# Patient Record
Sex: Female | Born: 1950 | Race: White | Hispanic: No | Marital: Married | Smoking: Former smoker
Health system: Southern US, Community
[De-identification: ages and names within clinical notes are randomized; demographics above are authoritative.]

## PROBLEM LIST (undated history)

## (undated) DIAGNOSIS — G35 Multiple sclerosis: Secondary | ICD-10-CM

## (undated) DIAGNOSIS — R55 Syncope and collapse: Secondary | ICD-10-CM

## (undated) DIAGNOSIS — I5030 Unspecified diastolic (congestive) heart failure: Secondary | ICD-10-CM

## (undated) DIAGNOSIS — G43909 Migraine, unspecified, not intractable, without status migrainosus: Secondary | ICD-10-CM

## (undated) DIAGNOSIS — G4730 Sleep apnea, unspecified: Secondary | ICD-10-CM

## (undated) DIAGNOSIS — R911 Solitary pulmonary nodule: Secondary | ICD-10-CM

## (undated) DIAGNOSIS — R079 Chest pain, unspecified: Secondary | ICD-10-CM

## (undated) DIAGNOSIS — H269 Unspecified cataract: Secondary | ICD-10-CM

## (undated) DIAGNOSIS — M1990 Unspecified osteoarthritis, unspecified site: Secondary | ICD-10-CM

## (undated) DIAGNOSIS — M353 Polymyalgia rheumatica: Secondary | ICD-10-CM

## (undated) DIAGNOSIS — G709 Myoneural disorder, unspecified: Secondary | ICD-10-CM

## (undated) DIAGNOSIS — G35D Multiple sclerosis, unspecified: Secondary | ICD-10-CM

## (undated) HISTORY — DX: Unspecified osteoarthritis, unspecified site: M19.90

## (undated) HISTORY — DX: Multiple sclerosis, unspecified: G35.D

## (undated) HISTORY — DX: Unspecified diastolic (congestive) heart failure: I50.30

## (undated) HISTORY — DX: Chest pain, unspecified: R07.9

## (undated) HISTORY — PX: TUBAL LIGATION: SHX77

## (undated) HISTORY — DX: Unspecified cataract: H26.9

## (undated) HISTORY — PX: BREAST EXCISIONAL BIOPSY: SUR124

## (undated) HISTORY — PX: EYE SURGERY: SHX253

## (undated) HISTORY — DX: Syncope and collapse: R55

## (undated) HISTORY — DX: Sleep apnea, unspecified: G47.30

## (undated) HISTORY — DX: Solitary pulmonary nodule: R91.1

## (undated) HISTORY — PX: CARDIAC CATHETERIZATION: SHX172

## (undated) HISTORY — PX: FRACTURE SURGERY: SHX138

## (undated) HISTORY — PX: TENDON REPAIR: SHX5111

## (undated) HISTORY — DX: Polymyalgia rheumatica: M35.3

## (undated) HISTORY — DX: Migraine, unspecified, not intractable, without status migrainosus: G43.909

## (undated) HISTORY — DX: Multiple sclerosis: G35

---

## 1988-10-01 HISTORY — PX: BREAST SURGERY: SHX581

## 2014-10-07 LAB — HM MAMMOGRAPHY: HM MAMMO: NEGATIVE

## 2014-12-05 ENCOUNTER — Observation Stay: Payer: Self-pay | Admitting: Internal Medicine

## 2015-01-30 NOTE — Discharge Summary (Signed)
PATIENT NAME:  Morgan Herrera, Morgan Herrera MR#:  [id number] DATE OF BIRTH:  03/21/1951  DATE OF ADMISSION:  12/05/2014 DATE OF DISCHARGE:  12/06/2014  PRESENTING COMPLAINT: Cat bite with swelling of the left middle finger.    DISCHARGE DIAGNOSES:   1.  Left hand cat bite of left hand with cellulitis.  2.  History of multiple sclerosis.   CONDITION ON DISCHARGE: Fair.  MEDICATIONS:   1.  Augmentin 875 b.i.d.  2.  Tecfidera 240 mg delayed release b.i.d.  3.  Prednisone 1 mg daily.   FOLLOWUP: With urgent care as needed. Keep left hand elevated. If redness increases, return back to emergency room.   LABORATORY DATA:    1.  White count is 13.4.    2.  Blood cultures negative.   BRIEF SUMMARY OF HOSPITAL COURSE: Ms. Raffo is a 64 year old Caucasian female with past medical history of multiple sclerosis, comes to the emergency room with: 1.  Cat bite on left hand with cellulitis and swelling. The patient was admitted on the medical floor, started on IV Unasyn. The patient was advised to keep her left hand elevated. Swelling improved. Redness also improved. The patient did not have any fever; her counts remained stable. She will finish up a course of Augmentin as outpatient and follow up to ER or urgent care as needed.  2.  History of MS. Her prednisone and Tecfidera were continued. Hospital stay otherwise remained stable.   CODE STATUS: The patient remained a full code.   TIME SPENT: 40 minutes.    ____________________________ Hart Rochester Posey Pronto, MD sap:tr D: 12/11/2014 15:22:00 ET T: 12/11/2014 16:03:49 ET JOB#: [id number]  cc: Clova Morlock A. Posey Pronto, MD, <Dictator> Ilda Basset MD ELECTRONICALLY SIGNED 12/12/2014 18:21

## 2015-01-30 NOTE — H&P (Signed)
PATIENT NAME:  Morgan Herrera, TAKEMOTO MR#:  [id number] DATE OF BIRTH:  [birth year]  DATE OF ADMISSION:  12/05/2014  CHIEF COMPLAINT: Cat bite to the left hand.   HISTORY OF PRESENT ILLNESS: This is a 64 year old female patient who is in the process of moving here to [place] from [place]. She was getting ready to get on her flight this morning. Around 7:00 a.m. she was putting her cat in the travel carrier and the cat was struggling with her, bit her left hand a couple of times, notably on her left middle finger as well as her left small finger. The patient was in the airport on flights traveling here all day. She said that she noted significant increasing pain and redness and swelling that started around 10:00 to 10:30 a.m. and then by the time she got here this afternoon, this had increased. She came into the ED and was evaluated for the same. She does have some limited range of motion with the swelling in that hand. No other complaints. In the ED, her workup was relatively normal. It showed only slight increase in her white blood cell count, 13.4. Her other labs were okay and largely within normal limits. The ED provider called orthopedics and they recommended that the patient be admitted overnight for up to 24 hours' worth of IV antibiotics for this problem.  PRIMARY CARE PHYSICIAN: The patient has just moved here so she does not have an established primary care physician at this time.   PAST MEDICAL HISTORY: Multiple sclerosis. The patient states this is mild; polymyalgia rheumatica; and some mild insomnia.  CURRENT MEDICATIONS: Prednisone 1 mg daily; Prempro every other day, the patient states she is trying to wean off of this; Lasix 10 mg daily; Tecfidera 240 mg b.i.d.; Ambien 2.5 mg at bedtime.   PAST SURGICAL HISTORY: Retina repair, right elbow tendon repair, right leg fracture repair.   ALLERGIES: No known drug allergies.   FAMILY HISTORY: Mother had Parkinson's and coronary artery  disease. Father had asthma.   SOCIAL HISTORY: The patient is an ex-smoker. She quit smoking 16 years ago. She drinks 1 glass of wine with dinner daily and denies illicit drug use.  REVIEW OF SYSTEMS:  CONSTITUTIONAL: Denies fever, fatigue, weakness.  EYES: Denies blurred or double vision, pain or redness.  EAR, NOSE, AND THROAT: Denies ear pain, hearing loss, difficulty swallowing.  RESPIRATORY: Endorses mild cough. Denies wheeze or dyspnea. Endorses mild laryngitis.  CARDIOVASCULAR: Denies chest pain, orthopnea, edema, or palpitations.  GASTROINTESTINAL: Denies nausea, vomiting, diarrhea, abdominal pain, or constipation.  GENITOURINARY: Denies dysuria, hematuria, frequency.  ENDOCRINE: Denies nocturia, thyroid problems, heat or cold intolerance. HEMATOLOGIC AND LYMPHATIC: Denies easy bruising, bleeding, or swollen glands.  INTEGUMENTARY: Denies acne lesions. Endorses left hand cellulitis around the areas of the cat bites, most notably the left middle finger and left pinky finger, as well as some swelling in those fingers.  MUSCULOSKELETAL: Denies arthritis, swelling, or gout.  NEUROLOGICAL: Denies numbness, weakness, headache.  PSYCHIATRIC: Denies anxiety, depression. Endorses some mild, stable insomnia.   PHYSICAL EXAMINATION:  CONSTITUTIONAL: Blood pressure 137/79, pulse 63, temperature 98.3, respirations 20, with 98% oxygen saturations on room air.  GENERAL: This is a well-nourished female supine in bed, in no apparent distress.  HEENT: Pupils equal, round, react to light and accommodation. Extraocular movements intact. No scleral icterus. Moist mucosal membranes.  NECK: Thyroid is not enlarged.  NECK: Supple with no masses. Nontender. No cervical adenopathy. No JVD.  RESPIRATORY: Clear to  auscultation bilaterally with no rales, rhonchi, or wheezes. No respiratory distress.  CARDIOVASCULAR: Regular rate and rhythm. No murmurs, rubs, or gallops auscultated. Good pedal pulses. No lower  extremity edema.  ABDOMEN: Soft, nontender, nondistended, with good bowel sounds and no hepatosplenomegaly.  MUSCULOSKELETAL: Muscular strength 5/5 with full spontaneous range of motion throughout all extremities. No distal clubbing or cyanosis. She does have swelling in her left middle finger and her left pinky finger. She does have 2 puncture bite marks on the left distal interphalangeal joint of the middle finger and also the distal interphalangeal joint of the pinky finger.  SKIN: She does have some erythema and cellulitis around those bite marks on her left hand and fingers. No other rash or lesions. Otherwise, skin is warm, dry, and intact.  LYMPHATIC: No adenopathy.  NEUROLOGIC: Cranial nerves intact. Sensation intact throughout. No dysarthria or aphasia.  PSYCHIATRIC: Alert and oriented x 3 with good judgment and insight; cooperative.   LABORATORY DATA: White blood count 13.4, hemoglobin 11.6, hematocrit 36.3, platelets 278,000. Sodium 142, potassium 3.8, chloride 105, bicarbonate 28, BUN 22, creatinine 0.54, glucose 104.   RADIOLOGY: No radiology to report.   ASSESSMENT AND PLAN: 1.  Cat bite to the left hand. The patient will be given intravenous Unasyn through tonight and into tomorrow, and her bite will be reassessed at that time to see if she can be transitioned to oral antibiotics.  2.  Multiple sclerosis. The patient reports that she is on Tecfidera. She also states that she wishes to utilize her own medications here in the hospital. We will admit her to observation overnight and, per nursing protocol, she may use her home medications if that is allowable here.  3.  Polymyalgia rheumatica. The patient states she is on the 1 mg of prednisone daily for this. We will continue that here inpatient, again using her own medication if that is allowed per protocol.  4.  Deep vein thrombosis prophylaxis. Mechanical sequential compression devices only.   CODE STATUS: This patient is full  code.  TIME SPENT ON THIS ADMISSION: 45 minutes.   ____________________________ Wilford Corner. Jannifer Franklin, MD dfw:ST D: 12/06/2014 00:03:55 ET T: 12/06/2014 01:18:55 ET JOB#: [id number]  cc: Wilford Corner. Jannifer Franklin, MD, <Dictator> Janaia Kozel Fawn Kirk MD ELECTRONICALLY SIGNED 12/06/2014 3:02

## 2015-02-11 ENCOUNTER — Encounter: Payer: Self-pay | Admitting: Primary Care

## 2015-02-11 ENCOUNTER — Ambulatory Visit (INDEPENDENT_AMBULATORY_CARE_PROVIDER_SITE_OTHER): Payer: 59 | Admitting: Primary Care

## 2015-02-11 VITALS — BP 116/72 | HR 49 | Temp 97.7°F | Ht 65.75 in | Wt 112.4 lb

## 2015-02-11 DIAGNOSIS — G35 Multiple sclerosis: Secondary | ICD-10-CM | POA: Diagnosis not present

## 2015-02-11 DIAGNOSIS — E8770 Fluid overload, unspecified: Secondary | ICD-10-CM | POA: Diagnosis not present

## 2015-02-11 DIAGNOSIS — F329 Major depressive disorder, single episode, unspecified: Secondary | ICD-10-CM | POA: Diagnosis not present

## 2015-02-11 DIAGNOSIS — R609 Edema, unspecified: Secondary | ICD-10-CM

## 2015-02-11 DIAGNOSIS — R600 Localized edema: Secondary | ICD-10-CM | POA: Diagnosis not present

## 2015-02-11 DIAGNOSIS — F32A Depression, unspecified: Secondary | ICD-10-CM | POA: Insufficient documentation

## 2015-02-11 MED ORDER — FUROSEMIDE 20 MG PO TABS: 20.0000 mg | ORAL_TABLET | Freq: Every day | ORAL | Status: DC

## 2015-02-11 NOTE — Assessment & Plan Note (Signed)
Diagnosed 14 years ago. Taking Tecfederia. Has some itching and rash to face. Manages with Dr. Lanae Boast in [place], [place].

## 2015-02-11 NOTE — Assessment & Plan Note (Signed)
Diagnosed 3 years ago. Managed on Lexapro 5 mg daily, feels well managed.  Denies SI/HI

## 2015-02-11 NOTE — Progress Notes (Signed)
Subjective:    Patient ID: Morgan Herrera, female    DOB: 26-Oct-1950, 64 y.o.   MRN: [id number]  HPI  Ms. Morgan Herrera is a 64 year old female who presents today to establish care and discuss the problems mentioned below. Will obtain old records.  1) Multiple Sclerosis: Mild. Diagnosed by CT scan about 14 years ago after presenting with optic neritis. Symptoms include tingling and itching to her face recently. Previous symptoms include tingling, fatigue, weakness. The symptoms will occur and pass within 1-2 weeks. Follows with Morgan Herrera in [place]. She is taking Tecfederia.   2) Polymyalgia Rheumatica: Diagnosed 4 years ago, was placed on steroids with resolve. Minor flare up's occasionally as she is working to taper off prednisone. She is currently taking 1 mg every other day. Following with Dr. Rush Herrera in [place], Rheumatology.  3) Depression: Diagnosed 3 years ago after her husband retired. She was seeing a therapist which helped her to work through some issues. She was placed on Lexapro 10  Mg tablets and is taking 1/2 tablet daily. She feels as though she's managed well and would like to continue her Lexapro.  4) Postmenopausal: Taking Prempro tablets, 1/2 every third day. She has been taking for 10 years and is slowly weaning herself off.  5) Water retention: Taking daily Lasix 20 mg for the past 5 years. Fluid retention in ankles. legs, and face occasionally. No known heart failure.   Review of Systems  Constitutional: Negative for unexpected weight change.  HENT:       Intermittent rhinorrhea.   Respiratory: Negative for cough and shortness of breath.   Cardiovascular: Negative for chest pain.  Gastrointestinal: Negative for diarrhea.       Constipation: bowel movements are once every 3 days.  Genitourinary: Negative for dysuria.  Musculoskeletal:       History of MS and PMR  Skin: Negative for rash.  Neurological: Negative for dizziness and headaches.    Tingling intermittently from MS.  Psychiatric/Behavioral:       See HPI       Past Medical History  Diagnosis Date  . Migraine   . PMR (polymyalgia rheumatica)   . Multiple sclerosis     History   Social History  . Marital Status: Married    Spouse Name: N/A  . Number of Children: N/A  . Years of Education: N/A   Occupational History  . Not on file.   Social History Main Topics  . Smoking status: Former Research scientist (life sciences)  . Smokeless tobacco: Not on file  . Alcohol Use: 0.0 oz/week    0 Standard drinks or equivalent per week     Comment: 1 to 2 glass of wine at least 4 nights a week  . Drug Use: No  . Sexual Activity: Not on file   Other Topics Concern  . Not on file   Social History Narrative   Married.   Moved here from [place] after 35 years   Enjoys arts and crafts. Taking a painting class.   Walking with her spouse.    Reading, shopping.    Past Surgical History  Procedure Laterality Date  . Breast surgery  1990  . Eye surgery      1978 1st of 3 eye surgeries  . Tendon repair      Family History  Problem Relation Age of Onset  . Heart disease Mother   . Hypertension Mother   . Cancer Maternal Grandmother  breast  . Parkinson's disease Mother   . Asthma Father     No Known Allergies  No current outpatient prescriptions on file prior to visit.   No current facility-administered medications on file prior to visit.    BP 116/72 mmHg  Pulse 49  Temp(Src) 97.7 F (36.5 C) (Oral)  Ht 5' 5.75" (1.67 m)  Wt 112 lb 6.4 oz (50.984 kg)  BMI 18.28 kg/m2  SpO2 99%    Objective:   Physical Exam  Constitutional: She is oriented to person, place, and time. She appears well-developed.  HENT:  Right Ear: Tympanic membrane and ear canal normal.  Left Ear: Tympanic membrane and ear canal normal.  Nose: Nose normal.  Mouth/Throat: Oropharynx is clear and moist.  Eyes: Conjunctivae and EOM are normal. Pupils are equal, round, and reactive to light.    Neck: Neck supple.  Cardiovascular: Normal rate and regular rhythm.   Pulmonary/Chest: Effort normal and breath sounds normal.  Abdominal: Soft. Bowel sounds are normal.  Musculoskeletal: Normal range of motion.  Lymphadenopathy:    She has no cervical adenopathy.  Neurological: She is alert and oriented to person, place, and time. She has normal reflexes.  Skin: Skin is warm and dry.  Psychiatric: She has a normal mood and affect.          Assessment & Plan:  30 minutes spent on new patient visit.

## 2015-02-11 NOTE — Patient Instructions (Signed)
Refills of your Lasix have been sent to your pharmacy.  Try Debrox drops over the counter for ear wax overproduction. Please schedule a physical with me in the next 1-2 months. You will also schedule a lab only appointment one week prior. We will discuss your lab results during your physical. It was a pleasure to meet you today! Please don't hesitate to call me with any questions. Welcome to [facility]!

## 2015-02-11 NOTE — Assessment & Plan Note (Signed)
Takes daily Lasix 20 mg and has been on for 5 years. Refills provided today. No edema present today.

## 2015-02-24 ENCOUNTER — Other Ambulatory Visit: Payer: Self-pay | Admitting: Primary Care

## 2015-02-24 DIAGNOSIS — R600 Localized edema: Secondary | ICD-10-CM

## 2015-02-24 DIAGNOSIS — Z0000 Encounter for general adult medical examination without abnormal findings: Secondary | ICD-10-CM

## 2015-02-24 DIAGNOSIS — Z780 Asymptomatic menopausal state: Secondary | ICD-10-CM

## 2015-03-01 ENCOUNTER — Other Ambulatory Visit (INDEPENDENT_AMBULATORY_CARE_PROVIDER_SITE_OTHER): Payer: 59

## 2015-03-01 DIAGNOSIS — R600 Localized edema: Secondary | ICD-10-CM

## 2015-03-01 DIAGNOSIS — Z0000 Encounter for general adult medical examination without abnormal findings: Secondary | ICD-10-CM

## 2015-03-01 DIAGNOSIS — Z780 Asymptomatic menopausal state: Secondary | ICD-10-CM

## 2015-03-01 LAB — CBC WITH DIFFERENTIAL/PLATELET
BASOS ABS: 0.1 10*3/uL (ref 0.0–0.1)
Basophils Relative: 1.2 % (ref 0.0–3.0)
EOS PCT: 2 % (ref 0.0–5.0)
Eosinophils Absolute: 0.1 10*3/uL (ref 0.0–0.7)
HCT: 39.6 % (ref 36.0–46.0)
Hemoglobin: 13.1 g/dL (ref 12.0–15.0)
Lymphocytes Relative: 11.3 % — ABNORMAL LOW (ref 12.0–46.0)
Lymphs Abs: 0.5 10*3/uL — ABNORMAL LOW (ref 0.7–4.0)
MCHC: 33.2 g/dL (ref 30.0–36.0)
MCV: 86.9 fl (ref 78.0–100.0)
MONO ABS: 0.5 10*3/uL (ref 0.1–1.0)
MONOS PCT: 9.9 % (ref 3.0–12.0)
NEUTROS PCT: 75.6 % (ref 43.0–77.0)
Neutro Abs: 3.6 10*3/uL (ref 1.4–7.7)
PLATELETS: 230 10*3/uL (ref 150.0–400.0)
RBC: 4.56 Mil/uL (ref 3.87–5.11)
RDW: 14.4 % (ref 11.5–15.5)
WBC: 4.8 10*3/uL (ref 4.0–10.5)

## 2015-03-01 LAB — COMPREHENSIVE METABOLIC PANEL
ALK PHOS: 51 U/L (ref 39–117)
ALT: 28 U/L (ref 0–35)
AST: 27 U/L (ref 0–37)
Albumin: 4.6 g/dL (ref 3.5–5.2)
BILIRUBIN TOTAL: 0.7 mg/dL (ref 0.2–1.2)
BUN: 20 mg/dL (ref 6–23)
CHLORIDE: 104 meq/L (ref 96–112)
CO2: 31 mEq/L (ref 19–32)
Calcium: 9.6 mg/dL (ref 8.4–10.5)
Creatinine, Ser: 0.61 mg/dL (ref 0.40–1.20)
GFR: 105.13 mL/min (ref >60.00)
Glucose, Bld: 88 mg/dL (ref 70–99)
POTASSIUM: 3.8 meq/L (ref 3.5–5.1)
Sodium: 140 mEq/L (ref 135–145)
Total Protein: 7.1 g/dL (ref 6.0–8.3)

## 2015-03-01 LAB — LIPID PANEL
CHOL/HDL RATIO: 2
CHOLESTEROL: 221 mg/dL — AB (ref 0–200)
HDL: 103 mg/dL (ref >39.00)
LDL CALC: 107 mg/dL — AB (ref 0–99)
NonHDL: 118
TRIGLYCERIDES: 56 mg/dL (ref 0.0–149.0)
VLDL: 11.2 mg/dL (ref 0.0–40.0)

## 2015-03-01 LAB — VITAMIN D 25 HYDROXY (VIT D DEFICIENCY, FRACTURES): VITD: 13.99 ng/mL — ABNORMAL LOW (ref 30.00–100.00)

## 2015-03-01 LAB — HEMOGLOBIN A1C: Hgb A1c MFr Bld: 5.2 % (ref 4.6–6.5)

## 2015-03-01 LAB — TSH: TSH: 1.42 u[IU]/mL (ref 0.35–4.50)

## 2015-03-03 ENCOUNTER — Encounter: Payer: 59 | Admitting: Primary Care

## 2015-03-04 ENCOUNTER — Ambulatory Visit (INDEPENDENT_AMBULATORY_CARE_PROVIDER_SITE_OTHER): Payer: 59 | Admitting: Primary Care

## 2015-03-04 ENCOUNTER — Encounter: Payer: Self-pay | Admitting: Primary Care

## 2015-03-04 ENCOUNTER — Encounter: Payer: Self-pay | Admitting: *Deleted

## 2015-03-04 VITALS — BP 114/70 | HR 52 | Temp 97.4°F | Ht 66.0 in | Wt 113.4 lb

## 2015-03-04 DIAGNOSIS — E559 Vitamin D deficiency, unspecified: Secondary | ICD-10-CM | POA: Diagnosis not present

## 2015-03-04 DIAGNOSIS — Z0000 Encounter for general adult medical examination without abnormal findings: Secondary | ICD-10-CM | POA: Diagnosis not present

## 2015-03-04 DIAGNOSIS — G35 Multiple sclerosis: Secondary | ICD-10-CM

## 2015-03-04 DIAGNOSIS — Z1211 Encounter for screening for malignant neoplasm of colon: Secondary | ICD-10-CM

## 2015-03-04 DIAGNOSIS — F32A Depression, unspecified: Secondary | ICD-10-CM

## 2015-03-04 DIAGNOSIS — F329 Major depressive disorder, single episode, unspecified: Secondary | ICD-10-CM

## 2015-03-04 DIAGNOSIS — Z0001 Encounter for general adult medical examination with abnormal findings: Secondary | ICD-10-CM | POA: Insufficient documentation

## 2015-03-04 MED ORDER — VITAMIN D (ERGOCALCIFEROL) 1.25 MG (50000 UNIT) PO CAPS: ORAL_CAPSULE | ORAL | Status: DC

## 2015-03-04 NOTE — Progress Notes (Signed)
Pre visit review using our clinic review tool, if applicable. No additional management support is needed unless otherwise documented below in the visit note. 

## 2015-03-04 NOTE — Patient Instructions (Addendum)
You will be contacted regarding your referral to GI for your colonoscopy and Bone Density testing.  Please let us know if you have not heard back within one week.  Start Vitamin D capsules. Take 1 capsule by mouth once weekly for 12 weeks. We will re-check your levels then.  Ensure your are getting at least 1000 mg-1200 mg of calcium daily Your labs look great overall!  Follow up in 12 weeks for re-evaluation. It was nice seeing you!

## 2015-03-04 NOTE — Progress Notes (Signed)
Subjective:    Patient ID: Morgan Herrera, female    DOB: 04/08/1951, 64 y.o.   MRN: [id number]  HPI  Morgan Herrera is a 64 year old female who presents today for complete physical.  Immunizations: -Tetanus: Completed in March 2016 -Influenza: Did not complete last season -Pneumonia: Never completed -Shingles: Completed in 2012  Diet: Chicken salad/tuna salad with crackers, meat and vegetables for dinner. 2 glasses of wine. Small dessert after dinner. Exercise: She and her husband walk 3-4 days a week 2 miles. Eye exam: Completed within the past year. Dental exam: Completed in January 2016.  Colonoscopy: Completed last at the age of 59. Due now. Dexa: Completed over 2 years ago.  Pap Smear: Pap completed in 2015 Mammogram: Completed in 2015   Review of Systems  Constitutional: Negative for unexpected weight change.  HENT: Negative for rhinorrhea.   Respiratory: Negative for cough and shortness of breath.   Cardiovascular: Negative for chest pain.  Genitourinary: Negative for dysuria and frequency.  Musculoskeletal:       Bilateral knee discomfort. Neck stiffness with headaches.  Skin: Negative for rash.  Neurological: Negative for dizziness.       History of MS. Decreased overall strength. Frequent headaches. Once every 2 weeks. Will decrease with ibuprofen.  Psychiatric/Behavioral:       Denies concerns of anxiety or depression       Past Medical History  Diagnosis Date  . Migraine   . PMR (polymyalgia rheumatica)   . Multiple sclerosis     History   Social History  . Marital Status: Married    Spouse Name: N/A  . Number of Children: N/A  . Years of Education: N/A   Occupational History  . Not on file.   Social History Main Topics  . Smoking status: Former Research scientist (life sciences)  . Smokeless tobacco: Not on file  . Alcohol Use: 0.0 oz/week    0 Standard drinks or equivalent per week     Comment: 1 to 2 glass of wine at least 4 nights a week  . Drug Use: No  .  Sexual Activity: Not on file   Other Topics Concern  . Not on file   Social History Narrative   Married.   Moved here from [place] after 35 years   Enjoys arts and crafts. Taking a painting class.   Walking with her spouse.    Reading, shopping.    Past Surgical History  Procedure Laterality Date  . Breast surgery  1990  . Eye surgery      1978 1st of 3 eye surgeries  . Tendon repair      Family History  Problem Relation Age of Onset  . Heart disease Mother   . Hypertension Mother   . Cancer Maternal Grandmother     breast  . Parkinson's disease Mother   . Asthma Father     No Known Allergies  Current Outpatient Prescriptions on File Prior to Visit  Medication Sig Dispense Refill  . Dimethyl Fumarate 240 MG CPDR Take 240 mg by mouth 2 (two) times daily.    Marland Kitchen escitalopram (LEXAPRO) 10 MG tablet Take 10 mg by mouth daily. Patient take 1/2 a tab daily    . estrogen, conjugated,-medroxyprogesterone (PREMPRO) 0.3-1.5 MG per tablet Take 1 tablet by mouth daily. Take 1/2 tablet every 2 days.    . furosemide (LASIX) 20 MG tablet Take 1 tablet (20 mg total) by mouth daily. 30 tablet 5  . predniSONE (DELTASONE)  1 MG tablet Take 1 mg by mouth daily with breakfast. Take 1 tablet every other day.    . zolpidem (AMBIEN) 10 MG tablet Take 10 mg by mouth at bedtime as needed for sleep. Take 1/2 to 1 tablet at bedtime     No current facility-administered medications on file prior to visit.    BP 114/70 mmHg  Pulse 52  Temp(Src) 97.4 F (36.3 C) (Oral)  Ht 5\' 6"  (1.676 m)  Wt 113 lb 6.4 oz (51.438 kg)  BMI 18.31 kg/m2  SpO2 97%    Objective:   Physical Exam  Constitutional: She is oriented to person, place, and time. She appears well-nourished.  HENT:  Right Ear: Tympanic membrane and ear canal normal.  Left Ear: Tympanic membrane and ear canal normal.  Nose: Nose normal.  Mouth/Throat: Oropharynx is clear and moist.  Right ear impacted with cerumen. Irrigation  performed by CMA. TM's WNL.  Eyes: Conjunctivae and EOM are normal. Pupils are equal, round, and reactive to light.  Neck: Neck supple. No thyromegaly present.  Cardiovascular: Normal rate and regular rhythm.   Pulmonary/Chest: Effort normal and breath sounds normal.  Abdominal: Soft. Bowel sounds are normal. She exhibits no mass. There is no tenderness.  Musculoskeletal: Normal range of motion.  Strength is weak but equal bilaterally. History of MS.  Lymphadenopathy:    She has no cervical adenopathy.  Neurological: She is alert and oriented to person, place, and time. No cranial nerve deficit.  Reflex Scores:      Patellar reflexes are 3+ on the right side and 3+ on the left side. Skin: Skin is warm and dry.  Psychiatric: She has a normal mood and affect.          Assessment & Plan:

## 2015-03-04 NOTE — Assessment & Plan Note (Signed)
Tetanus up to date. Needs pneumovax. Zostavax in 2012. Healthy diet and exercises 3 days weekly. Due for colonoscopy and Dexa scan. Referrals made today. Pap and mammogram up to date. Labs unremarkable except for Vitamin D. Started 50,000 units once weekly for 12 weeks. Start calcium OTC. Follow up in 12 weeks

## 2015-03-04 NOTE — Addendum Note (Signed)
Addended by: Pleas Koch on: 03/04/2015 11:54 AM   Modules accepted: Miquel Dunn

## 2015-03-04 NOTE — Assessment & Plan Note (Addendum)
Stable. Overall weakness but no acute changes. Decreased facial flushing when taking aspirin with meds. Follow up with Dr. Loanne Drilling is scheduled this summer.

## 2015-03-04 NOTE — Assessment & Plan Note (Signed)
Level of 13.9 Started 50,000 units weekly x 12 weeks. Dexa scan ordered. Start Calcium consumption OTC. Follow up in 12 weeks.

## 2015-03-04 NOTE — Assessment & Plan Note (Signed)
Stable.  Well controlled. Continue same.

## 2015-03-15 ENCOUNTER — Ambulatory Visit
Admission: RE | Admit: 2015-03-15 | Discharge: 2015-03-15 | Disposition: A | Discharge status: Home / Self Care | Payer: 59 | Source: Ambulatory Visit | Attending: Primary Care | Admitting: Primary Care

## 2015-03-15 DIAGNOSIS — E559 Vitamin D deficiency, unspecified: Secondary | ICD-10-CM

## 2015-03-29 ENCOUNTER — Encounter: Payer: Self-pay | Admitting: Primary Care

## 2015-05-10 ENCOUNTER — Telehealth: Payer: Self-pay | Admitting: *Deleted

## 2015-05-10 NOTE — Telephone Encounter (Signed)
Mammogram f/u call: left voicemail requesting pt to call office back so we can check status of mammogram

## 2015-05-11 ENCOUNTER — Telehealth: Payer: Self-pay | Admitting: Primary Care

## 2015-05-11 ENCOUNTER — Encounter: Payer: Self-pay | Admitting: Primary Care

## 2015-05-11 NOTE — Telephone Encounter (Signed)
Update in health maintenance  and abstract mammogram results. Send results to scan.

## 2015-05-11 NOTE — Telephone Encounter (Signed)
See regarding mammogram. Am i supposed to document this somewhere? I'm not making these calls, so I'm not sure why they are coming to me. Thanks.

## 2015-05-11 NOTE — Telephone Encounter (Signed)
Patient returned call about her Mammogram.  She said she had her mammogram done last fall in [place].  Patient said she'll drop off a copy of her mammogram results.

## 2015-05-18 ENCOUNTER — Encounter: Payer: Self-pay | Admitting: *Deleted

## 2015-05-19 ENCOUNTER — Encounter
Admission: RE | Disposition: A | Discharge status: Home Health | Payer: Self-pay | Source: Ambulatory Visit | Attending: Gastroenterology

## 2015-05-19 ENCOUNTER — Ambulatory Visit: Payer: 59 | Admitting: Anesthesiology

## 2015-05-19 ENCOUNTER — Ambulatory Visit
Admission: RE | Admit: 2015-05-19 | Discharge: 2015-05-19 | Disposition: A | Discharge status: Home Health | Payer: 59 | Source: Ambulatory Visit | Attending: Gastroenterology | Admitting: Gastroenterology

## 2015-05-19 ENCOUNTER — Encounter: Payer: Self-pay | Admitting: Anesthesiology

## 2015-05-19 DIAGNOSIS — M353 Polymyalgia rheumatica: Secondary | ICD-10-CM | POA: Diagnosis not present

## 2015-05-19 DIAGNOSIS — Z1211 Encounter for screening for malignant neoplasm of colon: Secondary | ICD-10-CM | POA: Diagnosis not present

## 2015-05-19 DIAGNOSIS — Z7982 Long term (current) use of aspirin: Secondary | ICD-10-CM | POA: Diagnosis not present

## 2015-05-19 DIAGNOSIS — Z79899 Other long term (current) drug therapy: Secondary | ICD-10-CM | POA: Diagnosis not present

## 2015-05-19 DIAGNOSIS — R51 Headache: Secondary | ICD-10-CM | POA: Diagnosis not present

## 2015-05-19 DIAGNOSIS — G35 Multiple sclerosis: Secondary | ICD-10-CM | POA: Diagnosis not present

## 2015-05-19 DIAGNOSIS — Z87891 Personal history of nicotine dependence: Secondary | ICD-10-CM | POA: Insufficient documentation

## 2015-05-19 HISTORY — PX: COLONOSCOPY WITH PROPOFOL: SHX5780

## 2015-05-19 HISTORY — DX: Myoneural disorder, unspecified: G70.9

## 2015-05-19 SURGERY — COLONOSCOPY WITH PROPOFOL
Anesthesia: General

## 2015-05-19 MED ORDER — SODIUM CHLORIDE 0.9 % IV SOLN
INTRAVENOUS | Status: DC
  Administered 2015-05-19: 13:00:00 via INTRAVENOUS

## 2015-05-19 MED ORDER — SODIUM CHLORIDE 0.9 % IV SOLN: INTRAVENOUS | Status: DC

## 2015-05-19 MED ORDER — PROPOFOL INFUSION 10 MG/ML OPTIME
INTRAVENOUS | Status: DC | PRN
  Administered 2015-05-19 (×2): 120 ug/kg/min via INTRAVENOUS

## 2015-05-19 MED ORDER — FENTANYL CITRATE (PF) 100 MCG/2ML IJ SOLN
INTRAMUSCULAR | Status: DC | PRN
  Administered 2015-05-19: 50 ug via INTRAVENOUS

## 2015-05-19 MED ORDER — MIDAZOLAM HCL 2 MG/2ML IJ SOLN
INTRAMUSCULAR | Status: DC | PRN
  Administered 2015-05-19: 1 mg via INTRAVENOUS

## 2015-05-19 NOTE — Anesthesia Procedure Notes (Signed)
Performed by: COOK-MARTIN, Luzelena Heeg Pre-anesthesia Checklist: Patient identified, Emergency Drugs available, Suction available, Patient being monitored and Timeout performed Patient Re-evaluated:Patient Re-evaluated prior to inductionOxygen Delivery Method: Nasal cannula Preoxygenation: Pre-oxygenation with 100% oxygen Intubation Type: IV induction Placement Confirmation: positive ETCO2 and CO2 detector       

## 2015-05-19 NOTE — H&P (Signed)
Primary Care Physician:  Morgan Flow, NP Primary Gastroenterologist:  Dr. Candace Herrera  Pre-Procedure History & Physical: HPI:  Morgan Herrera is a 64 y.o. female is here for an colonoscopy.  Past Medical History  Diagnosis Date  . Migraine   . PMR (polymyalgia rheumatica)   . Multiple sclerosis   . Neuromuscular disorder     Past Surgical History  Procedure Laterality Date  . Breast surgery  1990  . Eye surgery      1978 1st of 3 eye surgeries  . Tendon repair      Prior to Admission medications   Medication Sig Start Date End Date Taking? Authorizing Provider  aspirin 81 MG tablet Take 81 mg by mouth daily.   Yes Historical Provider, MD  Dimethyl Fumarate 240 MG CPDR Take 240 mg by mouth 2 (two) times daily.   Yes Historical Provider, MD  escitalopram (LEXAPRO) 10 MG tablet Take 10 mg by mouth daily. Patient take 1/2 a tab daily   Yes Historical Provider, MD  estrogen, conjugated,-medroxyprogesterone (PREMPRO) 0.3-1.5 MG per tablet Take 1 tablet by mouth daily. Take 1/2 tablet every 2 days.   Yes Historical Provider, MD  furosemide (LASIX) 20 MG tablet Take 1 tablet (20 mg total) by mouth daily. 02/11/15  Yes Pleas Koch, NP  predniSONE (DELTASONE) 1 MG tablet Take 1 mg by mouth daily with breakfast. Take 1 tablet every other day.   Yes Historical Provider, MD  Vitamin D, Ergocalciferol, (DRISDOL) 50000 UNITS CAPS capsule Take 1 capsule by mouth once weekly for 12 weeks. 03/04/15  Yes Pleas Koch, NP  zolpidem (AMBIEN) 10 MG tablet Take 10 mg by mouth at bedtime as needed for sleep. Take 1/2 to 1 tablet at bedtime   Yes Historical Provider, MD    Allergies as of 04/12/2015  . (No Known Allergies)    Family History  Problem Relation Age of Onset  . Heart disease Mother   . Hypertension Mother   . Cancer Maternal Grandmother     breast  . Parkinson's disease Mother   . Asthma Father     Social History   Social History  . Marital Status: Married   Spouse Name: N/A  . Number of Children: N/A  . Years of Education: N/A   Occupational History  . Not on file.   Social History Main Topics  . Smoking status: Former Research scientist (life sciences)  . Smokeless tobacco: Not on file  . Alcohol Use: 0.0 oz/week    0 Standard drinks or equivalent per week     Comment: 1 to 2 glass of wine at least 4 nights a week  . Drug Use: No  . Sexual Activity: Not on file   Other Topics Concern  . Not on file   Social History Narrative   Married.   Moved here from [place] after 35 years   Enjoys arts and crafts. Taking a painting class.   Walking with her spouse.    Reading, shopping.    Review of Systems: See HPI, otherwise negative ROS  Physical Exam: BP 129/71 mmHg  Pulse 52  Temp(Src) 96.8 F (36 C) (Tympanic)  Resp 16  Ht 5\' 6"  (1.676 m)  Wt 50.803 kg (112 lb)  BMI 18.09 kg/m2  SpO2 100% General:   Alert,  pleasant and cooperative in NAD Head:  Normocephalic and atraumatic. Neck:  Supple; no masses or thyromegaly. Lungs:  Clear throughout to auscultation.    Heart:  Regular rate  and rhythm. Abdomen:  Soft, nontender and nondistended. Normal bowel sounds, without guarding, and without rebound.   Neurologic:  Alert and  oriented x4;  grossly normal neurologically.  Impression/Plan: Morgan Herrera is here for an colonoscopy to be performed for screening. Risks, benefits, limitations, and alternatives regarding colonoscopy have been reviewed with the patient.  Questions have been answered.  All parties agreeable.   Morgan Herrera, Morgan Dawn, MD  05/19/2015, 1:13 PM

## 2015-05-19 NOTE — Anesthesia Preprocedure Evaluation (Signed)
Anesthesia Evaluation  Patient identified by MRN, date of birth, ID band Patient awake    Reviewed: Allergy & Precautions, H&P , NPO status , Patient's Chart, lab work & pertinent test results, reviewed documented beta blocker date and time   Airway Mallampati: II  TM Distance: >3 FB Neck ROM: full    Dental no notable dental hx.    Pulmonary neg pulmonary ROS, former smoker,  breath sounds clear to auscultation  Pulmonary exam normal       Cardiovascular Exercise Tolerance: Good negative cardio ROS  Rhythm:regular Rate:Normal     Neuro/Psych  Headaches, PSYCHIATRIC DISORDERS Hx of MS and polymyalgia rheumatica    GI/Hepatic negative GI ROS, Neg liver ROS,   Endo/Other  negative endocrine ROS  Renal/GU negative Renal ROS  negative genitourinary   Musculoskeletal   Abdominal   Peds  Hematology negative hematology ROS (+)   Anesthesia Other Findings   Reproductive/Obstetrics negative OB ROS                             Anesthesia Physical Anesthesia Plan  ASA: II  Anesthesia Plan: General   Post-op Pain Management:    Induction:   Airway Management Planned:   Additional Equipment:   Intra-op Plan:   Post-operative Plan:   Informed Consent: I have reviewed the patients History and Physical, chart, labs and discussed the procedure including the risks, benefits and alternatives for the proposed anesthesia with the patient or authorized representative who has indicated his/her understanding and acceptance.   Dental Advisory Given  Plan Discussed with: CRNA  Anesthesia Plan Comments:         Anesthesia Quick Evaluation

## 2015-05-19 NOTE — Op Note (Signed)
[facility] Gastroenterology Patient Name: Morgan Herrera Procedure Date: 05/19/2015 1:10 PM MRN: [id number] Account #: [id number] Date of Birth: 06-22-1951 Admit Type: Outpatient Age: 64 Room: [facility] ENDO ROOM 4 Gender: Female Note Status: Finalized Procedure:         Colonoscopy Indications:       Screening for colorectal malignant neoplasm Providers:         Lupita Dawn. Candace Cruise, MD Referring MD:      Pleas Koch (Referring MD) Medicines:         Monitored Anesthesia Care Complications:     No immediate complications. Procedure:         Pre-Anesthesia Assessment:                    - Prior to the procedure, a History and Physical was                     performed, and patient medications, allergies and                     sensitivities were reviewed. The patient's tolerance of                     previous anesthesia was reviewed.                    - The risks and benefits of the procedure and the sedation                     options and risks were discussed with the patient. All                     questions were answered and informed consent was obtained.                    - After reviewing the risks and benefits, the patient was                     deemed in satisfactory condition to undergo the procedure.                    After obtaining informed consent, the colonoscope was                     passed under direct vision. Throughout the procedure, the                     patient's blood pressure, pulse, and oxygen saturations                     were monitored continuously. The Colonoscope was                     introduced through the anus and advanced to the the cecum,                     identified by appendiceal orifice and ileocecal valve. The                     colonoscopy was performed with difficulty due to                     significant looping. Successful completion of the  procedure was aided by using manual  pressure. Findings:      The colon (entire examined portion) appeared normal. Impression:        - The entire examined colon is normal.                    - No specimens collected. Recommendation:    - Discharge patient to home.                    - Repeat colonoscopy in 10 years for surveillance.                    - The findings and recommendations were discussed with the                     patient. Procedure Code(s): --- Professional ---                    [id number], Colonoscopy, flexible; diagnostic, including                     collection of specimen(s) by brushing or washing, when                     performed (separate procedure) Diagnosis Code(s): --- Professional ---                    Z12.11, Encounter for screening for malignant neoplasm of                     colon CPT copyright 2014 American Medical Association. All rights reserved. The codes documented in this report are preliminary and upon coder review may  be revised to meet current compliance requirements. Hulen Luster, MD 05/19/2015 1:32:34 PM This report has been signed electronically. Number of Addenda: 0 Note Initiated On: 05/19/2015 1:10 PM Scope Withdrawal Time: 0 hours 4 minutes 15 seconds  Total Procedure Duration: 0 hours 12 minutes 45 seconds       [facility]

## 2015-05-19 NOTE — Transfer of Care (Signed)
Immediate Anesthesia Transfer of Care Note  Patient: Morgan Herrera  Procedure(s) Performed: Procedure(s): COLONOSCOPY WITH PROPOFOL (N/A)  Patient Location: PACU  Anesthesia Type:General  Level of Consciousness: awake, alert  and oriented  Airway & Oxygen Therapy: Patient Spontanous Breathing and Patient connected to nasal cannula oxygen  Post-op Assessment: Report given to RN and Post -op Vital signs reviewed and stable  Post vital signs: Reviewed and stable  Last Vitals:  Filed Vitals:   05/19/15 1334  BP: 91/79  Pulse: 52  Temp: 36.1 C  Resp: 16    Complications: No apparent anesthesia complications

## 2015-05-19 NOTE — Anesthesia Postprocedure Evaluation (Signed)
  Anesthesia Post-op Note  Patient: Morgan Herrera  Procedure(s) Performed: Procedure(s): COLONOSCOPY WITH PROPOFOL (N/A)  Anesthesia type:General  Patient location: PACU  Post pain: Pain level controlled  Post assessment: Post-op Vital signs reviewed, Patient's Cardiovascular Status Stable, Respiratory Function Stable, Patent Airway and No signs of Nausea or vomiting  Post vital signs: Reviewed and stable  Last Vitals:  Filed Vitals:   05/19/15 1350  BP: 103/56  Pulse: 80  Temp:   Resp: 14    Level of consciousness: awake, alert  and patient cooperative  Complications: No apparent anesthesia complications

## 2015-05-20 ENCOUNTER — Telehealth: Payer: Self-pay | Admitting: Primary Care

## 2015-05-20 ENCOUNTER — Encounter: Payer: Self-pay | Admitting: Gastroenterology

## 2015-05-20 NOTE — Telephone Encounter (Signed)
Called and notified patient of Kate's comments. Patient verbalized understanding.  Lab apt on 05/25/15. Nurse apt 05/26/15. 6 month follow up on 09/19/15.

## 2015-05-20 NOTE — Telephone Encounter (Signed)
I believe Morgan Herrera only needs a vitamin D lab test and a pneumovax injection. She doesn't really need to see me. Will you have her scheduled for lab and her pneumonia shot? If she'd like to see me that's fine, but I don't have anything in specific to talk about. She's on our schedule for next Friday. I would like to see her in December for a 6 month check up. Thanks.

## 2015-05-25 ENCOUNTER — Other Ambulatory Visit (INDEPENDENT_AMBULATORY_CARE_PROVIDER_SITE_OTHER): Payer: 59

## 2015-05-25 ENCOUNTER — Other Ambulatory Visit: Payer: 59

## 2015-05-25 DIAGNOSIS — E559 Vitamin D deficiency, unspecified: Secondary | ICD-10-CM

## 2015-05-25 LAB — VITAMIN D 25 HYDROXY (VIT D DEFICIENCY, FRACTURES): VITD: 49.27 ng/mL (ref 30.00–100.00)

## 2015-05-26 ENCOUNTER — Ambulatory Visit (INDEPENDENT_AMBULATORY_CARE_PROVIDER_SITE_OTHER): Payer: 59

## 2015-05-26 DIAGNOSIS — Z23 Encounter for immunization: Secondary | ICD-10-CM | POA: Diagnosis not present

## 2015-05-27 ENCOUNTER — Ambulatory Visit: Payer: 59 | Admitting: Primary Care

## 2015-08-11 ENCOUNTER — Other Ambulatory Visit: Payer: Self-pay | Admitting: Neurology

## 2015-08-11 DIAGNOSIS — G35 Multiple sclerosis: Secondary | ICD-10-CM

## 2015-08-30 ENCOUNTER — Ambulatory Visit
Admission: RE | Admit: 2015-08-30 | Discharge: 2015-08-30 | Disposition: A | Discharge status: Home / Self Care | Payer: 59 | Source: Ambulatory Visit | Attending: Neurology | Admitting: Neurology

## 2015-08-30 DIAGNOSIS — G35 Multiple sclerosis: Secondary | ICD-10-CM

## 2015-08-30 MED ORDER — GADOBENATE DIMEGLUMINE 529 MG/ML IV SOLN
10.0000 mL | Freq: Once | INTRAVENOUS | Status: AC | PRN
  Administered 2015-08-30: 10 mL via INTRAVENOUS

## 2015-09-19 ENCOUNTER — Encounter: Payer: Self-pay | Admitting: Primary Care

## 2015-09-19 ENCOUNTER — Ambulatory Visit (INDEPENDENT_AMBULATORY_CARE_PROVIDER_SITE_OTHER): Payer: 59 | Admitting: Primary Care

## 2015-09-19 VITALS — BP 102/76 | HR 97 | Temp 98.2°F | Ht 66.0 in | Wt 118.8 lb

## 2015-09-19 DIAGNOSIS — G35 Multiple sclerosis: Secondary | ICD-10-CM

## 2015-09-19 DIAGNOSIS — E559 Vitamin D deficiency, unspecified: Secondary | ICD-10-CM

## 2015-09-19 DIAGNOSIS — F32A Depression, unspecified: Secondary | ICD-10-CM

## 2015-09-19 DIAGNOSIS — E8770 Fluid overload, unspecified: Secondary | ICD-10-CM | POA: Diagnosis not present

## 2015-09-19 DIAGNOSIS — R609 Edema, unspecified: Secondary | ICD-10-CM

## 2015-09-19 DIAGNOSIS — F329 Major depressive disorder, single episode, unspecified: Secondary | ICD-10-CM

## 2015-09-19 DIAGNOSIS — R600 Localized edema: Secondary | ICD-10-CM

## 2015-09-19 DIAGNOSIS — G4700 Insomnia, unspecified: Secondary | ICD-10-CM

## 2015-09-19 MED ORDER — FUROSEMIDE 20 MG PO TABS: 20.0000 mg | ORAL_TABLET | Freq: Every day | ORAL | Status: DC

## 2015-09-19 MED ORDER — ZOLPIDEM TARTRATE 10 MG PO TABS: 5.0000 mg | ORAL_TABLET | Freq: Every evening | ORAL | Status: DC | PRN

## 2015-09-19 MED ORDER — ESCITALOPRAM OXALATE 5 MG PO TABS: 5.0000 mg | ORAL_TABLET | Freq: Every day | ORAL | Status: DC

## 2015-09-19 NOTE — Assessment & Plan Note (Addendum)
Feels well managed on lexapro. Was breaking the 10 mg tablets in half and has been doing so for years.  Will send in Lexapro 5 mg to her pharmacy. Denies SI/HI.

## 2015-09-19 NOTE — Progress Notes (Signed)
Pre visit review using our clinic review tool, if applicable. No additional management support is needed unless otherwise documented below in the visit note. 

## 2015-09-19 NOTE — Assessment & Plan Note (Signed)
Once following with Dr. Loanne Drilling in [place], now released and will only follow up PRN. Off of prednisone and prempro. Only managed on dimethyl fumarate 240 mg daily and lasix 20 mg as needed for lower extremity edema. No increased weakness or new symptoms. Will continue to monitor.

## 2015-09-19 NOTE — Patient Instructions (Signed)
Follow up in June for your annual physical.  Refills have been provided for your medications.  Start taking Claritin daily for nasal drainage.  Try applying Hydrocortisone cream to your rash twice daily as needed for itching.  It was a pleasure to see you today! Merry Christmas.

## 2015-09-19 NOTE — Progress Notes (Signed)
Subjective:    Patient ID: Morgan Herrera, female    DOB: 08/14/1951, 64 y.o.   MRN: [id number]  HPI  Morgan Herrera is a 64 year old female who presents today for follow up.  1) Depression: Currently managed on Lexapro 10 mg, but has been cutting them in half for years. She's feeling well managed at this dose. She would like to go to the 5mg  tablet so she doesn't have to continue breaking them in half. Denies SI/HI.  2) Multiple Sclerosis: Currently managed on Dimethyl fumarate 240 mg and last saw Morgan Herrera in [place] in September 2061. She will only follow with him as needed as she was released for follow up. She was weaned off of prednisone and prempro completely. She is also taking Lasix 20 mg as needed for lower extremity edema.   3) Vitamin D Deficiency: Once managed on vitamin D 50,000 unit capsules, vitamin D levels increased to 49. Currently on maintenance vitamin D 2000 units.   4) Insomnia: Long standing history of. Takes Ambien 10 mg, but will take 5 mg everynight at bedtime. Feels well managed at this dose, denies disruption of sleep patters, nightmares, sleep walking.  5) Rash: Present for the last several weeks and is located to her left flank area. She describes her rash as itchy. Denies pain and has not progressed/spread. She's not applied anything OTC for her symptoms.   Review of Systems  Respiratory: Negative for shortness of breath.   Cardiovascular: Negative for chest pain.  Skin: Positive for rash.  Neurological: Negative for weakness.  Psychiatric/Behavioral: Negative for suicidal ideas and sleep disturbance.       Past Medical History  Diagnosis Date  . Migraine   . PMR (polymyalgia rheumatica) (HCC)   . Multiple sclerosis ([place])   . Neuromuscular disorder [facility])     Social History   Social History  . Marital Status: Married    Spouse Name: N/A  . Number of Children: N/A  . Years of Education: N/A   Occupational History  . Not on file.    Social History Main Topics  . Smoking status: Former Research scientist (life sciences)  . Smokeless tobacco: Not on file  . Alcohol Use: 0.0 oz/week    0 Standard drinks or equivalent per week     Comment: 1 to 2 glass of wine at least 4 nights a week  . Drug Use: No  . Sexual Activity: Not on file   Other Topics Concern  . Not on file   Social History Narrative   Married.   Moved here from [place] after 35 years   Enjoys arts and crafts. Taking a painting class.   Walking with her spouse.    Reading, shopping.    Past Surgical History  Procedure Laterality Date  . Breast surgery  1990  . Eye surgery      1978 1st of 3 eye surgeries  . Tendon repair    . Colonoscopy with propofol N/A 05/19/2015    Procedure: COLONOSCOPY WITH PROPOFOL;  Surgeon: Hulen Luster, MD;  Location: [facility] ENDOSCOPY;  Service: Gastroenterology;  Laterality: N/A;    Family History  Problem Relation Age of Onset  . Heart disease Mother   . Hypertension Mother   . Cancer Maternal Grandmother     breast  . Parkinson's disease Mother   . Asthma Father     No Known Allergies  Current Outpatient Prescriptions on File Prior to Visit  Medication Sig Dispense Refill  .  aspirin 81 MG tablet Take 81 mg by mouth daily.    . Dimethyl Fumarate 240 MG CPDR Take 240 mg by mouth 2 (two) times daily.    . Vitamin D, Ergocalciferol, (DRISDOL) 50000 UNITS CAPS capsule Take 1 capsule by mouth once weekly for 12 weeks. 4 capsule 3   No current facility-administered medications on file prior to visit.    BP 102/76 mmHg  Pulse 97  Temp(Src) 98.2 F (36.8 C) (Oral)  Ht 5\' 6"  (1.676 m)  Wt 118 lb 12.8 oz (53.887 kg)  BMI 19.18 kg/m2  SpO2 97%    Objective:   Physical Exam  Constitutional: She appears well-nourished.  Cardiovascular: Normal rate and regular rhythm.   No lower extremity edema noted.  Pulmonary/Chest: Effort normal and breath sounds normal.  Musculoskeletal:  Good strength bilaterally  Skin: Skin is warm and dry.  Rash noted.  Small raised pruritic rash to left flank. Seems to follow dermatome, but doesn't represent shingles. Non tender.   Psychiatric: She has a normal mood and affect.          Assessment & Plan:

## 2015-09-19 NOTE — Assessment & Plan Note (Signed)
Longstanding history of. Currently managed on Ambien 10 mg, for which she'll take 1/2 tablet everynight at bedtime. Feels well on this medication at this dose. Refills provided.

## 2015-09-19 NOTE — Assessment & Plan Note (Signed)
Vitamin D of 49 in September, now maintained on 2000 units OTC. Will repeat at physical in June 2017.

## 2015-09-19 NOTE — Assessment & Plan Note (Addendum)
No increased swelling, continues to take lasix 20 mg which provides relief. Refills provided

## 2015-09-30 ENCOUNTER — Encounter: Payer: Self-pay | Admitting: Family Medicine

## 2015-09-30 ENCOUNTER — Ambulatory Visit (INDEPENDENT_AMBULATORY_CARE_PROVIDER_SITE_OTHER): Payer: 59 | Admitting: Family Medicine

## 2015-09-30 VITALS — BP 92/58 | HR 63 | Temp 97.8°F | Wt 118.2 lb

## 2015-09-30 DIAGNOSIS — R238 Other skin changes: Secondary | ICD-10-CM | POA: Diagnosis not present

## 2015-09-30 DIAGNOSIS — R233 Spontaneous ecchymoses: Secondary | ICD-10-CM | POA: Insufficient documentation

## 2015-09-30 LAB — CBC WITH DIFFERENTIAL/PLATELET
Basophils Absolute: 0.1 10*3/uL (ref 0.0–0.1)
Basophils Relative: 1.2 % (ref 0.0–3.0)
EOS ABS: 0.2 10*3/uL (ref 0.0–0.7)
EOS PCT: 3.3 % (ref 0.0–5.0)
HCT: 38.5 % (ref 36.0–46.0)
Hemoglobin: 12.5 g/dL (ref 12.0–15.0)
LYMPHS ABS: 0.7 10*3/uL (ref 0.7–4.0)
Lymphocytes Relative: 15.8 % (ref 12.0–46.0)
MCHC: 32.4 g/dL (ref 30.0–36.0)
MCV: 87.4 fl (ref 78.0–100.0)
MONO ABS: 0.6 10*3/uL (ref 0.1–1.0)
Monocytes Relative: 13.7 % — ABNORMAL HIGH (ref 3.0–12.0)
NEUTROS PCT: 66 % (ref 43.0–77.0)
Neutro Abs: 3 10*3/uL (ref 1.4–7.7)
Platelets: 248 10*3/uL (ref 150.0–400.0)
RBC: 4.41 Mil/uL (ref 3.87–5.11)
RDW: 13.9 % (ref 11.5–15.5)
WBC: 4.5 10*3/uL (ref 4.0–10.5)

## 2015-09-30 NOTE — Patient Instructions (Signed)
Go to the lab on the way out.  We'll contact you with your lab report. Take care.  Glad to see you.  I would expect the soreness to gradually resolve and the bruising to fade/spread downward.

## 2015-09-30 NOTE — Progress Notes (Signed)
Pre visit review using our clinic review tool, if applicable. No additional management support is needed unless otherwise documented below in the visit note.  H/o easy bruising at baseline.  Now with R medial ankle/lower skin bruising.  Smaller bruise on the L medial ankle posterior to the med mal.  Both present for about 2 weeks.  No clear trigger.  The bruises have enlarged, downward.  Areas are still tender.  No trauma.  No new shoes.  She has foot pain at baseline.  No other bleeding, from any other source (nose/bowel/urine/etc).  She feels well o/w, at baseline other than some episodic HA (not uncommon for patient).  No FCNAV. Taking 81mg  ASA daily.    Meds, vitals, and allergies reviewed.   ROS: See HPI.  Otherwise, noncontributory.  nad Feet and ankles with normal inspection except for R>L medial lower shin/ankle bruising.  Locally ttp but not ttp on bony prominences B Normal DP pulses B and no pain on ankle ROM.  Able to bear weight.

## 2015-09-30 NOTE — Assessment & Plan Note (Signed)
Likely incidental, with downward spread from gravity.  On ASA, wouldn't stop at this point.   No ominous sx o/w, but would check CBC re: PLT.  D/w pt.  F/u prn.  See AVS.  She agrees.

## 2015-11-21 ENCOUNTER — Telehealth: Payer: Self-pay | Admitting: Primary Care

## 2015-11-21 ENCOUNTER — Other Ambulatory Visit: Payer: Self-pay | Admitting: Primary Care

## 2015-11-21 DIAGNOSIS — G35 Multiple sclerosis: Secondary | ICD-10-CM

## 2015-11-21 NOTE — Telephone Encounter (Signed)
Pt needs a referral to neurologist. She currently sees one in [place] and now that she does not live there, she would like one locally  cb number is [phone] Thank you

## 2015-11-21 NOTE — Telephone Encounter (Signed)
Noted  Referral placed.

## 2015-12-13 ENCOUNTER — Ambulatory Visit (INDEPENDENT_AMBULATORY_CARE_PROVIDER_SITE_OTHER): Payer: 59 | Admitting: Neurology

## 2015-12-13 ENCOUNTER — Encounter: Payer: Self-pay | Admitting: Neurology

## 2015-12-13 VITALS — BP 116/60 | HR 63 | Ht 65.5 in | Wt 118.0 lb

## 2015-12-13 DIAGNOSIS — G35 Multiple sclerosis: Secondary | ICD-10-CM

## 2015-12-13 DIAGNOSIS — R4789 Other speech disturbances: Secondary | ICD-10-CM | POA: Diagnosis not present

## 2015-12-13 HISTORY — DX: Multiple sclerosis: G35

## 2015-12-13 NOTE — Patient Instructions (Signed)
1.  Continue Tecfidera and vitamin D3 2000 IU daily 2.  Recheck CBC with diff in 6 months. 3.  Refer for neuropsychological testing 4.  Follow up in 6 months

## 2015-12-13 NOTE — Progress Notes (Signed)
NEUROLOGY CONSULTATION NOTE  Morgan Herrera MRN: [id number] DOB: June 26, 1951  Referring provider: Sheral Flow, NP Primary care provider: Sheral Flow, NP  Reason for consult:  MS  HISTORY OF PRESENT ILLNESS: Morgan Herrera is a 65 year old right-handed female with multiple sclerosis, PMR and migraine who presents to establish care for multiple sclerosis and migraine.  History obtained by patient and neurologist's note.  Imaging of brain MRI from 2012 and 08/30/15 reviewed.  Labs reviewed.  She was diagnosed with MS at age 28, when she presented with bilateral optic neuritis, right worse than left.  She was treated with IV steroids at that time..  In hindsight, she recalls brief episodes from her past.  In her late-20s, she had a 3 week episode of dizziness and ataxia.  She had brief episodes of limb paresthesias lasting a couple of days.  Work up included MRIs.  MRIs of the brain have shown lesions suspicious for MS.  Prior cervical MRI reportedly normal.  She never had a relapse and therefore never required further IV steroids.  However, she was diagnosed with polymyalgia rheumatica 5 years ago, after experiencing severe joint pain and stiffness.  She had been on prednisone for five years and was tapered off a few months ago.  She occasionally has fatigue once in a while, sometimes lasting for 2 weeks.  She denies problems with gait or balance.  She does not exhibit Lhermitte's sign. She works part-time as a Radiation protection practitioner.  For the past couple of years, she reports word-finding difficulty.  Sometimes it is difficult to concentrate.  It has almost affected her work at times.  She has a BA in [place].  She is currently treated with Tecfidera 240mg  twice daily.  She has been on Tecfidera for 2 years.  She takes ASA 81mg  daily to treat the associated hot flashes.  Prior disease modifying drugs include Avonex.  She stopped because she was tired of injections.  Most  recent MRI of brain with and without contrast from 09/06/15 showed chronic non-enhancing T2/FLAIR hyperintensities in the periventricular white matter, some demonstrating Dawson's fingers, with mild corpus callosum involvement, which appears stable compared to 2012 and similar to imaging from 2005.  Labs from 09/30/15 include CBC with WBC 4.5 with 15.8% and ALC 500.  Vitamin D level was 49.27.  She takes D3 2000 IU daily Labs from 06/16/14 include Sed Rate 9 and CRP less than 0.5.  Previously followed by Dr. Corrinne Eagle in [place].  She moved to [place] from [place] a year ago.  PAST MEDICAL HISTORY: Past Medical History  Diagnosis Date  . Migraine   . PMR (polymyalgia rheumatica) (HCC)   . Multiple sclerosis ([place])   . Neuromuscular disorder ([place])     PAST SURGICAL HISTORY: Past Surgical History  Procedure Laterality Date  . Breast surgery  1990  . Eye surgery      1978 1st of 3 eye surgeries  . Tendon repair    . Colonoscopy with propofol N/A 05/19/2015    Procedure: COLONOSCOPY WITH PROPOFOL;  Surgeon: Hulen Luster, MD;  Location: [facility] ENDOSCOPY;  Service: Gastroenterology;  Laterality: N/A;    MEDICATIONS: Current Outpatient Prescriptions on File Prior to Visit  Medication Sig Dispense Refill  . aspirin 81 MG tablet Take 81 mg by mouth daily.    . cholecalciferol (VITAMIN D) 1000 units tablet Take 2,000 Units by mouth daily.    . Dimethyl Fumarate 240 MG CPDR Take 240 mg by  mouth 2 (two) times daily.    Marland Kitchen escitalopram (LEXAPRO) 5 MG tablet Take 1 tablet (5 mg total) by mouth daily. 90 tablet 2  . furosemide (LASIX) 20 MG tablet Take 1 tablet (20 mg total) by mouth daily. 90 tablet 2  . zolpidem (AMBIEN) 10 MG tablet Take 0.5-1 tablets (5-10 mg total) by mouth at bedtime as needed for sleep. 90 tablet 2   No current facility-administered medications on file prior to visit.    ALLERGIES: No Known Allergies  FAMILY HISTORY: Family History  Problem Relation Age of  Onset  . Heart disease Mother   . Hypertension Mother   . Parkinson's disease Mother   . Parkinsonism Mother   . Cancer Maternal Grandmother     breast  . Asthma Father     SOCIAL HISTORY: Social History   Social History  . Marital Status: Married    Spouse Name: N/A  . Number of Children: N/A  . Years of Education: N/A   Occupational History  . Not on file.   Social History Main Topics  . Smoking status: Former Research scientist (life sciences)  . Smokeless tobacco: Not on file  . Alcohol Use: 0.0 oz/week    0 Standard drinks or equivalent per week     Comment: 1 to 2 glass of wine at least 4 nights a week  . Drug Use: No  . Sexual Activity: Not on file   Other Topics Concern  . Not on file   Social History Narrative   Married.   Moved here from [place] after 35 years   Enjoys arts and crafts. Taking a painting class.   Walking with her spouse.    Reading, shopping.    REVIEW OF SYSTEMS: Constitutional: No fevers, chills, or sweats, no generalized fatigue, change in appetite Eyes: No visual changes, double vision, eye pain Ear, nose and throat: No hearing loss, ear pain, nasal congestion, sore throat Cardiovascular: No chest pain, palpitations Respiratory:  No shortness of breath at rest or with exertion, wheezes GastrointestinaI: No nausea, vomiting, diarrhea, abdominal pain, fecal incontinence Genitourinary:  No dysuria, urinary retention or frequency Musculoskeletal:  No neck pain, back pain Integumentary: No rash, pruritus, skin lesions Neurological: as above Psychiatric: No depression, insomnia, anxiety Endocrine: No palpitations, fatigue, diaphoresis, mood swings, change in appetite, change in weight, increased thirst Hematologic/Lymphatic:  No anemia, purpura, petechiae. Allergic/Immunologic: no itchy/runny eyes, nasal congestion, recent allergic reactions, rashes  PHYSICAL EXAM: Filed Vitals:   12/13/15 0836  BP: 116/60  Pulse: 63   General: No acute distress.  Patient  appears well-groomed.   Head:  Normocephalic/atraumatic Eyes:  fundi examined but not visualized. Neck: supple, no paraspinal tenderness, full range of motion Back: No paraspinal tenderness Heart: regular rate and rhythm Lungs: Clear to auscultation bilaterally. Vascular: No carotid bruits. Neurological Exam: Mental status: alert and oriented to person, place (except she couldn't remember the name of county), and time, recent and remote memory intact, fund of knowledge intact, attention and concentration intact, speech fluent and not dysarthric, language intact. MMSE - Mini Mental State Exam 12/13/2015  Orientation to time 5  Orientation to Place 4  Registration 3  Attention/ Calculation 5  Recall 3  Language- name 2 objects 2  Language- repeat 1  Language- follow 3 step command 3  Language- read & follow direction 1  Write a sentence 1  Copy design 1  Total score 29   Cranial nerves: CN I: not tested CN II: pupils equal, round and  reactive to light, significant peripheral vision loss in right eye. CN III, IV, VI:  full range of motion, no nystagmus, no ptosis CN V: facial sensation intact CN VII: upper and lower face symmetric CN VIII: hearing intact CN IX, X: gag intact, uvula midline CN XI: sternocleidomastoid and trapezius muscles intact CN XII: tongue midline Bulk & Tone: normal, no fasciculations. Motor:  4+/5 right deltoid (limited due to pain).  Otherwise 5/5 throughout  Sensation:  Mild decreased pinprick sensation in left hand, vibration sensation intact. Deep Tendon Reflexes:  2+ throughout, except 3+ in patellars, toes downgoing.  Finger to nose testing:  Without dysmetria.  Heel to shin:  Without dysmetria.   Gait:  Normal station and stride.  Able to turn and tandem walk. Romberg negative.  Timed 25 foot walking test 4.78 seconds.  IMPRESSION: Relapsing-remitting MS.  Word-finding difficulties and problems with attention and concentration.   She asked me  about discontinuing Tecfidera since she has done so well over the years.  I advised to continue it, since she is doing well and she is tolerating it.  Also, she has been on prednisone for 5 years, which may have contributed to controlling the MS as well.  PLAN: 1.  Continue Tecfidera.  Recheck CBC with diff in 6 months 2.  Continue D3 2000 IU daily 3.  Refer for neuropsychological testing to establish baseline and evaluate for any cognitive impairment which may be due to MS. 4.  Follow up in 6 months.  Thank you for allowing me to take part in the care of this patient.  Metta Clines, DO  CC:  Sheral Flow, NP

## 2016-03-06 ENCOUNTER — Other Ambulatory Visit: Payer: Self-pay | Admitting: Primary Care

## 2016-03-06 DIAGNOSIS — Z1159 Encounter for screening for other viral diseases: Secondary | ICD-10-CM

## 2016-03-06 DIAGNOSIS — E559 Vitamin D deficiency, unspecified: Secondary | ICD-10-CM

## 2016-03-06 DIAGNOSIS — E785 Hyperlipidemia, unspecified: Secondary | ICD-10-CM

## 2016-03-12 ENCOUNTER — Other Ambulatory Visit (INDEPENDENT_AMBULATORY_CARE_PROVIDER_SITE_OTHER): Payer: 59

## 2016-03-12 DIAGNOSIS — E559 Vitamin D deficiency, unspecified: Secondary | ICD-10-CM | POA: Diagnosis not present

## 2016-03-12 DIAGNOSIS — E785 Hyperlipidemia, unspecified: Secondary | ICD-10-CM

## 2016-03-12 DIAGNOSIS — Z1159 Encounter for screening for other viral diseases: Secondary | ICD-10-CM

## 2016-03-12 LAB — COMPREHENSIVE METABOLIC PANEL
ALBUMIN: 4.6 g/dL (ref 3.5–5.2)
ALK PHOS: 49 U/L (ref 39–117)
ALT: 20 U/L (ref 0–35)
AST: 22 U/L (ref 0–37)
BILIRUBIN TOTAL: 0.5 mg/dL (ref 0.2–1.2)
BUN: 27 mg/dL — ABNORMAL HIGH (ref 6–23)
CALCIUM: 9.9 mg/dL (ref 8.4–10.5)
CO2: 29 mEq/L (ref 19–32)
Chloride: 105 mEq/L (ref 96–112)
Creatinine, Ser: 0.67 mg/dL (ref 0.40–1.20)
GFR: 94.04 mL/min (ref >60.00)
Glucose, Bld: 95 mg/dL (ref 70–99)
Potassium: 4.4 mEq/L (ref 3.5–5.1)
Sodium: 143 mEq/L (ref 135–145)
TOTAL PROTEIN: 7 g/dL (ref 6.0–8.3)

## 2016-03-12 LAB — LIPID PANEL
Cholesterol: 214 mg/dL — ABNORMAL HIGH (ref 0–200)
HDL: 92.2 mg/dL (ref >39.00)
LDL Cholesterol: 102 mg/dL — ABNORMAL HIGH (ref 0–99)
NONHDL: 121.38
Total CHOL/HDL Ratio: 2
Triglycerides: 98 mg/dL (ref 0.0–149.0)
VLDL: 19.6 mg/dL (ref 0.0–40.0)

## 2016-03-12 LAB — VITAMIN D 25 HYDROXY (VIT D DEFICIENCY, FRACTURES): VITD: 45.35 ng/mL (ref 30.00–100.00)

## 2016-03-13 LAB — HEPATITIS C ANTIBODY: HCV AB: NEGATIVE

## 2016-03-19 ENCOUNTER — Encounter: Payer: Self-pay | Admitting: Primary Care

## 2016-03-19 ENCOUNTER — Ambulatory Visit (INDEPENDENT_AMBULATORY_CARE_PROVIDER_SITE_OTHER): Payer: 59 | Admitting: Primary Care

## 2016-03-19 VITALS — BP 114/70 | HR 64 | Temp 98.0°F | Ht 66.0 in | Wt 122.8 lb

## 2016-03-19 DIAGNOSIS — Z1239 Encounter for other screening for malignant neoplasm of breast: Secondary | ICD-10-CM

## 2016-03-19 DIAGNOSIS — G35 Multiple sclerosis: Secondary | ICD-10-CM

## 2016-03-19 DIAGNOSIS — E559 Vitamin D deficiency, unspecified: Secondary | ICD-10-CM

## 2016-03-19 DIAGNOSIS — F329 Major depressive disorder, single episode, unspecified: Secondary | ICD-10-CM

## 2016-03-19 DIAGNOSIS — K5900 Constipation, unspecified: Secondary | ICD-10-CM | POA: Insufficient documentation

## 2016-03-19 DIAGNOSIS — Z0000 Encounter for general adult medical examination without abnormal findings: Secondary | ICD-10-CM | POA: Diagnosis not present

## 2016-03-19 DIAGNOSIS — G4700 Insomnia, unspecified: Secondary | ICD-10-CM

## 2016-03-19 DIAGNOSIS — F32A Depression, unspecified: Secondary | ICD-10-CM

## 2016-03-19 NOTE — Patient Instructions (Addendum)
Your labs overall look great!  Continue exercising. You should be getting 1 hour of moderate intensity exercise 5 days weekly.  Ensure you are consuming 64 ounces of water daily.  You will be contacted regarding your mammogram.  Please let us know if you have not heard back within one week.   Continue taking Vitamin D capsules.   Constipation: Try Miralax. Mix in 1 capful into 8 ounces of liquid 2-3 times weekly.   Allergies: Start an antihistamine such as Claritin, Zyrtec, or Allegra daily for the next 4 weeks.   Nasal Congestion: Try using Flonase (fluticasone) nasal spray. Instill 2 sprays in each nostril once daily.   Please notify me no improvement in constipation and allergy symptoms.  Follow up in 1 year for repeat physical or sooner if needed.  It was a pleasure to see you today!

## 2016-03-19 NOTE — Progress Notes (Signed)
Pre visit review using our clinic review tool, if applicable. No additional management support is needed unless otherwise documented below in the visit note. 

## 2016-03-19 NOTE — Assessment & Plan Note (Signed)
Overall stable, does have some concerns about 4 pound weight gain since last visit. Discussed healthy lifestyle changes and will continue to monitor.

## 2016-03-19 NOTE — Assessment & Plan Note (Signed)
More recent of the last several months. Bowel movements twice weekly on average and are firm. No improvement with probiotics and over-the-counter ali. Will have her try MiraLAX twice weekly with a full glass of water and will have patient update if no improvement.

## 2016-03-19 NOTE — Assessment & Plan Note (Signed)
Stable with over-the-counter vitamin D 1000 units. Continue same.

## 2016-03-19 NOTE — Assessment & Plan Note (Addendum)
Immunizations up-to-date. Pap, bone density, colonoscopy up-to-date, mammogram ordered. Overall healthy diet, does drink wine every night. Very minor weight gain since last visit overall stable. Exam unremarkable, labs stable. Encouraged continue consumption of daily vitamin D, continuation of diet, increase in exercise. Follow-up in one year for repeat physical.

## 2016-03-19 NOTE — Assessment & Plan Note (Signed)
Currently following with neurology in [place]. She is no longer taking prednisone. Has noticed fatigue for the last several months.

## 2016-03-19 NOTE — Progress Notes (Signed)
Subjective:    Patient ID: Morgan Herrera, female    DOB: [birth year], 65 y.o.   MRN: [id number]  HPI  Morgan Herrera is a 65 year old female who presents today for complete physical.  Immunizations: -Tetanus: Completed in March 2016 -Influenza: Did not complete last season -Pneumonia: Completed in August 2016 -Shingles: Completed 2012  Diet: She endorses a healthy diet Breakfast: Snacks Lunch: Chicken salad, egg salad, vegetales Dinner: Meat, vegetable, pasta,  Snacks: Fruit Desserts: Chocolate, cookie Beverages: Wine everynight, water, unsweet tea  Exercise: Walking 3 days weekly Eye exam: Completed 1 year ago.  Dental exam: Completed in 2017 Colonoscopy: Completed in August 2016 Dexa: Completed in January 2016 Pap Smear: Completed in 2015 Mammogram: Completed in January 2016  Wt Readings from Last 3 Encounters:  03/19/16 122 lb 12.8 oz (55.702 kg)  12/13/15 118 lb (53.524 kg)  09/30/15 118 lb 4 oz (53.638 kg)      Review of Systems  Constitutional: Positive for fatigue. Negative for fever.  HENT: Positive for congestion, rhinorrhea and sinus pressure. Negative for sore throat.   Respiratory: Positive for cough. Negative for shortness of breath.   Cardiovascular: Negative for chest pain.  Gastrointestinal: Negative for diarrhea.       Bowel movements twice weekly. No improvement with probiotics, Alli.   Genitourinary: Negative for difficulty urinating.  Musculoskeletal: Negative for myalgias and arthralgias.  Allergic/Immunologic: Positive for environmental allergies.  Neurological: Positive for headaches. Negative for numbness.       Occasional dizziness with position changes.  Psychiatric/Behavioral:       Denies concerns for anxiety or depression       Past Medical History  Diagnosis Date  . Migraine   . PMR (polymyalgia rheumatica) (HCC)   . Multiple sclerosis ([place])   . Neuromuscular disorder [facility])      Social History   Social History  .  Marital Status: Married    Spouse Name: N/A  . Number of Children: N/A  . Years of Education: N/A   Occupational History  . Not on file.   Social History Main Topics  . Smoking status: Former Research scientist (life sciences)  . Smokeless tobacco: Not on file  . Alcohol Use: 0.0 oz/week    0 Standard drinks or equivalent per week     Comment: 1 to 2 glass of wine at least 4 nights a week  . Drug Use: No  . Sexual Activity: Not on file   Other Topics Concern  . Not on file   Social History Narrative   Married.   Moved here from [place] after 35 years   Enjoys arts and crafts. Taking a painting class.   Walking with her spouse.    Reading, shopping.    Past Surgical History  Procedure Laterality Date  . Breast surgery  1990  . Eye surgery      1978 1st of 3 eye surgeries  . Tendon repair    . Colonoscopy with propofol N/A 05/19/2015    Procedure: COLONOSCOPY WITH PROPOFOL;  Surgeon: Hulen Luster, MD;  Location: [facility] ENDOSCOPY;  Service: Gastroenterology;  Laterality: N/A;    Family History  Problem Relation Age of Onset  . Heart disease Mother   . Hypertension Mother   . Parkinson's disease Mother   . Parkinsonism Mother   . Cancer Maternal Grandmother     breast  . Asthma Father     No Known Allergies  Current Outpatient Prescriptions on File Prior to Visit  Medication Sig Dispense Refill  . aspirin 81 MG tablet Take 81 mg by mouth daily.    . cholecalciferol (VITAMIN D) 1000 units tablet Take 2,000 Units by mouth daily.    . Dimethyl Fumarate 240 MG CPDR Take 240 mg by mouth 2 (two) times daily.    Marland Kitchen escitalopram (LEXAPRO) 5 MG tablet Take 1 tablet (5 mg total) by mouth daily. 90 tablet 2  . furosemide (LASIX) 20 MG tablet Take 1 tablet (20 mg total) by mouth daily. 90 tablet 2  . zolpidem (AMBIEN) 10 MG tablet Take 0.5-1 tablets (5-10 mg total) by mouth at bedtime as needed for sleep. 90 tablet 2   No current facility-administered medications on file prior to visit.    BP 114/70  mmHg  Pulse 64  Temp(Src) 98 F (36.7 C) (Oral)  Ht 5\' 6"  (1.676 m)  Wt 122 lb 12.8 oz (55.702 kg)  BMI 19.83 kg/m2  SpO2 97%    Objective:   Physical Exam  Constitutional: She is oriented to person, place, and time. She appears well-nourished.  HENT:  Right Ear: Tympanic membrane and ear canal normal.  Left Ear: Tympanic membrane and ear canal normal.  Nose: Nose normal.  Mouth/Throat: Oropharynx is clear and moist.  Eyes: Conjunctivae and EOM are normal. Pupils are equal, round, and reactive to light.  Neck: Neck supple. No thyromegaly present.  Cardiovascular: Normal rate and regular rhythm.   No murmur heard. Pulmonary/Chest: Effort normal and breath sounds normal. She has no rales.  Abdominal: Soft. Bowel sounds are normal. There is no tenderness.  Musculoskeletal: Normal range of motion.  Lymphadenopathy:    She has no cervical adenopathy.  Neurological: She is alert and oriented to person, place, and time. She has normal reflexes. No cranial nerve deficit.  Skin: Skin is warm and dry. No rash noted.  Psychiatric: She has a normal mood and affect.          Assessment & Plan:

## 2016-03-19 NOTE — Assessment & Plan Note (Signed)
Stable on Ambien

## 2016-04-23 ENCOUNTER — Encounter: Payer: Self-pay | Admitting: Internal Medicine

## 2016-04-23 ENCOUNTER — Other Ambulatory Visit: Payer: Self-pay | Admitting: Primary Care

## 2016-04-23 ENCOUNTER — Telehealth: Payer: Self-pay

## 2016-04-23 ENCOUNTER — Ambulatory Visit (INDEPENDENT_AMBULATORY_CARE_PROVIDER_SITE_OTHER): Payer: 59 | Admitting: Internal Medicine

## 2016-04-23 VITALS — BP 116/74 | HR 76 | Temp 98.1°F | Wt 123.8 lb

## 2016-04-23 DIAGNOSIS — Z1239 Encounter for other screening for malignant neoplasm of breast: Secondary | ICD-10-CM

## 2016-04-23 DIAGNOSIS — H1132 Conjunctival hemorrhage, left eye: Secondary | ICD-10-CM

## 2016-04-23 NOTE — Telephone Encounter (Signed)
Pt was seen today and stated she was supposed to get referral for mammogram. I did not see it ordered in chart--please advise

## 2016-04-23 NOTE — Patient Instructions (Signed)
Subconjunctival Hemorrhage °Subconjunctival hemorrhage is bleeding that happens between the white part of your eye (sclera) and the clear membrane that covers the outside of your eye (conjunctiva). There are many tiny blood vessels near the surface of your eye. A subconjunctival hemorrhage happens when one or more of these vessels breaks and bleeds, causing a red patch to appear on your eye. This is similar to a bruise. °Depending on the amount of bleeding, the red patch may only cover a small area of your eye or it may cover the entire visible part of the sclera. If a lot of blood collects under the conjunctiva, there may also be swelling. Subconjunctival hemorrhages do not affect your vision or cause pain, but your eye may feel irritated if there is swelling. Subconjunctival hemorrhages usually do not require treatment, and they disappear on their own within two weeks. °CAUSES °This condition may be caused by: °· Mild trauma, such as rubbing your eye too hard. °· Severe trauma or blunt injuries. °· Coughing, sneezing, or vomiting. °· Straining, such as when lifting a heavy object. °· High blood pressure. °· Recent eye surgery. °· A history of diabetes. °· Certain medicines, especially blood thinners (anticoagulants). °· Other conditions, such as eye tumors, bleeding disorders, or blood vessel abnormalities. °Subconjunctival hemorrhages can happen without an obvious cause.  °SYMPTOMS  °Symptoms of this condition include: °· A bright red or dark red patch on the white part of the eye. °¨ The red area may spread out to cover a larger area of the eye before it goes away. °¨ The red area may turn brownish-yellow before it goes away. °· Swelling. °· Mild eye irritation. °DIAGNOSIS °This condition is diagnosed with a physical exam. If your subconjunctival hemorrhage was caused by trauma, your health care provider may refer you to an eye specialist (ophthalmologist) or another specialist to check for other injuries. You  may have other tests, including: °· An eye exam. °· A blood pressure check. °· Blood tests to check for bleeding disorders. °If your subconjunctival hemorrhage was caused by trauma, X-rays or a CT scan may be done to check for other injuries. °TREATMENT °Usually, no treatment is needed. Your health care provider may recommend eye drops or cold compresses to help with discomfort. °HOME CARE INSTRUCTIONS °· Take over-the-counter and prescription medicines only as directed by your health care provider. °· Use eye drops or cold compresses to help with discomfort as directed by your health care provider. °· Avoid activities, things, and environments that may irritate or injure your eye. °· Keep all follow-up visits as told by your health care provider. This is important. °SEEK MEDICAL CARE IF: °· You have pain in your eye. °· The bleeding does not go away within 3 weeks. °· You keep getting new subconjunctival hemorrhages. °SEEK IMMEDIATE MEDICAL CARE IF: °· Your vision changes or you have difficulty seeing. °· You suddenly develop severe sensitivity to light. °· You develop a severe headache, persistent vomiting, confusion, or abnormal tiredness (lethargy). °· Your eye seems to bulge or protrude from your eye socket. °· You develop unexplained bruises on your body. °· You have unexplained bleeding in another area of your body. °  °This information is not intended to replace advice given to you by your health care provider. Make sure you discuss any questions you have with your health care provider. °  °Document Released: 09/17/2005 Document Revised: 06/08/2015 Document Reviewed: 11/24/2014 °Elsevier Interactive Patient Education ©2016 Elsevier Inc. ° °

## 2016-04-23 NOTE — Progress Notes (Signed)
Pre visit review using our clinic review tool, if applicable. No additional management support is needed unless otherwise documented below in the visit note. 

## 2016-04-23 NOTE — Telephone Encounter (Signed)
Mammogram is already ordered and visible in patient's chart. She will need to call and schedule with [facility].

## 2016-04-23 NOTE — Progress Notes (Signed)
Subjective:    Patient ID: Morgan Herrera, female    DOB: [birth year], 65 y.o.   MRN: [id number]  HPI  Pt presents to the clinic today with a complaint of left eye redness that began yesterday.  She reports she had similar symptoms 2 months ago when she had a busted blood vessel in the same eye.  She admits to some itchiness and slight blurriness in the left eye and a mild "pulling" sensation when moving the eye, but denies eye pain, discharge, or photophobia.  She denies fever, sore throat, runny nose, stuffy nose, ear discharge, hearing changes, or cough.  She reports no recent sick contacts or trauma to the eye.  Review of Systems   Past Medical History:  Diagnosis Date  . Migraine   . Multiple sclerosis ([place])   . Neuromuscular disorder ([place])   . PMR (polymyalgia rheumatica) (HCC)     Current Outpatient Prescriptions  Medication Sig Dispense Refill  . aspirin 81 MG tablet Take 81 mg by mouth daily.    . cholecalciferol (VITAMIN D) 1000 units tablet Take 2,000 Units by mouth daily.    . Dimethyl Fumarate 240 MG CPDR Take 240 mg by mouth 2 (two) times daily.    Marland Kitchen escitalopram (LEXAPRO) 5 MG tablet Take 1 tablet (5 mg total) by mouth daily. 90 tablet 2  . furosemide (LASIX) 20 MG tablet Take 1 tablet (20 mg total) by mouth daily. 90 tablet 2  . zolpidem (AMBIEN) 10 MG tablet Take 0.5-1 tablets (5-10 mg total) by mouth at bedtime as needed for sleep. 90 tablet 2   No current facility-administered medications for this visit.     No Known Allergies  Family History  Problem Relation Age of Onset  . Heart disease Mother   . Hypertension Mother   . Parkinson's disease Mother   . Parkinsonism Mother   . Cancer Maternal Grandmother     breast  . Asthma Father     Social History   Social History  . Marital status: Married    Spouse name: N/A  . Number of children: N/A  . Years of education: N/A   Occupational History  . Not on file.   Social History Main Topics  .  Smoking status: Former Research scientist (life sciences)  . Smokeless tobacco: Not on file  . Alcohol use 0.0 oz/week     Comment: 1 to 2 glass of wine at least 4 nights a week  . Drug use: No  . Sexual activity: Not on file   Other Topics Concern  . Not on file   Social History Narrative   Married.   Moved here from [place] after 35 years   Enjoys arts and crafts. Taking a painting class.   Walking with her spouse.    Reading, shopping.     Const: Denies fever. HEENT: Denies eye discharge, nasal discharge, nasal congestion, sore throat, hearing changes, or ear discharge. Pulm: Denies cough..  No other specific complaints in a complete review of systems (except as listed in HPI above).     Objective:   Physical Exam Blood pressure 116/74, pulse 76, temperature 98.1 F (36.7 C), temperature source Oral, weight 123 lb 12 oz (56.1 kg), SpO2 98 %.   General:  Well-appearing, in no acute distress. HEENT:  Left eye bright red medially.  Right eye no scleral icterus or conjunctival injection.  EOMs intact.  No erythema or discharge of nose.  TMs pearly grey, some cerumen present left ear.  No erythema or exudates of pharynx.   Neck: No lymphadenopathy present. Lungs: Clear to auscultation bilaterally.  No wheezes, rales, or rhonchi. CV:  Regular rate and rhythm.  No murmurs, rubs, or gallops.      Assessment & Plan:   Subconjunctival hemorrhage of left eye:  Reviewed that condition is self-limiting Discontinue Aspirin daily to see if it helps prevent recurrence Call if symptoms worsen or do not resolve  Rasheen Schewe, NP

## 2016-05-29 ENCOUNTER — Telehealth: Payer: Self-pay | Admitting: Neurology

## 2016-05-29 NOTE — Telephone Encounter (Signed)
Patient needs a refill on her tecfidera  Patient phone number is [phone] she uses [facility]

## 2016-06-01 NOTE — Telephone Encounter (Signed)
Please address

## 2016-06-05 ENCOUNTER — Other Ambulatory Visit: Payer: Self-pay | Admitting: Primary Care

## 2016-06-05 DIAGNOSIS — F329 Major depressive disorder, single episode, unspecified: Secondary | ICD-10-CM

## 2016-06-05 DIAGNOSIS — F32A Depression, unspecified: Secondary | ICD-10-CM

## 2016-06-05 MED ORDER — DIMETHYL FUMARATE 240 MG PO CPDR: 240.0000 mg | DELAYED_RELEASE_CAPSULE | Freq: Two times a day (BID) | ORAL | 2 refills | Status: DC

## 2016-06-05 NOTE — Telephone Encounter (Signed)
-----   Message from Amada Kingfisher, [place] sent at 12/13/2015  9:22 AM EDT ----- CBC w/ Diff

## 2016-06-05 NOTE — Telephone Encounter (Signed)
RX sent in. Pt aware. WIll come have CBC done before upcoming appointment.

## 2016-06-08 ENCOUNTER — Telehealth: Payer: Self-pay | Admitting: Neurology

## 2016-06-08 DIAGNOSIS — G35 Multiple sclerosis: Secondary | ICD-10-CM

## 2016-06-08 NOTE — Telephone Encounter (Signed)
Order placed. Left message to make pt aware.

## 2016-06-08 NOTE — Telephone Encounter (Signed)
Morgan Herrera 2051-08-27. She has an appointment with Dr. Tomi Likens on 9/15 and she needs to have blood work taken before then. She would like to go to her PCP [place] office to have that done. She lives across the street. They said they could do it, but would need the order put in Epic. Thank you

## 2016-06-11 ENCOUNTER — Other Ambulatory Visit (INDEPENDENT_AMBULATORY_CARE_PROVIDER_SITE_OTHER): Payer: 59

## 2016-06-11 DIAGNOSIS — G35 Multiple sclerosis: Secondary | ICD-10-CM

## 2016-06-11 LAB — CBC WITH DIFFERENTIAL/PLATELET
BASOS ABS: 0.1 10*3/uL (ref 0.0–0.1)
Basophils Relative: 1.7 % (ref 0.0–3.0)
EOS PCT: 2.4 % (ref 0.0–5.0)
Eosinophils Absolute: 0.1 10*3/uL (ref 0.0–0.7)
HEMATOCRIT: 38.9 % (ref 36.0–46.0)
Hemoglobin: 13 g/dL (ref 12.0–15.0)
LYMPHS ABS: 0.8 10*3/uL (ref 0.7–4.0)
LYMPHS PCT: 17.7 % (ref 12.0–46.0)
MCHC: 33.4 g/dL (ref 30.0–36.0)
MCV: 86.4 fl (ref 78.0–100.0)
MONOS PCT: 10.8 % (ref 3.0–12.0)
Monocytes Absolute: 0.5 10*3/uL (ref 0.1–1.0)
NEUTROS ABS: 2.9 10*3/uL (ref 1.4–7.7)
Neutrophils Relative %: 67.4 % (ref 43.0–77.0)
PLATELETS: 231 10*3/uL (ref 150.0–400.0)
RBC: 4.51 Mil/uL (ref 3.87–5.11)
RDW: 14.3 % (ref 11.5–15.5)
WBC: 4.3 10*3/uL (ref 4.0–10.5)

## 2016-06-11 NOTE — Addendum Note (Signed)
Addended by: Gerda Diss A on: 06/11/2016 11:48 AM   Modules accepted: Orders

## 2016-06-15 ENCOUNTER — Other Ambulatory Visit (INDEPENDENT_AMBULATORY_CARE_PROVIDER_SITE_OTHER): Payer: 59

## 2016-06-15 ENCOUNTER — Encounter: Payer: Self-pay | Admitting: Neurology

## 2016-06-15 ENCOUNTER — Ambulatory Visit (INDEPENDENT_AMBULATORY_CARE_PROVIDER_SITE_OTHER): Payer: 59 | Admitting: Neurology

## 2016-06-15 VITALS — BP 112/66 | HR 59 | Ht 66.0 in | Wt 124.0 lb

## 2016-06-15 DIAGNOSIS — G35 Multiple sclerosis: Secondary | ICD-10-CM

## 2016-06-15 DIAGNOSIS — M79672 Pain in left foot: Secondary | ICD-10-CM

## 2016-06-15 DIAGNOSIS — M79671 Pain in right foot: Secondary | ICD-10-CM

## 2016-06-15 LAB — HEPATIC FUNCTION PANEL
ALBUMIN: 4.5 g/dL (ref 3.5–5.2)
ALT: 20 U/L (ref 0–35)
AST: 22 U/L (ref 0–37)
Alkaline Phosphatase: 57 U/L (ref 39–117)
Bilirubin, Direct: 0.1 mg/dL (ref 0.0–0.3)
Total Bilirubin: 0.5 mg/dL (ref 0.2–1.2)
Total Protein: 7 g/dL (ref 6.0–8.3)

## 2016-06-15 LAB — CBC
HCT: 37.4 % (ref 35.0–45.0)
HEMOGLOBIN: 12.3 g/dL (ref 11.7–15.5)
MCH: 28.3 pg (ref 27.0–33.0)
MCHC: 32.9 g/dL (ref 32.0–36.0)
MCV: 86.2 fL (ref 80.0–100.0)
MPV: 11.1 fL (ref 7.5–12.5)
PLATELETS: 226 10*3/uL (ref 140–400)
RBC: 4.34 MIL/uL (ref 3.80–5.10)
RDW: 14.3 % (ref 11.0–15.0)
WBC: 4.5 10*3/uL (ref 3.8–10.8)

## 2016-06-15 LAB — VITAMIN B12: VITAMIN B 12: 256 pg/mL (ref 211–911)

## 2016-06-15 NOTE — Progress Notes (Signed)
NEUROLOGY FOLLOW UP OFFICE NOTE  AIRI PHILLIPE CZ:[id number]  HISTORY OF PRESENT ILLNESS: Morgan Herrera is a 65 year old right-handed female with multiple sclerosis, PMR and migraine who follows up for multiple sclerosis.  UPDATE: She is currently taking Tecfidera.  She is also taking D3 2000 IU daily.  CBC from 06/11/16 showed WBC 4.3, hemoglobin 13, HCT 38.9, PLT 231 and ALC 0.8.  She underwent neuropsychological testing in July which revealed anxiety and depression but no cognitive impairment.  She reports uncomfortable flushing and itching with the Tecfidera.  She had to stop the ASA because it caused two retinal hemorrhages.  She reports burning and aching on the bottom of her feet when she walks for prolonged period.   HISTORY: She was diagnosed with MS at age 64, when she presented with bilateral optic neuritis, right worse than left.  She was treated with IV steroids at that time..  In hindsight, she recalls brief episodes from her past.  In her late-20s, she had a 3 week episode of dizziness and ataxia.  She had brief episodes of limb paresthesias lasting a couple of days.   Work up included MRIs.  MRIs of the brain have shown lesions suspicious for MS.  Prior cervical MRI reportedly normal.   She never had a relapse and therefore never required further IV steroids.  However, she was diagnosed with polymyalgia rheumatica 5 years ago, after experiencing severe joint pain and stiffness.  She had been on prednisone for five years and was tapered off a few months ago.   She occasionally has fatigue once in a while, sometimes lasting for 2 weeks.  She denies problems with gait or balance.  She does not exhibit Lhermitte's sign. She works part-time as a Radiation protection practitioner.  For the past couple of years, she reports word-finding difficulty.  Sometimes it is difficult to concentrate.  It has almost affected her work at times.  She has a BA in [place].   She is currently treated with  Tecfidera 240mg  twice daily.  She has been on Tecfidera for 2 years.  She takes ASA 81mg  daily to treat the associated hot flashes.   Prior disease modifying drugs include Avonex.  She stopped because she was tired of injections.   Most recent MRI of brain with and without contrast from 09/06/15 showed chronic non-enhancing T2/FLAIR hyperintensities in the periventricular white matter, some demonstrating Dawson's fingers, with mild corpus callosum involvement, which appears stable compared to 2012 and similar to imaging from 2005.   Labs from 06/16/14 include Sed Rate 9 and CRP less than 0.5.   Previously followed by Dr. Corrinne Eagle in [place].  She moved to [place] from [place] a year ago.  PAST MEDICAL HISTORY: Past Medical History:  Diagnosis Date  . Migraine   . Multiple sclerosis ([place])   . Neuromuscular disorder ([place])   . PMR (polymyalgia rheumatica) (H[facility])     MEDICATIONS: Current Outpatient Prescriptions on File Prior to Visit  Medication Sig Dispense Refill  . cholecalciferol (VITAMIN D) 1000 units tablet Take 2,000 Units by mouth daily.    . Dimethyl Fumarate 240 MG CPDR Take 1 capsule (240 mg total) by mouth 2 (two) times daily. 180 capsule 2  . escitalopram (LEXAPRO) 5 MG tablet Take 1 tablet by mouth  daily 90 tablet 1  . furosemide (LASIX) 20 MG tablet Take 1 tablet (20 mg total) by mouth daily. 90 tablet 2  . zolpidem (AMBIEN) 10 MG tablet Take 0.5-1 tablets (  5-10 mg total) by mouth at bedtime as needed for sleep. 90 tablet 2  . aspirin 81 MG tablet Take 81 mg by mouth daily.     No current facility-administered medications on file prior to visit.     ALLERGIES: No Known Allergies  FAMILY HISTORY: Family History  Problem Relation Age of Onset  . Heart disease Mother   . Hypertension Mother   . Parkinson's disease Mother   . Parkinsonism Mother   . Cancer Maternal Grandmother     breast  . Asthma Father     SOCIAL HISTORY: Social History   Social  History  . Marital status: Married    Spouse name: N/A  . Number of children: N/A  . Years of education: N/A   Occupational History  . Not on file.   Social History Main Topics  . Smoking status: Former Research scientist (life sciences)  . Smokeless tobacco: Not on file  . Alcohol use 0.0 oz/week     Comment: 1 to 2 glass of wine at least 4 nights a week  . Drug use: No  . Sexual activity: Not on file   Other Topics Concern  . Not on file   Social History Narrative   Married.   Moved here from [place] after 35 years   Enjoys arts and crafts. Taking a painting class.   Walking with her spouse.    Reading, shopping.    REVIEW OF SYSTEMS: Constitutional: No fevers, chills, or sweats, no generalized fatigue, change in appetite Eyes: No visual changes, double vision, eye pain Ear, nose and throat: No hearing loss, ear pain, nasal congestion, sore throat Cardiovascular: No chest pain, palpitations Respiratory:  No shortness of breath at rest or with exertion, wheezes GastrointestinaI: No nausea, vomiting, diarrhea, abdominal pain, fecal incontinence Genitourinary:  No dysuria, urinary retention or frequency Musculoskeletal:  No neck pain, back pain Integumentary: No rash, pruritus, skin lesions Neurological: as above Psychiatric: No depression, insomnia, anxiety Endocrine: No palpitations, fatigue, diaphoresis, mood swings, change in appetite, change in weight, increased thirst Hematologic/Lymphatic:  No purpura, petechiae. Allergic/Immunologic: no itchy/runny eyes, nasal congestion, recent allergic reactions, rashes  PHYSICAL EXAM: Vitals:   06/15/16 0904  BP: 112/66  Pulse: (!) 59   General: No acute distress.  Patient appears well-groomed.  normal body habitus. Head:  Normocephalic/atraumatic Eyes:  Fundi examined but not visualized Neck: supple, no paraspinal tenderness, full range of motion Heart:  Regular rate and rhythm Lungs:  Clear to auscultation bilaterally Back: No paraspinal  tenderness Neurological Exam: alert and oriented to person, place, and time. Attention span and concentration intact, recent and remote memory intact, fund of knowledge intact.  Speech fluent and not dysarthric, language intact.  significant peripheral vision loss in right eye.  Otherwise, CN II-XII intact. Bulk and tone normal, muscle strength 5/5 throughout.  Sensation to light touch, temperature and vibration intact.  Deep tendon reflexes 2+ throughout, except 3+ in patellars, toes downgoing., toes downgoing.  Timed 25 foot walk 4.40 seconds.  Finger to nose and heel to shin testing intact.  Gait normal, Romberg negative.  IMPRESSION: Relapsing remitting MS Pain in feet  PLAN: 1.  Due to side effects, we will switch from Tecfidera to Aubagio.  We will check baseline labs. 2.  Repeat LFTs every month for 6 months, then every 6 months. 3.  Repeat CBC with diff and MRI of brain with and without contrast in 6 months. 4.  Continue vitamin D 5.  Check B12 level to evaluate  for cause of burning in feet.  Advised to also see podiatry. 6.  Follow up in 6 months after repeat MRI.  26 minutes spent face to face with patient, over 50% spent counseling.   Metta Clines, DO  CC:  Alma Friendly, NP

## 2016-06-15 NOTE — Patient Instructions (Signed)
1.  Due to side effects, we will switch from Tecfidera to Aubagio 2.  We will check baseline LFTs, pregnancy test and TB test 3.  We will check LFTs every month for the first 6 months then every 6 months. 4.  Repeat CBC with diff in 6 months, as well as MRI of brain with and without contrast in 6 months (prior to follow up) 5.  Will check B12 level 6.  Continue vitamin D

## 2016-06-15 NOTE — Addendum Note (Signed)
Addended by: Marchia Bond on: 06/15/2016 03:58 PM   Modules accepted: Orders

## 2016-06-16 LAB — HEPATIC FUNCTION PANEL
ALBUMIN: 4.6 g/dL (ref 3.6–5.1)
ALT: 18 U/L (ref 6–29)
AST: 21 U/L (ref 10–35)
Alkaline Phosphatase: 52 U/L (ref 33–130)
BILIRUBIN INDIRECT: 0.4 mg/dL (ref 0.2–1.2)
Bilirubin, Direct: 0.1 mg/dL (ref <=0.2)
Total Bilirubin: 0.5 mg/dL (ref 0.2–1.2)
Total Protein: 6.6 g/dL (ref 6.1–8.1)

## 2016-06-16 LAB — BILIRUBIN, TOTAL: BILIRUBIN TOTAL: 0.5 mg/dL (ref 0.2–1.2)

## 2016-06-18 ENCOUNTER — Other Ambulatory Visit: Payer: 59

## 2016-06-18 DIAGNOSIS — G35 Multiple sclerosis: Secondary | ICD-10-CM

## 2016-06-18 LAB — TB SKIN TEST: TB SKIN TEST: NEGATIVE

## 2016-06-19 ENCOUNTER — Encounter: Payer: Self-pay | Admitting: Neurology

## 2016-06-19 LAB — PREGNANCY, URINE: Preg Test, Ur: NEGATIVE

## 2016-06-21 NOTE — Telephone Encounter (Signed)
P.A. For Aubagio initiated via fax to [employer].  Did also received fax from [employer] one (aubagio's patient assistance program) Pt has been approved for co pay assistance. Eligibly from 06/21/16 -9/20/18ID# [id number]

## 2016-06-22 NOTE — Telephone Encounter (Signed)
Received fax from [facility] that Morgan Herrera was approved for coverage until 06/21/2021.  [id number]

## 2016-06-25 ENCOUNTER — Ambulatory Visit
Admission: RE | Admit: 2016-06-25 | Discharge: 2016-06-25 | Disposition: A | Discharge status: Home / Self Care | Payer: 59 | Source: Ambulatory Visit | Attending: Primary Care | Admitting: Primary Care

## 2016-06-25 DIAGNOSIS — Z1239 Encounter for other screening for malignant neoplasm of breast: Secondary | ICD-10-CM

## 2016-07-10 ENCOUNTER — Telehealth: Payer: Self-pay

## 2016-07-10 DIAGNOSIS — Z79899 Other long term (current) drug therapy: Secondary | ICD-10-CM

## 2016-07-10 NOTE — Telephone Encounter (Signed)
Spoke with Patient. Started taking Aubagio on 07/01/16. Will update my reminders.

## 2016-07-10 NOTE — Telephone Encounter (Signed)
-----   Message from Amada Kingfisher, [place] sent at 06/15/2016  9:45 AM EDT ----- Morgan Herrera LFT

## 2016-07-12 ENCOUNTER — Other Ambulatory Visit: Payer: Self-pay | Admitting: Primary Care

## 2016-07-12 DIAGNOSIS — R609 Edema, unspecified: Secondary | ICD-10-CM

## 2016-07-30 ENCOUNTER — Other Ambulatory Visit (INDEPENDENT_AMBULATORY_CARE_PROVIDER_SITE_OTHER): Payer: 59

## 2016-07-30 DIAGNOSIS — Z79899 Other long term (current) drug therapy: Secondary | ICD-10-CM

## 2016-07-31 LAB — HEPATIC FUNCTION PANEL
ALBUMIN: 4.5 g/dL (ref 3.5–5.2)
ALT: 20 U/L (ref 0–35)
AST: 21 U/L (ref 0–37)
Alkaline Phosphatase: 61 U/L (ref 39–117)
BILIRUBIN TOTAL: 0.5 mg/dL (ref 0.2–1.2)
Bilirubin, Direct: 0.1 mg/dL (ref 0.0–0.3)
Total Protein: 7.1 g/dL (ref 6.0–8.3)

## 2016-08-02 ENCOUNTER — Telehealth: Payer: Self-pay

## 2016-08-02 NOTE — Telephone Encounter (Signed)
-----   Message from Pieter Partridge, DO sent at 08/02/2016  7:15 AM EDT ----- Liver function is okay.  Repeat in one month.

## 2016-08-02 NOTE — Telephone Encounter (Signed)
My chart message sent w/ results.

## 2016-08-10 ENCOUNTER — Encounter: Payer: Self-pay | Admitting: Neurology

## 2016-08-27 ENCOUNTER — Telehealth: Payer: Self-pay

## 2016-08-27 DIAGNOSIS — G35 Multiple sclerosis: Secondary | ICD-10-CM

## 2016-08-27 DIAGNOSIS — Z79899 Other long term (current) drug therapy: Secondary | ICD-10-CM

## 2016-08-27 NOTE — Telephone Encounter (Signed)
Pt will come have labs drawn. While on the phone pt mentioned repeat MRI brain. After review she is due for requested 6 month f/u. MRI ordered. Pt aware.

## 2016-08-27 NOTE — Telephone Encounter (Signed)
-----   Message from Morgan Herrera, [place] sent at 06/15/2016  9:46 AM EDT ----- Lorel Monaco LFT

## 2016-08-30 ENCOUNTER — Other Ambulatory Visit (INDEPENDENT_AMBULATORY_CARE_PROVIDER_SITE_OTHER): Payer: 59

## 2016-08-30 DIAGNOSIS — Z79899 Other long term (current) drug therapy: Secondary | ICD-10-CM | POA: Diagnosis not present

## 2016-08-30 DIAGNOSIS — G35 Multiple sclerosis: Secondary | ICD-10-CM | POA: Diagnosis not present

## 2016-08-30 LAB — HEPATIC FUNCTION PANEL
ALK PHOS: 60 U/L (ref 39–117)
ALT: 16 U/L (ref 0–35)
AST: 21 U/L (ref 0–37)
Albumin: 4.7 g/dL (ref 3.5–5.2)
BILIRUBIN TOTAL: 0.4 mg/dL (ref 0.2–1.2)
Bilirubin, Direct: 0.1 mg/dL (ref 0.0–0.3)
Total Protein: 7.3 g/dL (ref 6.0–8.3)

## 2016-08-31 ENCOUNTER — Telehealth: Payer: Self-pay

## 2016-08-31 NOTE — Telephone Encounter (Signed)
Released via mychart w/ provider's notes.

## 2016-08-31 NOTE — Telephone Encounter (Signed)
-----   Message from Pieter Partridge, DO sent at 08/31/2016  7:09 AM EST ----- Liver tests normal

## 2016-09-10 ENCOUNTER — Other Ambulatory Visit: Payer: 59

## 2016-09-10 ENCOUNTER — Encounter: Payer: Self-pay | Admitting: Neurology

## 2016-10-08 ENCOUNTER — Telehealth: Payer: Self-pay | Admitting: Neurology

## 2016-10-08 DIAGNOSIS — Z79899 Other long term (current) drug therapy: Secondary | ICD-10-CM

## 2016-10-08 DIAGNOSIS — G35 Multiple sclerosis: Secondary | ICD-10-CM

## 2016-10-08 NOTE — Telephone Encounter (Signed)
Patient needs to talk to someone about blood work and about a Ashley for a medication please call [phone]

## 2016-10-08 NOTE — Telephone Encounter (Signed)
Spoke to patient. Requested monthly lab work ordered. Placed order. Patient verbalized understanding Patient says she has switched insurances; Aubagio PA initiated; fax should be sent to the office. Advised would be looking for fax.

## 2016-10-09 ENCOUNTER — Ambulatory Visit
Admission: RE | Admit: 2016-10-09 | Discharge: 2016-10-09 | Disposition: A | Discharge status: Home / Self Care | Payer: PPO | Source: Ambulatory Visit | Attending: Neurology | Admitting: Neurology

## 2016-10-09 DIAGNOSIS — G35 Multiple sclerosis: Secondary | ICD-10-CM

## 2016-10-09 MED ORDER — GADOBENATE DIMEGLUMINE 529 MG/ML IV SOLN
10.0000 mL | Freq: Once | INTRAVENOUS | Status: AC | PRN
  Administered 2016-10-09: 10 mL via INTRAVENOUS

## 2016-10-10 ENCOUNTER — Other Ambulatory Visit (INDEPENDENT_AMBULATORY_CARE_PROVIDER_SITE_OTHER): Payer: PPO

## 2016-10-10 ENCOUNTER — Telehealth: Payer: Self-pay

## 2016-10-10 DIAGNOSIS — G35 Multiple sclerosis: Secondary | ICD-10-CM

## 2016-10-10 DIAGNOSIS — Z79899 Other long term (current) drug therapy: Secondary | ICD-10-CM

## 2016-10-10 LAB — HEPATIC FUNCTION PANEL
ALK PHOS: 52 U/L (ref 39–117)
ALT: 18 U/L (ref 0–35)
AST: 22 U/L (ref 0–37)
Albumin: 4.3 g/dL (ref 3.5–5.2)
BILIRUBIN DIRECT: 0.1 mg/dL (ref 0.0–0.3)
TOTAL PROTEIN: 6.9 g/dL (ref 6.0–8.3)
Total Bilirubin: 0.4 mg/dL (ref 0.2–1.2)

## 2016-10-10 NOTE — Telephone Encounter (Signed)
Called patient. Gave MRI results. Patient verbalized understanding.  Requesting to know if Dr. Tomi Likens has received an urgent PA form for Aubagio  from [employer].  Advised nothing has came across my desk as of this morning. She says she will contact [employer] again.

## 2016-10-10 NOTE — Telephone Encounter (Signed)
-----   Message from Pieter Partridge, DO sent at 10/10/2016  7:39 AM EST ----- MRI of brain looks stable.  No evidence of disease progression

## 2016-10-11 ENCOUNTER — Other Ambulatory Visit: Payer: Self-pay

## 2016-10-11 DIAGNOSIS — G4700 Insomnia, unspecified: Secondary | ICD-10-CM

## 2016-10-11 DIAGNOSIS — R609 Edema, unspecified: Secondary | ICD-10-CM

## 2016-10-11 MED ORDER — ESCITALOPRAM OXALATE 5 MG PO TABS: 5.0000 mg | ORAL_TABLET | Freq: Every day | ORAL | 1 refills | Status: DC

## 2016-10-11 MED ORDER — ZOLPIDEM TARTRATE 10 MG PO TABS: 5.0000 mg | ORAL_TABLET | Freq: Every evening | ORAL | 1 refills | Status: DC | PRN

## 2016-10-11 MED ORDER — FUROSEMIDE 20 MG PO TABS: 20.0000 mg | ORAL_TABLET | Freq: Every day | ORAL | 1 refills | Status: DC

## 2016-10-11 NOTE — Telephone Encounter (Signed)
Pt left v/m; pt has changed ins and now has to use [facility] as pharmacy. Pt request refill lexapro and lasix and zolpidem (last refilled # 90 x 2 on 09/19/15. Pt last seen annual exam on 03/09/16. Lexapro and lasix refilled per protocol. Zolpidem request sent to Allie Bossier NP.

## 2016-10-11 NOTE — Telephone Encounter (Signed)
Patient stopped by office yesterday to check status of PA and to provide new insurance info. Patient states she was told Rogelia Rohrer would not be covered under new insurance and PA needed to be submitted to proceed. Called new insurance [facility]) to initiate PA. I was giving directions on how to download PA form and submit to [facility].  Submitted info to [facility].

## 2016-10-11 NOTE — Telephone Encounter (Signed)
Called in medication to the pharmacy as instructed. 

## 2016-10-11 NOTE — Telephone Encounter (Signed)
Informed patient

## 2016-10-12 NOTE — Telephone Encounter (Signed)
Spoke to patient and spouse. Samara Snide PA approved. From 10/11/2016 until 09/30/2017.  [id number]

## 2016-10-24 ENCOUNTER — Telehealth: Payer: Self-pay | Admitting: Neurology

## 2016-10-24 ENCOUNTER — Other Ambulatory Visit: Payer: Self-pay

## 2016-10-24 MED ORDER — TERIFLUNOMIDE 14 MG PO TABS: ORAL_TABLET | ORAL | 11 refills | Status: DC

## 2016-10-24 NOTE — Telephone Encounter (Signed)
Faxed Aubagio Rx to [employer] as requested by pharmacy. Fax#[phone]

## 2016-10-24 NOTE — Telephone Encounter (Signed)
Called patient. Informed Aubagio Rx faxed to [facility] today. Patient was grateful.

## 2016-10-24 NOTE — Telephone Encounter (Signed)
PT called and wanted to know if a request has been sent for her prescription refill/Dawn CB# [phone]

## 2016-11-02 ENCOUNTER — Telehealth: Payer: Self-pay

## 2016-11-02 DIAGNOSIS — G35 Multiple sclerosis: Secondary | ICD-10-CM

## 2016-11-02 NOTE — Telephone Encounter (Signed)
Called patient to inform placed orders for patient to have LFT's checked every 6 months from the month she started taking Aubagio and then every 6 months thereafter. No answer.  Placed lab order.

## 2016-11-05 NOTE — Telephone Encounter (Signed)
Spoke to patient. Gave lab info per previous note. Patient stated she will go to [facility] tomorrow to have blood drawn.

## 2016-11-07 ENCOUNTER — Other Ambulatory Visit (INDEPENDENT_AMBULATORY_CARE_PROVIDER_SITE_OTHER): Payer: PPO

## 2016-11-07 DIAGNOSIS — G35 Multiple sclerosis: Secondary | ICD-10-CM | POA: Diagnosis not present

## 2016-11-07 LAB — HEPATIC FUNCTION PANEL
ALK PHOS: 54 U/L (ref 39–117)
ALT: 15 U/L (ref 0–35)
AST: 19 U/L (ref 0–37)
Albumin: 4.6 g/dL (ref 3.5–5.2)
BILIRUBIN DIRECT: 0.1 mg/dL (ref 0.0–0.3)
BILIRUBIN TOTAL: 0.5 mg/dL (ref 0.2–1.2)
TOTAL PROTEIN: 7.2 g/dL (ref 6.0–8.3)

## 2016-11-30 ENCOUNTER — Other Ambulatory Visit (INDEPENDENT_AMBULATORY_CARE_PROVIDER_SITE_OTHER): Payer: PPO

## 2016-11-30 DIAGNOSIS — G35 Multiple sclerosis: Secondary | ICD-10-CM | POA: Diagnosis not present

## 2016-11-30 LAB — HEPATIC FUNCTION PANEL
ALT: 20 U/L (ref 0–35)
AST: 23 U/L (ref 0–37)
Albumin: 4.5 g/dL (ref 3.5–5.2)
Alkaline Phosphatase: 51 U/L (ref 39–117)
BILIRUBIN DIRECT: 0.1 mg/dL (ref 0.0–0.3)
BILIRUBIN TOTAL: 0.5 mg/dL (ref 0.2–1.2)
Total Protein: 7.1 g/dL (ref 6.0–8.3)

## 2016-12-06 ENCOUNTER — Telehealth: Payer: Self-pay | Admitting: *Deleted

## 2016-12-06 ENCOUNTER — Encounter: Payer: Self-pay | Admitting: *Deleted

## 2016-12-06 NOTE — Telephone Encounter (Signed)
Patient notified via My Chart

## 2016-12-06 NOTE — Telephone Encounter (Signed)
-----   Message from Pieter Partridge, DO sent at 12/06/2016  7:17 AM EST ----- Labs normal.  LFTs should be repeated again next month.

## 2016-12-12 ENCOUNTER — Ambulatory Visit (INDEPENDENT_AMBULATORY_CARE_PROVIDER_SITE_OTHER): Payer: PPO | Admitting: Neurology

## 2016-12-12 ENCOUNTER — Encounter: Payer: Self-pay | Admitting: Neurology

## 2016-12-12 VITALS — BP 104/70 | HR 68 | Ht 66.0 in | Wt 120.7 lb

## 2016-12-12 DIAGNOSIS — G35 Multiple sclerosis: Secondary | ICD-10-CM | POA: Diagnosis not present

## 2016-12-12 NOTE — Patient Instructions (Signed)
1.  Continue Aubagio, B12 and vitamin d 2.  Recheck CBC with diff and hepatic panel in one month and again in 6 months. 3.  Follow up in 6 months after repeat labs.

## 2016-12-12 NOTE — Progress Notes (Addendum)
NEUROLOGY FOLLOW UP OFFICE NOTE  Morgan Herrera [id number]  HISTORY OF PRESENT ILLNESS: Morgan Herrera is a 66 year old right-handed female with multiple sclerosis, PMR and migraine who follows up for multiple sclerosis.   UPDATE: Due to side effects to Tecfidera (nausea, flushing and unable to take aspirin), she was switched to Aubagio.  TB and pregnancy negative.    Hepatic function tests normal:   06/15/16:  Total bili 0.5, ALP 52, AST 21 and ALT 18   07/30/16:  Total bili 0.5, ALP 61, AST 21 and ALT 20   08/30/16:  Total bili 0.5, ALP 60, AST 21 and ALT 16   10/10/16:  Total bili 0.4, ALP 52, AST 22 and ALT 18   11/07/16:  Total bili 0.5, ALP 54, AST 19 an d ALT 15   11/30/16:  Total bili 0.5, ALP 51, AST 23, and ALT 20 B12 was 256.  She is taking B12 supplementation. MRI of brain with and without contrast from 10/09/16 was personally reviewed and was stable. She is tolerating the Aubagio.     HISTORY: She was diagnosed with MS at age 62, when she presented with bilateral optic neuritis, right worse than left.  She was treated with IV steroids at that time..  In hindsight, she recalls brief episodes from her past.  In her late-20s, she had a 3 week episode of dizziness and ataxia.  She had brief episodes of limb paresthesias lasting a couple of days.   Work up included MRIs.  MRIs of the brain have shown lesions suspicious for MS.  Prior cervical MRI reportedly normal.   She never had a relapse and therefore never required further IV steroids.  However, she was diagnosed with polymyalgia rheumatica several years ago, after experiencing severe joint pain and stiffness.  She had been on prednisone for five years and was tapered off a few months ago.   She occasionally has fatigue once in a while, sometimes lasting for 2 weeks.  She denies problems with gait or balance.  She does not exhibit Lhermitte's sign.  She reports burning and aching on the bottom of her feet when she walks for  prolonged period.  She works part-time as a Radiation protection practitioner.  For the past couple of years, she reports word-finding difficulty.  Sometimes it is difficult to concentrate.  It has almost affected her work at times.  She has a BA in [place].  She underwent neuropsychological testing in July 2017, which revealed anxiety and depression but no cognitive impairment.   She is currently treated with Tecfidera 240mg  twice daily.  She has been on Tecfidera for 2 years.  She takes ASA 81mg  daily to treat the associated hot flashes.   Prior disease modifying drugs include Avonex.  She stopped because she was tired of injections.   Most recent MRI of brain with and without contrast from 09/06/15 showed chronic non-enhancing T2/FLAIR hyperintensities in the periventricular white matter, some demonstrating Dawson's fingers, with mild corpus callosum involvement, which appears stable compared to 2012 and similar to imaging from 2005.  PAST MEDICAL HISTORY: Past Medical History:  Diagnosis Date  . Migraine   . Multiple sclerosis ([place])   . Neuromuscular disorder ([place])   . PMR (polymyalgia rheumatica) (HCC)     MEDICATIONS: Current Outpatient Prescriptions on File Prior to Visit  Medication Sig Dispense Refill  . cholecalciferol (VITAMIN D) 1000 units tablet Take 2,000 Units by mouth daily.    Marland Kitchen escitalopram (LEXAPRO) 5 MG tablet  Take 1 tablet (5 mg total) by mouth daily. 90 tablet 1  . furosemide (LASIX) 20 MG tablet Take 1 tablet (20 mg total) by mouth daily. 90 tablet 1  . Teriflunomide (AUBAGIO) 14 MG TABS Take 1 tablet by mouth daily. 30 tablet 11  . zolpidem (AMBIEN) 10 MG tablet Take 0.5-1 tablets (5-10 mg total) by mouth at bedtime as needed for sleep. 90 tablet 1  . aspirin 81 MG tablet Take 81 mg by mouth daily.     No current facility-administered medications on file prior to visit.     ALLERGIES: No Known Allergies  FAMILY HISTORY: Family History  Problem Relation Age of Onset  . Heart  disease Mother   . Hypertension Mother   . Parkinson's disease Mother   . Parkinsonism Mother   . Cancer Maternal Grandmother     breast  . Asthma Father     SOCIAL HISTORY: Social History   Social History  . Marital status: Married    Spouse name: N/A  . Number of children: N/A  . Years of education: N/A   Occupational History  . Not on file.   Social History Main Topics  . Smoking status: Former Research scientist (life sciences)  . Smokeless tobacco: Never Used  . Alcohol use 0.0 oz/week     Comment: 1 to 2 glass of wine at least 4 nights a week  . Drug use: No  . Sexual activity: Not on file   Other Topics Concern  . Not on file   Social History Narrative   Married.   Moved here from [place] after 35 years   Enjoys arts and crafts. Taking a painting class.   Walking with her spouse.    Reading, shopping.    REVIEW OF SYSTEMS: Constitutional: No fevers, chills, or sweats, no generalized fatigue, change in appetite Eyes: No visual changes, double vision, eye pain Ear, nose and throat: No hearing loss, ear pain, nasal congestion, sore throat Cardiovascular: No chest pain, palpitations Respiratory:  No shortness of breath at rest or with exertion, wheezes GastrointestinaI: No nausea, vomiting, diarrhea, abdominal pain, fecal incontinence Genitourinary:  No dysuria, urinary retention or frequency Musculoskeletal:  No neck pain, back pain Integumentary: No rash, pruritus, skin lesions Neurological: as above Psychiatric: No depression, insomnia, anxiety Endocrine: No palpitations, fatigue, diaphoresis, mood swings, change in appetite, change in weight, increased thirst Hematologic/Lymphatic:  No purpura, petechiae. Allergic/Immunologic: no itchy/runny eyes, nasal congestion, recent allergic reactions, rashes  PHYSICAL EXAM: Vitals:   12/12/16 1007  BP: 104/70  Pulse: 68   General: No acute distress.  Patient appears well-groomed.  normal body habitus. Head:   Normocephalic/atraumatic Eyes:  Fundi examined but not visualized Neck: supple, no paraspinal tenderness, full range of motion Heart:  Regular rate and rhythm Lungs:  Clear to auscultation bilaterally Back: No paraspinal tenderness Neurological Exam: alert and oriented to person, place, and time. Attention span and concentration intact, recent and remote memory intact, fund of knowledge intact.  Speech fluent and not dysarthric, language intact.  Right sided peripheral vision loss.  Otherwise, CN II-XII intact. Bulk and tone normal, muscle strength 5-/5 deltoids, otherwise 5/5 throughout.  Sensation to temperature and vibration intact.  Deep tendon reflexes 2+ in upper extremities, 3+ lower extremities, toes downgoing.  Finger to nose and heel to shin testing intact.  Gait normal, Timed 25 foot walk 4.73 seconds.  Romberg negative.  IMPRESSION: Relapsing remitting MS  PLAN: 1. Aubagio.  2.  Repeat hepatic panel and CBC with  diff in one month, and then every 6 months thereafter (pending labs are within normal limits) 3.  Continue B12 4.  Continue D3 2000 IU daily 5.  Follow up in 6 months (repeat CBC with diff and hepatic panel just prior to follow up visit)  20 minutes spent face to face with patient, over 50% spent discussing medication tolerance, status update and management.  Metta Clines, DO  CC: Alma Friendly, NP

## 2016-12-14 ENCOUNTER — Ambulatory Visit: Payer: 59 | Admitting: Neurology

## 2016-12-31 ENCOUNTER — Other Ambulatory Visit (INDEPENDENT_AMBULATORY_CARE_PROVIDER_SITE_OTHER): Payer: PPO

## 2016-12-31 DIAGNOSIS — G35 Multiple sclerosis: Secondary | ICD-10-CM | POA: Diagnosis not present

## 2016-12-31 LAB — CBC WITH DIFFERENTIAL/PLATELET
BASOS ABS: 0.1 10*3/uL (ref 0.0–0.1)
Basophils Relative: 2.2 % (ref 0.0–3.0)
Eosinophils Absolute: 0.1 10*3/uL (ref 0.0–0.7)
Eosinophils Relative: 2.2 % (ref 0.0–5.0)
HCT: 36.6 % (ref 36.0–46.0)
HEMOGLOBIN: 12.2 g/dL (ref 12.0–15.0)
LYMPHS ABS: 1.3 10*3/uL (ref 0.7–4.0)
Lymphocytes Relative: 22.5 % (ref 12.0–46.0)
MCHC: 33.4 g/dL (ref 30.0–36.0)
MCV: 86.5 fl (ref 78.0–100.0)
MONO ABS: 0.7 10*3/uL (ref 0.1–1.0)
Monocytes Relative: 12 % (ref 3.0–12.0)
NEUTROS PCT: 61.1 % (ref 43.0–77.0)
Neutro Abs: 3.6 10*3/uL (ref 1.4–7.7)
Platelets: 253 10*3/uL (ref 150.0–400.0)
RBC: 4.23 Mil/uL (ref 3.87–5.11)
RDW: 13.3 % (ref 11.5–15.5)
WBC: 6 10*3/uL (ref 4.0–10.5)

## 2016-12-31 LAB — HEPATIC FUNCTION PANEL
ALK PHOS: 53 U/L (ref 39–117)
ALT: 23 U/L (ref 0–35)
AST: 23 U/L (ref 0–37)
Albumin: 4.5 g/dL (ref 3.5–5.2)
BILIRUBIN DIRECT: 0.1 mg/dL (ref 0.0–0.3)
BILIRUBIN TOTAL: 0.4 mg/dL (ref 0.2–1.2)
Total Protein: 6.8 g/dL (ref 6.0–8.3)

## 2017-01-01 ENCOUNTER — Telehealth: Payer: Self-pay

## 2017-01-01 DIAGNOSIS — G35 Multiple sclerosis: Secondary | ICD-10-CM

## 2017-01-01 NOTE — Telephone Encounter (Signed)
-----   Message from Pieter Partridge, DO sent at 01/01/2017  6:46 AM EDT ----- Blood work looks okay.  We can now repeat hepatic panel in September (prior to follow up).

## 2017-01-01 NOTE — Telephone Encounter (Signed)
Spoke to patient. Gave results. Patient verbalized understanding. Ordered Hepatic panel for future collection.

## 2017-03-25 ENCOUNTER — Other Ambulatory Visit: Payer: Self-pay | Admitting: Primary Care

## 2017-03-25 DIAGNOSIS — E559 Vitamin D deficiency, unspecified: Secondary | ICD-10-CM

## 2017-03-25 DIAGNOSIS — E785 Hyperlipidemia, unspecified: Secondary | ICD-10-CM

## 2017-04-01 ENCOUNTER — Other Ambulatory Visit: Payer: Self-pay | Admitting: Primary Care

## 2017-04-01 DIAGNOSIS — R609 Edema, unspecified: Secondary | ICD-10-CM

## 2017-04-02 ENCOUNTER — Telehealth: Payer: Self-pay | Admitting: Primary Care

## 2017-04-02 NOTE — Telephone Encounter (Signed)
Received faxed request from pharmacy the Prior Auth for Ambien 10 mg.  Send the Prior Auth to [employer]. Waiting on reply.

## 2017-04-04 ENCOUNTER — Other Ambulatory Visit (INDEPENDENT_AMBULATORY_CARE_PROVIDER_SITE_OTHER): Payer: PPO

## 2017-04-04 DIAGNOSIS — E785 Hyperlipidemia, unspecified: Secondary | ICD-10-CM | POA: Diagnosis not present

## 2017-04-04 DIAGNOSIS — E559 Vitamin D deficiency, unspecified: Secondary | ICD-10-CM | POA: Diagnosis not present

## 2017-04-04 LAB — LIPID PANEL
CHOL/HDL RATIO: 3
CHOLESTEROL: 214 mg/dL — AB (ref 0–200)
HDL: 75 mg/dL (ref >39.00)
LDL Cholesterol: 118 mg/dL — ABNORMAL HIGH (ref 0–99)
NonHDL: 139.03
TRIGLYCERIDES: 107 mg/dL (ref 0.0–149.0)
VLDL: 21.4 mg/dL (ref 0.0–40.0)

## 2017-04-04 LAB — VITAMIN D 25 HYDROXY (VIT D DEFICIENCY, FRACTURES): VITD: 51.24 ng/mL (ref 30.00–100.00)

## 2017-04-04 LAB — BASIC METABOLIC PANEL
BUN: 19 mg/dL (ref 6–23)
CHLORIDE: 105 meq/L (ref 96–112)
CO2: 31 meq/L (ref 19–32)
CREATININE: 0.68 mg/dL (ref 0.40–1.20)
Calcium: 9.7 mg/dL (ref 8.4–10.5)
GFR: 92.14 mL/min (ref >60.00)
Glucose, Bld: 95 mg/dL (ref 70–99)
POTASSIUM: 4.2 meq/L (ref 3.5–5.1)
Sodium: 142 mEq/L (ref 135–145)

## 2017-04-05 ENCOUNTER — Other Ambulatory Visit: Payer: PPO

## 2017-04-10 ENCOUNTER — Encounter: Payer: Self-pay | Admitting: Primary Care

## 2017-04-10 ENCOUNTER — Ambulatory Visit (INDEPENDENT_AMBULATORY_CARE_PROVIDER_SITE_OTHER): Payer: PPO | Admitting: Primary Care

## 2017-04-10 VITALS — BP 108/70 | HR 54 | Temp 97.9°F | Ht 66.0 in | Wt 119.4 lb

## 2017-04-10 DIAGNOSIS — Z23 Encounter for immunization: Secondary | ICD-10-CM

## 2017-04-10 DIAGNOSIS — Z0000 Encounter for general adult medical examination without abnormal findings: Secondary | ICD-10-CM | POA: Diagnosis not present

## 2017-04-10 DIAGNOSIS — G4700 Insomnia, unspecified: Secondary | ICD-10-CM | POA: Diagnosis not present

## 2017-04-10 DIAGNOSIS — Z1239 Encounter for other screening for malignant neoplasm of breast: Secondary | ICD-10-CM

## 2017-04-10 DIAGNOSIS — F3342 Major depressive disorder, recurrent, in full remission: Secondary | ICD-10-CM

## 2017-04-10 DIAGNOSIS — E559 Vitamin D deficiency, unspecified: Secondary | ICD-10-CM

## 2017-04-10 DIAGNOSIS — Z1231 Encounter for screening mammogram for malignant neoplasm of breast: Secondary | ICD-10-CM | POA: Diagnosis not present

## 2017-04-10 DIAGNOSIS — G35 Multiple sclerosis: Secondary | ICD-10-CM

## 2017-04-10 NOTE — Assessment & Plan Note (Signed)
Doing well on Ambien 1/2 tablet HS. Refill provided today.

## 2017-04-10 NOTE — Assessment & Plan Note (Signed)
Following with Neurology. Recommended she continue Yoga for balance and strength. Doing well overall.

## 2017-04-10 NOTE — Assessment & Plan Note (Signed)
Doing well on Lexapro, continue same.  

## 2017-04-10 NOTE — Progress Notes (Signed)
Patient ID: Morgan Herrera, female   DOB: 1951/08/16, 66 y.o.   MRN: [id number]  Morgan Herrera is a 66 year old female who presents today for her Welcome To Medicare Visit.  HPI:  Past Medical History:  Diagnosis Date  . Migraine   . Multiple sclerosis ([place])   . Neuromuscular disorder ([place])   . PMR (polymyalgia rheumatica) (HCC)     Current Outpatient Prescriptions  Medication Sig Dispense Refill  . aspirin 81 MG tablet Take 81 mg by mouth daily.    Marland Kitchen BIOTIN PO Take 1 tablet by mouth daily.    . cholecalciferol (VITAMIN D) 1000 units tablet Take 2,000 Units by mouth daily.    Marland Kitchen escitalopram (LEXAPRO) 5 MG tablet TAKE 1 TABLET (5 MG TOTAL) BY MOUTH DAILY. 90 tablet 1  . furosemide (LASIX) 20 MG tablet TAKE 1 TABLET (20 MG TOTAL) BY MOUTH DAILY. 90 tablet 1  . Teriflunomide (AUBAGIO) 14 MG TABS Take 1 tablet by mouth daily. 30 tablet 11  . zolpidem (AMBIEN) 10 MG tablet Take 0.5-1 tablets (5-10 mg total) by mouth at bedtime as needed for sleep. 90 tablet 1   No current facility-administered medications for this visit.     No Known Allergies  Family History  Problem Relation Age of Onset  . Heart disease Mother   . Hypertension Mother   . Parkinson's disease Mother   . Parkinsonism Mother   . Cancer Maternal Grandmother        breast  . Asthma Father     Social History   Social History  . Marital status: Married    Spouse name: N/A  . Number of children: N/A  . Years of education: N/A   Occupational History  . Not on file.   Social History Main Topics  . Smoking status: Former Research scientist (life sciences)  . Smokeless tobacco: Never Used  . Alcohol use 0.0 oz/week     Comment: 1 to 2 glass of wine at least 4 nights a week  . Drug use: No  . Sexual activity: Not on file   Other Topics Concern  . Not on file   Social History Narrative   Married.   Moved here from [place] after 35 years   Enjoys arts and crafts. Taking a painting class.   Walking with her spouse.    Reading,  shopping.    Hospitiliaztions: None  Health Maintenance:    Flu: Due this season  Tetanus: Completed in 2016  Pneumovax: Completed in 2016  Prevnar: Due.  Zostavax: Completed in 2012  Bone Density: Completed in 2016  Colonoscopy: Completed in 2016  Eye Doctor: Completed in March 2018  Dental Exam: Completes semi-annually  Mammogram: Completed in September 2018  Pap: Unsure of last Pap. Thinks it was 2 years ago.  Hep C Screening: Completed in 2017, negative    Providers: Alma Friendly, PCP; Dr. Loretta Plume, Neurology.   I have personally reviewed and have noted: 1. The patient's medical and social history 2. Their use of alcohol, tobacco or illicit drugs 3. Their current medications and supplements 4. The patient's functional ability including ADL's, fall risks, home safety risks and  hearing or visual impairment. 5. Diet and physical activities 6. Evidence for depression or mood disorder  Subjective:   Review of Systems:   Constitutional: Denies fever, malaise, fatigue, headache or abrupt weight changes.  HEENT: Denies eye pain, eye redness, ear pain, ringing in the ears, wax buildup, runny nose, nasal congestion, bloody nose, or  sore throat. Respiratory: Denies difficulty breathing, shortness of breath, cough or sputum production.   Cardiovascular: Denies chest pain, chest tightness, palpitations or swelling in the hands or feet.  Gastrointestinal: Denies abdominal pain, bloating, constipation, diarrhea or blood in the stool.  GU: Denies urgency, frequency, pain with urination, burning sensation, blood in urine, odor or discharge. Musculoskeletal: Denies decrease in range of motion, difficulty with gait, muscle pain or joint pain and swelling. Strength overall good, doing Yoga three times weekly. Following with neurology for MS. Skin: Denies redness, rashes, lesions or ulcercations.  Neurological: Denies dizziness, difficulty with memory, difficulty with speech. Yoga is  helping with balance and coordination. Psychiatric: Denies concerns for anxiety or depression. Feels well managed on Lexapro.  No other specific complaints in a complete review of systems (except as listed in HPI above).  Objective:  PE:   BP 108/70   Pulse (!) 54   Temp 97.9 F (36.6 C) (Oral)   Ht 5\' 6"  (1.676 m)   Wt 119 lb 6.4 oz (54.2 kg)   SpO2 98%   BMI 19.27 kg/m  Wt Readings from Last 3 Encounters:  04/10/17 119 lb 6.4 oz (54.2 kg)  12/12/16 120 lb 11.2 oz (54.7 kg)  06/15/16 124 lb (56.2 kg)    General: Appears their stated age, well developed, well nourished in NAD. Skin: Warm, dry and intact. No rashes, lesions or ulcerations noted. HEENT: Head: normal shape and size; Eyes: sclera white, no icterus, conjunctiva pink, PERRLA and EOMs intact; Ears: Tm's gray and intact, normal light reflex; Nose: mucosa pink and moist, septum midline; Throat/Mouth: Teeth present, mucosa pink and moist, no exudate, lesions or ulcerations noted.  Neck: Normal range of motion. Neck supple, trachea midline. No massses, lumps or thyromegaly present.  Cardiovascular: Normal rate and rhythm. S1,S2 noted.  No murmur, rubs or gallops noted. No JVD or BLE edema. No carotid bruits noted. Pulmonary/Chest: Normal effort and positive vesicular breath sounds. No respiratory distress. No wheezes, rales or ronchi noted.  Abdomen: Soft and nontender. Normal bowel sounds, no bruits noted. No distention or masses noted. Liver, spleen and kidneys non palpable. Musculoskeletal: Normal range of motion. No signs of joint swelling. No difficulty with gait. Strength to bilateral upper and lower extremities 4/5 which is baseline. Neurological: Alert and oriented. Cranial nerves II-XII intact. Coordination normal. +DTRs bilaterally-slightly hyper-reflexive which is baseline. Psychiatric: Mood and affect normal. Behavior is normal. Judgment and thought content normal.   EKG: Sinus bradycardia, no ST abnormality,  normal axis, no t-wave inversion.  BMET    Component Value Date/Time   NA 142 04/04/2017 0908   K 4.2 04/04/2017 0908   CL 105 04/04/2017 0908   CO2 31 04/04/2017 0908   GLUCOSE 95 04/04/2017 0908   BUN 19 04/04/2017 0908   CREATININE 0.68 04/04/2017 0908   CALCIUM 9.7 04/04/2017 0908    Lipid Panel     Component Value Date/Time   CHOL 214 (H) 04/04/2017 0908   TRIG 107.0 04/04/2017 0908   HDL 75.00 04/04/2017 0908   CHOLHDL 3 04/04/2017 0908   VLDL 21.4 04/04/2017 0908   LDLCALC 118 (H) 04/04/2017 0908    CBC    Component Value Date/Time   WBC 6.0 12/31/2016 1459   RBC 4.23 12/31/2016 1459   HGB 12.2 12/31/2016 1459   HCT 36.6 12/31/2016 1459   PLT 253.0 12/31/2016 1459   MCV 86.5 12/31/2016 1459   MCH 28.3 06/15/2016 1138   MCHC 33.4 12/31/2016 1459  RDW 13.3 12/31/2016 1459   LYMPHSABS 1.3 12/31/2016 1459   MONOABS 0.7 12/31/2016 1459   EOSABS 0.1 12/31/2016 1459   BASOSABS 0.1 12/31/2016 1459    Hgb A1C Lab Results  Component Value Date   HGBA1C 5.2 03/01/2015      Assessment and Plan:   Medicare Annual Wellness Visit:  Diet: She endorses a fair diet. Breakfast:  Lunch: Tuna salad, crackers Dinner: Meat, vegetables, salad, restaurant twice weekly, occasional pizza Snacks: Crackers, chicken salad Desserts: 1 cookie after dinner. Beverages: Water, wine with dinner Physical activity: Completed Yoga three times weekly, walking in the afternoon. Depression/mood screen: Negative Hearing: Intact to whispered voice Visual acuity: Grossly normal, performs annual eye exam  ADLs: Capable Fall risk: None Home safety: Good Cognitive evaluation: Intact to orientation, naming, recall and repetition EOL planning: Adv directives completed, HCPA is husband  Preventative Medicine: Prevnar due, provided today. All other immunizations UTD. Mammogram due in September, orders placed. Pap due, she declines and would like to defer until next year. Colonoscopy UTD.  Bone density scan UTD. ECG completed today. Exam today stable. Labs with stable low hyperlipidemia, discussed changes in diet. Continue regular exercise. All recommendations were provided at end of visit.  Next appointment: 1 year.

## 2017-04-10 NOTE — Telephone Encounter (Signed)
Received faxed sheet stating that patient has been approved for  Ambien 10 mg tablet will be approved from 04/04/2017 until 09/30/2017.

## 2017-04-10 NOTE — Addendum Note (Signed)
Addended by: Jacqualin Combes on: 04/10/2017 04:41 PM   Modules accepted: Orders

## 2017-04-10 NOTE — Assessment & Plan Note (Signed)
Prevnar due, provided today. All other immunizations UTD. Mammogram due in September, orders placed. Pap due, she declines and would like to defer until next year. Colonoscopy UTD. Bone density scan UTD. ECG completed today. Exam today stable. Labs with stable low hyperlipidemia, discussed changes in diet. Continue regular exercise. All recommendations were provided at end of visit.  I have personally reviewed and have noted: 1. The patient's medical and social history 2. Their use of alcohol, tobacco or illicit drugs 3. Their current medications and supplements 4. The patient's functional ability including ADL's, fall  risks, home safety risks and  hearing or visual  impairment. 5. Diet and physical activities 6. Evidence for depression or mood disorder

## 2017-04-10 NOTE — Assessment & Plan Note (Signed)
Stable today, continue OTC vitamin D.

## 2017-04-10 NOTE — Patient Instructions (Signed)
You were provided with a pneumonia vaccination today.  Work on [employer] as discussed. Continue regular exercise.  Your ECG looks good!  Schedule your mammogram in September 2018 when due.  We will do your Pap next year.  Follow up in 1 year or sooner if needed.  It was a pleasure to see you today!

## 2017-04-14 ENCOUNTER — Other Ambulatory Visit: Payer: Self-pay | Admitting: Primary Care

## 2017-04-14 DIAGNOSIS — G4700 Insomnia, unspecified: Secondary | ICD-10-CM

## 2017-04-15 NOTE — Telephone Encounter (Signed)
Rx called to pharmacy as instructed. 

## 2017-04-15 NOTE — Telephone Encounter (Signed)
Ok to refill? Electronically refill request for Take 0.5-1 tablets (5-10 mg total) by mouth at bedtime as needed for sleep.  Last prescribed on 10/11/2016. Last seen on 04/10/2017

## 2017-06-14 ENCOUNTER — Other Ambulatory Visit (INDEPENDENT_AMBULATORY_CARE_PROVIDER_SITE_OTHER): Payer: PPO

## 2017-06-14 ENCOUNTER — Ambulatory Visit: Payer: PPO | Admitting: Neurology

## 2017-06-14 DIAGNOSIS — G35 Multiple sclerosis: Secondary | ICD-10-CM | POA: Diagnosis not present

## 2017-06-14 LAB — HEPATIC FUNCTION PANEL
AG Ratio: 2 (calc) (ref 1.0–2.5)
ALBUMIN MSPROF: 4.5 g/dL (ref 3.6–5.1)
ALT: 19 U/L (ref 6–29)
AST: 22 U/L (ref 10–35)
Alkaline phosphatase (APISO): 52 U/L (ref 33–130)
Bilirubin, Direct: 0.1 mg/dL (ref 0.0–0.2)
GLOBULIN: 2.2 g/dL (ref 1.9–3.7)
Indirect Bilirubin: 0.4 mg/dL (calc) (ref 0.2–1.2)
Total Bilirubin: 0.5 mg/dL (ref 0.2–1.2)
Total Protein: 6.7 g/dL (ref 6.1–8.1)

## 2017-06-19 ENCOUNTER — Encounter: Payer: Self-pay | Admitting: Neurology

## 2017-06-19 ENCOUNTER — Ambulatory Visit (INDEPENDENT_AMBULATORY_CARE_PROVIDER_SITE_OTHER): Payer: PPO | Admitting: Neurology

## 2017-06-19 VITALS — BP 98/64 | HR 50 | Ht 66.0 in | Wt 120.6 lb

## 2017-06-19 DIAGNOSIS — G35 Multiple sclerosis: Secondary | ICD-10-CM

## 2017-06-19 NOTE — Progress Notes (Signed)
NEUROLOGY FOLLOW UP OFFICE NOTE  Morgan Herrera [id number]  HISTORY OF PRESENT ILLNESS: Morgan Herrera is a 66 year old right-handed female with multiple sclerosis, PMR and migraine who follows up for multiple sclerosis.   UPDATE: She is taking Aubagio, B12 1044mcg daily and D3 2000 IU daily. 04/04/17:  Vitamin D level 51.24.   06/14/17:  hepatic panel with total bili 0.5, ALP 52, AST 22 and ALT 19. She is doing well.  She denies significant fatigue and bladder dysfunction.  She is participating in yoga.   HISTORY: She was diagnosed with MS at age 38, when she presented with bilateral optic neuritis, right worse than left.  She was treated with IV steroids at that time..  In hindsight, she recalls brief episodes from her past.  In her late-20s, she had a 3 week episode of dizziness and ataxia.  She had brief episodes of limb paresthesias lasting a couple of days.   Work up included MRIs.  MRIs of the brain have shown lesions suspicious for MS.  Prior cervical MRI reportedly normal.   She never had a relapse and therefore never required further IV steroids.  However, she was diagnosed with polymyalgia rheumatica several years ago, after experiencing severe joint pain and stiffness.  She had been on prednisone for five years and was tapered off a few months ago.   She occasionally has fatigue once in a while, sometimes lasting for 2 weeks.  She denies problems with gait or balance.  She does not exhibit Lhermitte's sign.  She reports burning and aching on the bottom of her feet when she walks for prolonged period.   She works part-time as a Radiation protection practitioner.  For the past couple of years, she reports word-finding difficulty.  Sometimes it is difficult to concentrate.  It has almost affected her work at times.  She has a BA in [place].  She underwent neuropsychological testing in July 2017, which revealed anxiety and depression but no cognitive impairment.   Prior disease modifying drugs:  Avonex  (tired of injections), Tecfidera (nausea, flushing and unable to take aspirin)   MRI of brain with and without contrast from 09/06/15 showed chronic non-enhancing T2/FLAIR hyperintensities in the periventricular white matter, some demonstrating Dawson's fingers, with mild corpus callosum involvement, which appears stable compared to 2012 and similar to imaging from 2005.  MRI of brain with and without contrast from 10/09/16 was stable.  PAST MEDICAL HISTORY: Past Medical History:  Diagnosis Date  . Migraine   . Multiple sclerosis ([place])   . Neuromuscular disorder ([place])   . PMR (polymyalgia rheumatica) ([facility])     MEDICATIONS: Current Outpatient Prescriptions on File Prior to Visit  Medication Sig Dispense Refill  . BIOTIN PO Take 1 tablet by mouth daily.    . cholecalciferol (VITAMIN D) 1000 units tablet Take 2,000 Units by mouth daily.    Marland Kitchen escitalopram (LEXAPRO) 5 MG tablet TAKE 1 TABLET (5 MG TOTAL) BY MOUTH DAILY. 90 tablet 1  . furosemide (LASIX) 20 MG tablet TAKE 1 TABLET (20 MG TOTAL) BY MOUTH DAILY. 90 tablet 1  . Teriflunomide (AUBAGIO) 14 MG TABS Take 1 tablet by mouth daily. 30 tablet 11  . zolpidem (AMBIEN) 10 MG tablet TAKE 1/2 TO 1 TABLET AT BEDTIME AS NEEDED FOR SLEEP 90 tablet 0   No current facility-administered medications on file prior to visit.     ALLERGIES: No Known Allergies  FAMILY HISTORY: Family History  Problem Relation Age of Onset  .  Heart disease Mother   . Hypertension Mother   . Parkinson's disease Mother   . Parkinsonism Mother   . Cancer Maternal Grandmother        breast  . Asthma Father     SOCIAL HISTORY: Social History   Social History  . Marital status: Married    Spouse name: N/A  . Number of children: N/A  . Years of education: N/A   Occupational History  . Not on file.   Social History Main Topics  . Smoking status: Former Research scientist (life sciences)  . Smokeless tobacco: Never Used  . Alcohol use 0.0 oz/week     Comment: 1 to 2 glass of wine at  least 4 nights a week  . Drug use: No  . Sexual activity: Not on file   Other Topics Concern  . Not on file   Social History Narrative   Married.   Moved here from [place] after 35 years   Enjoys arts and crafts. Taking a painting class.   Walking with her spouse.    Reading, shopping.    REVIEW OF SYSTEMS: Constitutional: No fevers, chills, or sweats, no generalized fatigue, change in appetite Eyes: No visual changes, double vision, eye pain Ear, nose and throat: No hearing loss, ear pain, nasal congestion, sore throat Cardiovascular: No chest pain, palpitations Respiratory:  No shortness of breath at rest or with exertion, wheezes GastrointestinaI: No nausea, vomiting, diarrhea, abdominal pain, fecal incontinence Genitourinary:  No dysuria, urinary retention or frequency Musculoskeletal:  No neck pain, back pain Integumentary: No rash, pruritus, skin lesions Neurological: as above Psychiatric: No depression, insomnia, anxiety Endocrine: No palpitations, fatigue, diaphoresis, mood swings, change in appetite, change in weight, increased thirst Hematologic/Lymphatic:  No purpura, petechiae. Allergic/Immunologic: no itchy/runny eyes, nasal congestion, recent allergic reactions, rashes  PHYSICAL EXAM: Vitals:   06/19/17 0846  BP: 98/64  Pulse: (!) 50  SpO2: 97%   General: No acute distress.  Patient appears well-groomed.  normal body habitus. Head:  Normocephalic/atraumatic Eyes:  Fundi examined but not visualized Neck: supple, no paraspinal tenderness, full range of motion Heart:  Regular rate and rhythm Lungs:  Clear to auscultation bilaterally Back: No paraspinal tenderness Neurological Exam: alert and oriented to person, place, and time. Attention span and concentration intact, recent and remote memory intact, fund of knowledge intact.  Speech fluent and not dysarthric, language intact.  Right sided peripheral vision loss.  Otherwise, CN II-XII intact. Bulk and tone  normal, muscle strength 5-/5 deltoids (secondary to chronic pain), otherwise 5/5 throughout.  Sensation to temperature and vibration intact.  Deep tendon reflexes 2+ in upper extremities, 3+ lower extremities, toes downgoing.  Finger to nose and heel to shin testing intact.  Gait normal, Timed 25 foot walk 4.96 seconds.  Romberg negative.  IMPRESSION: Multiple sclerosis, stable  PLAN: Continue Aubagio, D3 2000 IU daily and B12 1064mcg daily Continue yoga Repeat MRI of brain with and without contrast and hepatic panel in 6 months Follow up in 6 months soon after repeat testing.  25 minutes spent face to face with patient, over 50% spent discussing management.  Metta Clines, DO  CC:  Alma Friendly, NP

## 2017-06-19 NOTE — Patient Instructions (Signed)
1.  Continue Aubagio, D3 2000 IU daily and B12 1050mcg daily 2.  Continue yoga 3.  Repeat MRI of brain with and without contrast and hepatic panel in 6 months with follow up soon afterward.

## 2017-08-29 ENCOUNTER — Other Ambulatory Visit: Payer: Self-pay | Admitting: Primary Care

## 2017-08-29 DIAGNOSIS — R609 Edema, unspecified: Secondary | ICD-10-CM

## 2017-09-04 ENCOUNTER — Ambulatory Visit: Payer: PPO | Admitting: Primary Care

## 2017-09-06 ENCOUNTER — Ambulatory Visit (INDEPENDENT_AMBULATORY_CARE_PROVIDER_SITE_OTHER): Payer: PPO | Admitting: Primary Care

## 2017-09-06 ENCOUNTER — Encounter: Payer: Self-pay | Admitting: Primary Care

## 2017-09-06 VITALS — BP 118/74 | HR 72 | Temp 97.7°F | Ht 66.0 in | Wt 120.0 lb

## 2017-09-06 DIAGNOSIS — D229 Melanocytic nevi, unspecified: Secondary | ICD-10-CM | POA: Diagnosis not present

## 2017-09-06 NOTE — Progress Notes (Signed)
Subjective:    Patient ID: Morgan Herrera, female    DOB: 12/04/1950, 66 y.o.   MRN: [id number]  HPI  Morgan Herrera is a 66 year old female who presents today for mole check. She first noticed the mole 8 years ago. At that time the mole was flat and small, she thought it represented a birth mark. Over the last several years she's noticed a gradual increase in size. Over the last one year she's noticed it becoming raised, darker brown color, and a crusty texture. She's been squeezing the mole and has noticed a milky liquid, she's not squeezed this in the last 2 months. She denies itching and pain. She would like the mole removed.   Review of Systems  Constitutional: Negative for fever.  Skin:       Nevus        Past Medical History:  Diagnosis Date  . Migraine   . Multiple sclerosis ([place])   . Neuromuscular disorder ([place])   . PMR (polymyalgia rheumatica) ([facility])      Social History   Socioeconomic History  . Marital status: Married    Spouse name: Not on file  . Number of children: Not on file  . Years of education: Not on file  . Highest education level: Not on file  Social Needs  . Financial resource strain: Not on file  . Food insecurity - worry: Not on file  . Food insecurity - inability: Not on file  . Transportation needs - medical: Not on file  . Transportation needs - non-medical: Not on file  Occupational History  . Not on file  Tobacco Use  . Smoking status: Former Research scientist (life sciences)  . Smokeless tobacco: Never Used  Substance and Sexual Activity  . Alcohol use: Yes    Alcohol/week: 0.0 oz    Comment: 1 to 2 glass of wine at least 4 nights a week  . Drug use: No  . Sexual activity: Not on file  Other Topics Concern  . Not on file  Social History Narrative   Married.   Moved here from [place] after 35 years   Enjoys arts and crafts. Taking a painting class.   Walking with her spouse.    Reading, shopping.    Past Surgical History:  Procedure Laterality Date    . BREAST SURGERY  1990  . COLONOSCOPY WITH PROPOFOL N/A 05/19/2015   Procedure: COLONOSCOPY WITH PROPOFOL;  Surgeon: Hulen Luster, MD;  Location: [facility] ENDOSCOPY;  Service: Gastroenterology;  Laterality: N/A;  . EYE SURGERY     1978 1st of 3 eye surgeries  . TENDON REPAIR      Family History  Problem Relation Age of Onset  . Heart disease Mother   . Hypertension Mother   . Parkinson's disease Mother   . Parkinsonism Mother   . Cancer Maternal Grandmother        breast  . Asthma Father     No Known Allergies  Current Outpatient Medications on File Prior to Visit  Medication Sig Dispense Refill  . BIOTIN PO Take 1 tablet by mouth daily.    . cholecalciferol (VITAMIN D) 1000 units tablet Take 2,000 Units by mouth daily.    . cyanocobalamin 500 MCG tablet Take 500 mcg by mouth daily.    Marland Kitchen escitalopram (LEXAPRO) 5 MG tablet TAKE 1 TABLET (5 MG TOTAL) BY MOUTH DAILY. 90 tablet 1  . furosemide (LASIX) 20 MG tablet TAKE 1 TABLET BY MOUTH EVERY DAY 90  tablet 1  . Teriflunomide (AUBAGIO) 14 MG TABS Take 1 tablet by mouth daily. 30 tablet 11  . zolpidem (AMBIEN) 10 MG tablet TAKE 1/2 TO 1 TABLET AT BEDTIME AS NEEDED FOR SLEEP 90 tablet 0   No current facility-administered medications on file prior to visit.     BP 118/74   Pulse 72   Temp 97.7 F (36.5 C) (Oral)   Ht 5\' 6"  (1.676 m)   Wt 120 lb (54.4 kg)   SpO2 99%   BMI 19.37 kg/m    Objective:   Physical Exam  Constitutional: She appears well-nourished.  Pulmonary/Chest:    Skin: Skin is warm and dry.  1 cm oval shaped nevus to left lateral breast. Crusting to surface. Raised. Dark brown in color.          Assessment & Plan:  Nevus:  Located to left lateral breast x 8 years. Exam today with odd appearing texture. Edges round. Color consistent. Given questionable appearance and irritability of location, will remove. She will schedule an appointment for removal in the near future.  Sheral Flow, NP

## 2017-09-06 NOTE — Patient Instructions (Signed)
Schedule a 45 minute visit for the mole removal. Return at your convenience.  It was a pleasure to see you today!

## 2017-09-11 ENCOUNTER — Other Ambulatory Visit: Payer: Self-pay | Admitting: Neurology

## 2017-09-11 ENCOUNTER — Telehealth: Payer: Self-pay

## 2017-09-11 ENCOUNTER — Other Ambulatory Visit: Payer: Self-pay | Admitting: Primary Care

## 2017-09-11 NOTE — Telephone Encounter (Signed)
Rcvd call from [facility], spoke with  Goehner. She was clarifying refills om Aubagio Rx I sent in today. I indicated no further refills after this, until Pt makes appt. Apparently I neglected to clear the refill field. Advsd Chistia only one refill until appt is made.

## 2017-09-26 ENCOUNTER — Encounter: Payer: Self-pay | Admitting: Primary Care

## 2017-09-26 ENCOUNTER — Ambulatory Visit: Payer: PPO | Admitting: Primary Care

## 2017-09-26 VITALS — BP 110/74 | HR 52 | Temp 98.0°F | Ht 66.0 in | Wt 120.0 lb

## 2017-09-26 DIAGNOSIS — D229 Melanocytic nevi, unspecified: Secondary | ICD-10-CM

## 2017-09-26 DIAGNOSIS — L821 Other seborrheic keratosis: Secondary | ICD-10-CM | POA: Diagnosis not present

## 2017-09-26 NOTE — Progress Notes (Signed)
Subjective:    Patient ID: Morgan Herrera, female    DOB: 1950/12/07, 66 y.o.   MRN: [id number]  HPI  Morgan Herrera is a 66 year old female who presents today for nevus removal. The nevus is located to the left lateral breast that has been present for years with gradual growth over the last one year. She has also noticed a crusting/scaly appearance to the top center.   Review of Systems  Constitutional: Negative for fever.  Skin: Negative for wound.       Nevus to left lateral breast       Past Medical History:  Diagnosis Date  . Migraine   . Multiple sclerosis ([place])   . Neuromuscular disorder ([place])   . PMR (polymyalgia rheumatica) ([facility])      Social History   Socioeconomic History  . Marital status: Married    Spouse name: Not on file  . Number of children: Not on file  . Years of education: Not on file  . Highest education level: Not on file  Social Needs  . Financial resource strain: Not on file  . Food insecurity - worry: Not on file  . Food insecurity - inability: Not on file  . Transportation needs - medical: Not on file  . Transportation needs - non-medical: Not on file  Occupational History  . Not on file  Tobacco Use  . Smoking status: Former Research scientist (life sciences)  . Smokeless tobacco: Never Used  Substance and Sexual Activity  . Alcohol use: Yes    Alcohol/week: 0.0 oz    Comment: 1 to 2 glass of wine at least 4 nights a week  . Drug use: No  . Sexual activity: Not on file  Other Topics Concern  . Not on file  Social History Narrative   Married.   Moved here from [place] after 35 years   Enjoys arts and crafts. Taking a painting class.   Walking with her spouse.    Reading, shopping.    Past Surgical History:  Procedure Laterality Date  . BREAST SURGERY  1990  . COLONOSCOPY WITH PROPOFOL N/A 05/19/2015   Procedure: COLONOSCOPY WITH PROPOFOL;  Surgeon: Hulen Luster, MD;  Location: [facility] ENDOSCOPY;  Service: Gastroenterology;  Laterality: N/A;  . EYE SURGERY      1978 1st of 3 eye surgeries  . TENDON REPAIR      Family History  Problem Relation Age of Onset  . Heart disease Mother   . Hypertension Mother   . Parkinson's disease Mother   . Parkinsonism Mother   . Cancer Maternal Grandmother        breast  . Asthma Father     No Known Allergies  Current Outpatient Medications on File Prior to Visit  Medication Sig Dispense Refill  . AUBAGIO 14 MG TABS Take 1 tablet by mouth once per day 28 tablet 11  . BIOTIN PO Take 1 tablet by mouth daily.    . cholecalciferol (VITAMIN D) 1000 units tablet Take 2,000 Units by mouth daily.    . cyanocobalamin 500 MCG tablet Take 500 mcg by mouth daily.    Marland Kitchen escitalopram (LEXAPRO) 5 MG tablet TAKE 1 TABLET BY MOUTH EVERY DAY 90 tablet 1  . furosemide (LASIX) 20 MG tablet TAKE 1 TABLET BY MOUTH EVERY DAY 90 tablet 1  . zolpidem (AMBIEN) 10 MG tablet TAKE 1/2 TO 1 TABLET AT BEDTIME AS NEEDED FOR SLEEP 90 tablet 0   No current facility-administered medications on  file prior to visit.     BP 110/74   Pulse (!) 52   Temp 98 F (36.7 C) (Oral)   Ht 5\' 6"  (1.676 m)   Wt 120 lb (54.4 kg)   SpO2 97%   BMI 19.37 kg/m    Objective:   Physical Exam  Cardiovascular: Normal rate.  Pulmonary/Chest: Effort normal.    Skin: Skin is warm and dry.  1 cm oval shaped lesion to left lateral breast. Dark brown in color. Mild scaly appearance to center.          Assessment & Plan:  Nevus Removal:  Consent signed and witnessed.  Site cleansed with betadine solution. Lidocaine 1% with epi used for analgesia.  Nevus removed and sent off for pathology testing. Cautery used to control bleeding. Site cleansed and dressed. Patient tolerated well.  Sheral Flow, NP

## 2017-09-26 NOTE — Patient Instructions (Signed)
Keep the wound covered as discussed.  Please call me if you develop redness, drainage, pain around the site.  I'll be in touch once I receive the results.  It was a pleasure to see you today!

## 2017-09-30 ENCOUNTER — Ambulatory Visit
Admission: RE | Admit: 2017-09-30 | Discharge: 2017-09-30 | Disposition: A | Discharge status: Home / Self Care | Payer: PPO | Source: Ambulatory Visit | Attending: Primary Care | Admitting: Primary Care

## 2017-09-30 DIAGNOSIS — Z1239 Encounter for other screening for malignant neoplasm of breast: Secondary | ICD-10-CM

## 2017-09-30 DIAGNOSIS — Z1231 Encounter for screening mammogram for malignant neoplasm of breast: Secondary | ICD-10-CM | POA: Diagnosis not present

## 2017-10-18 ENCOUNTER — Ambulatory Visit (INDEPENDENT_AMBULATORY_CARE_PROVIDER_SITE_OTHER): Payer: PPO | Admitting: Primary Care

## 2017-10-18 ENCOUNTER — Encounter: Payer: Self-pay | Admitting: Primary Care

## 2017-10-18 VITALS — BP 116/70 | HR 55 | Temp 97.7°F | Ht 66.0 in | Wt 121.0 lb

## 2017-10-18 DIAGNOSIS — J019 Acute sinusitis, unspecified: Secondary | ICD-10-CM | POA: Diagnosis not present

## 2017-10-18 MED ORDER — AMOXICILLIN-POT CLAVULANATE 875-125 MG PO TABS: 1.0000 | ORAL_TABLET | Freq: Two times a day (BID) | ORAL | 0 refills | Status: DC

## 2017-10-18 NOTE — Progress Notes (Signed)
Subjective:    Patient ID: Morgan Herrera, female    DOB: Nov 12, 1950, 67 y.o.   MRN: [id number]  HPI  Morgan Herrera is a 67 year old female who presents today with a chief complaint of sinus pressure. She also reports shortness of breath, cough, nasal congestion, wheezing, chest congestion.   Her symptoms began 2 weeks ago. She denies fevers, sore throat. She's taken Alka-Selzer Cold with temporary improvement. She's blowing thick green mucous from her nasal cavity. Her husband has the same symptoms, he's improving whereas she is not.   Review of Systems  Constitutional: Positive for fatigue. Negative for fever.  HENT: Positive for congestion, sinus pressure and sinus pain.   Respiratory: Positive for cough, shortness of breath and wheezing.   Cardiovascular: Negative for chest pain.       Past Medical History:  Diagnosis Date  . Migraine   . Multiple sclerosis ([place])   . Neuromuscular disorder ([place])   . PMR (polymyalgia rheumatica) (HCC)      Social History   Socioeconomic History  . Marital status: Married    Spouse name: Not on file  . Number of children: Not on file  . Years of education: Not on file  . Highest education level: Not on file  Social Needs  . Financial resource strain: Not on file  . Food insecurity - worry: Not on file  . Food insecurity - inability: Not on file  . Transportation needs - medical: Not on file  . Transportation needs - non-medical: Not on file  Occupational History  . Not on file  Tobacco Use  . Smoking status: Former Research scientist (life sciences)  . Smokeless tobacco: Never Used  Substance and Sexual Activity  . Alcohol use: Yes    Alcohol/week: 0.0 oz    Comment: 1 to 2 glass of wine at least 4 nights a week  . Drug use: No  . Sexual activity: Not on file  Other Topics Concern  . Not on file  Social History Narrative   Married.   Moved here from [place] after 35 years   Enjoys arts and crafts. Taking a painting class.   Walking with her  spouse.    Reading, shopping.    Past Surgical History:  Procedure Laterality Date  . BREAST EXCISIONAL BIOPSY    . BREAST SURGERY  1990  . COLONOSCOPY WITH PROPOFOL N/A 05/19/2015   Procedure: COLONOSCOPY WITH PROPOFOL;  Surgeon: Hulen Luster, MD;  Location: [facility] ENDOSCOPY;  Service: Gastroenterology;  Laterality: N/A;  . EYE SURGERY     1978 1st of 3 eye surgeries  . TENDON REPAIR      Family History  Problem Relation Age of Onset  . Heart disease Mother   . Hypertension Mother   . Parkinson's disease Mother   . Parkinsonism Mother   . Cancer Maternal Grandmother        breast  . Breast cancer Maternal Grandmother   . Asthma Father     No Known Allergies  Current Outpatient Medications on File Prior to Visit  Medication Sig Dispense Refill  . AUBAGIO 14 MG TABS Take 1 tablet by mouth once per day 28 tablet 11  . BIOTIN PO Take 1 tablet by mouth daily.    . cholecalciferol (VITAMIN D) 1000 units tablet Take 2,000 Units by mouth daily.    . cyanocobalamin 500 MCG tablet Take 500 mcg by mouth daily.    Marland Kitchen escitalopram (LEXAPRO) 5 MG tablet TAKE 1 TABLET  BY MOUTH EVERY DAY 90 tablet 1  . furosemide (LASIX) 20 MG tablet TAKE 1 TABLET BY MOUTH EVERY DAY 90 tablet 1  . zolpidem (AMBIEN) 10 MG tablet TAKE 1/2 TO 1 TABLET AT BEDTIME AS NEEDED FOR SLEEP 90 tablet 0   No current facility-administered medications on file prior to visit.     BP 116/70   Pulse (!) 55   Temp 97.7 F (36.5 C) (Oral)   Ht 5\' 6"  (1.676 m)   Wt 121 lb (54.9 kg)   SpO2 99%   BMI 19.53 kg/m    Objective:   Physical Exam  Constitutional: She appears well-nourished. She appears ill.  HENT:  Right Ear: Tympanic membrane and ear canal normal.  Left Ear: Tympanic membrane and ear canal normal.  Nose: Mucosal edema present. Right sinus exhibits maxillary sinus tenderness and frontal sinus tenderness. Left sinus exhibits maxillary sinus tenderness and frontal sinus tenderness.  Mouth/Throat: Oropharynx  is clear and moist.  Eyes: Conjunctivae are normal.  Neck: Neck supple.  Cardiovascular: Normal rate and regular rhythm.  Pulmonary/Chest: Effort normal and breath sounds normal. She has no wheezes. She has no rales.  Persistent congested cough during exam  Lymphadenopathy:    She has no cervical adenopathy.  Skin: Skin is warm and dry.          Assessment & Plan:  Acute Sinusitis:  Sinus pressure, cough, congestion x 2 weeks, little to no improvement on OTC treatment. Husband with same symptoms, is improving. Exam consistent for acute bacterial sinusitis. Rx for Augmentin course sent to pharmacy. Discussed fluids, rest, Flonase PRN.  Sheral Flow, NP

## 2017-10-18 NOTE — Patient Instructions (Signed)
Start Augmentin antibiotics for the infection Take 1 tablet by mouth twice daily for 10 days.  Nasal Congestion/Ear Pressure: Try using Flonase (fluticasone) nasal spray. Instill 1 spray in each nostril twice daily.   You can try Delsym DM or Robitussin DM for cough and congestion.  Ensure you are staying hydrated with water.  It was a pleasure to see you today!

## 2017-10-31 ENCOUNTER — Other Ambulatory Visit: Payer: Self-pay | Admitting: Primary Care

## 2017-10-31 DIAGNOSIS — G4700 Insomnia, unspecified: Secondary | ICD-10-CM

## 2017-11-01 MED ORDER — ZOLPIDEM TARTRATE 10 MG PO TABS
5.0000 mg | ORAL_TABLET | Freq: Every evening | ORAL | 0 refills | Status: DC | PRN
Start: 2017-11-01 — End: 2018-03-21

## 2017-11-01 NOTE — Telephone Encounter (Signed)
Ok to refill? Electronically refill request for zolpidem (AMBIEN) 10 MG tablet  Last prescribed on 04/15/2017. Last seen on 10/18/2017

## 2017-11-01 NOTE — Telephone Encounter (Signed)
Refill sent to pharmacy.   

## 2017-11-06 ENCOUNTER — Other Ambulatory Visit: Payer: Self-pay

## 2017-11-06 ENCOUNTER — Other Ambulatory Visit: Payer: Self-pay | Admitting: Neurology

## 2017-11-06 DIAGNOSIS — G35 Multiple sclerosis: Secondary | ICD-10-CM

## 2017-11-21 ENCOUNTER — Ambulatory Visit (INDEPENDENT_AMBULATORY_CARE_PROVIDER_SITE_OTHER): Payer: PPO | Admitting: Primary Care

## 2017-11-21 ENCOUNTER — Encounter: Payer: Self-pay | Admitting: Primary Care

## 2017-11-21 VITALS — BP 118/74 | HR 76 | Temp 98.8°F | Ht 66.0 in | Wt 120.5 lb

## 2017-11-21 DIAGNOSIS — J101 Influenza due to other identified influenza virus with other respiratory manifestations: Secondary | ICD-10-CM

## 2017-11-21 DIAGNOSIS — R6889 Other general symptoms and signs: Secondary | ICD-10-CM

## 2017-11-21 LAB — POC INFLUENZA A&B (BINAX/QUICKVUE)
INFLUENZA B, POC: NEGATIVE
Influenza A, POC: POSITIVE — AB

## 2017-11-21 MED ORDER — OSELTAMIVIR PHOSPHATE 75 MG PO CAPS
75.0000 mg | ORAL_CAPSULE | Freq: Two times a day (BID) | ORAL | 0 refills | Status: DC
Start: 2017-11-21 — End: 2017-12-31

## 2017-11-21 NOTE — Addendum Note (Signed)
Addended by: Jacqualin Combes on: 11/21/2017 08:13 AM   Modules accepted: Orders

## 2017-11-21 NOTE — Patient Instructions (Signed)
You have the flu.  Start Tamiflu capsules. Take 1 capsule by mouth twice daily for 7 days.  Make sure you stay hydrated with water and rest.   It was a pleasure to see you today!

## 2017-11-21 NOTE — Progress Notes (Signed)
Subjective:    Patient ID: Morgan Herrera, female    DOB: 1951/06/09, 67 y.o.   MRN: [id number]  HPI  Ms. Willenbring is a 67 year old female with a history of multiple sclerosis who presents today with a chief complaint of cough. She also reports sore throat, shortness of breath, dizziness, sneezing. Symptoms began three days ago with a scratchy throat. She denies fevers. She was in [place] over the weekend last week, was traveling on a plane. Her cough is non productive. She's taken Nyquil and Tylenol with some improvement.   Review of Systems  Constitutional: Positive for fatigue. Negative for fever.  HENT: Positive for congestion and sore throat. Negative for sinus pressure.   Respiratory: Positive for cough and shortness of breath.        Past Medical History:  Diagnosis Date  . Migraine   . Multiple sclerosis ([place])   . Neuromuscular disorder ([place])   . PMR (polymyalgia rheumatica) ([facility])      Social History   Socioeconomic History  . Marital status: Married    Spouse name: Not on file  . Number of children: Not on file  . Years of education: Not on file  . Highest education level: Not on file  Social Needs  . Financial resource strain: Not on file  . Food insecurity - worry: Not on file  . Food insecurity - inability: Not on file  . Transportation needs - medical: Not on file  . Transportation needs - non-medical: Not on file  Occupational History  . Not on file  Tobacco Use  . Smoking status: Former Research scientist (life sciences)  . Smokeless tobacco: Never Used  Substance and Sexual Activity  . Alcohol use: Yes    Alcohol/week: 0.0 oz    Comment: 1 to 2 glass of wine at least 4 nights a week  . Drug use: No  . Sexual activity: Not on file  Other Topics Concern  . Not on file  Social History Narrative   Married.   Moved here from [place] after 35 years   Enjoys arts and crafts. Taking a painting class.   Walking with her spouse.    Reading, shopping.    Past Surgical  History:  Procedure Laterality Date  . BREAST EXCISIONAL BIOPSY    . BREAST SURGERY  1990  . COLONOSCOPY WITH PROPOFOL N/A 05/19/2015   Procedure: COLONOSCOPY WITH PROPOFOL;  Surgeon: Hulen Luster, MD;  Location: [facility] ENDOSCOPY;  Service: Gastroenterology;  Laterality: N/A;  . EYE SURGERY     1978 1st of 3 eye surgeries  . TENDON REPAIR      Family History  Problem Relation Age of Onset  . Heart disease Mother   . Hypertension Mother   . Parkinson's disease Mother   . Parkinsonism Mother   . Cancer Maternal Grandmother        breast  . Breast cancer Maternal Grandmother   . Asthma Father     No Known Allergies  Current Outpatient Medications on File Prior to Visit  Medication Sig Dispense Refill  . AUBAGIO 14 MG TABS Take 1 tablet by mouth once per day 28 tablet 11  . BIOTIN PO Take 1 tablet by mouth daily.    . cholecalciferol (VITAMIN D) 1000 units tablet Take 2,000 Units by mouth daily.    . cyanocobalamin 500 MCG tablet Take 500 mcg by mouth daily.    Marland Kitchen escitalopram (LEXAPRO) 5 MG tablet TAKE 1 TABLET BY MOUTH EVERY DAY  90 tablet 1  . furosemide (LASIX) 20 MG tablet TAKE 1 TABLET BY MOUTH EVERY DAY 90 tablet 1  . zolpidem (AMBIEN) 10 MG tablet Take 0.5-1 tablets (5-10 mg total) by mouth at bedtime as needed for sleep. 90 tablet 0   No current facility-administered medications on file prior to visit.     BP 118/74   Pulse 76   Temp 98.8 F (37.1 C) (Oral)   Ht 5\' 6"  (1.676 m)   Wt 120 lb 8 oz (54.7 kg)   SpO2 94%   BMI 19.45 kg/m    Objective:   Physical Exam  Constitutional: She appears well-nourished. She appears ill.  HENT:  Right Ear: Tympanic membrane and ear canal normal.  Left Ear: Tympanic membrane and ear canal normal.  Nose: Mucosal edema present. Right sinus exhibits no maxillary sinus tenderness and no frontal sinus tenderness. Left sinus exhibits no maxillary sinus tenderness and no frontal sinus tenderness.  Mouth/Throat: Oropharynx is clear and  moist.  Eyes: Conjunctivae are normal.  Neck: Neck supple.  Cardiovascular: Normal rate and regular rhythm.  Pulmonary/Chest: Effort normal and breath sounds normal. She has no wheezes. She has no rales.  Lymphadenopathy:    She has no cervical adenopathy.  Skin: Skin is warm and dry.          Assessment & Plan:  Influenza:  Cough, dizziness, fatigue x 2 days.  Some improvement with OTC treatment, on a plane last weekend. Influenza positive for A. Rx for Tamiflu course sent to pharmacy. Will also treat husband with prophylaxis dose.  Fluids, rest, follow up PRN.  Pleas Koch, NP

## 2017-11-27 ENCOUNTER — Encounter: Payer: Self-pay | Admitting: Primary Care

## 2017-11-27 DIAGNOSIS — J101 Influenza due to other identified influenza virus with other respiratory manifestations: Secondary | ICD-10-CM

## 2017-11-28 MED ORDER — BENZONATATE 200 MG PO CAPS: 200.0000 mg | ORAL_CAPSULE | Freq: Three times a day (TID) | ORAL | 0 refills | Status: DC | PRN

## 2017-12-24 ENCOUNTER — Telehealth: Payer: Self-pay

## 2017-12-24 DIAGNOSIS — G35 Multiple sclerosis: Secondary | ICD-10-CM

## 2017-12-24 NOTE — Telephone Encounter (Signed)
Called and spoke with Pt, advsd ehr Dr Tomi Likens wanted MRI in addition to labs that are scheduled. Put in order, gave Pt [facility] ph# to call if has not heard from them in 24 hrs

## 2017-12-26 ENCOUNTER — Other Ambulatory Visit (INDEPENDENT_AMBULATORY_CARE_PROVIDER_SITE_OTHER): Payer: PPO

## 2017-12-26 DIAGNOSIS — G35 Multiple sclerosis: Secondary | ICD-10-CM

## 2017-12-27 LAB — HEPATIC FUNCTION PANEL
AG Ratio: 2 (calc) (ref 1.0–2.5)
ALKALINE PHOSPHATASE (APISO): 52 U/L (ref 33–130)
ALT: 26 U/L (ref 6–29)
AST: 28 U/L (ref 10–35)
Albumin: 4.5 g/dL (ref 3.6–5.1)
BILIRUBIN INDIRECT: 0.4 mg/dL (ref 0.2–1.2)
BILIRUBIN TOTAL: 0.5 mg/dL (ref 0.2–1.2)
Bilirubin, Direct: 0.1 mg/dL (ref 0.0–0.2)
Globulin: 2.3 g/dL (calc) (ref 1.9–3.7)
Total Protein: 6.8 g/dL (ref 6.1–8.1)

## 2017-12-28 ENCOUNTER — Ambulatory Visit
Admission: RE | Admit: 2017-12-28 | Discharge: 2017-12-28 | Disposition: A | Discharge status: Home / Self Care | Payer: PPO | Source: Ambulatory Visit | Attending: Neurology | Admitting: Neurology

## 2017-12-28 DIAGNOSIS — G35 Multiple sclerosis: Secondary | ICD-10-CM | POA: Diagnosis not present

## 2017-12-28 MED ORDER — GADOBENATE DIMEGLUMINE 529 MG/ML IV SOLN
11.0000 mL | Freq: Once | INTRAVENOUS | Status: AC | PRN
  Administered 2017-12-28: 11 mL via INTRAVENOUS

## 2017-12-31 ENCOUNTER — Other Ambulatory Visit: Payer: PPO

## 2017-12-31 ENCOUNTER — Ambulatory Visit (INDEPENDENT_AMBULATORY_CARE_PROVIDER_SITE_OTHER): Payer: PPO | Admitting: Neurology

## 2017-12-31 ENCOUNTER — Encounter: Payer: Self-pay | Admitting: Neurology

## 2017-12-31 VITALS — BP 108/72 | HR 62 | Resp 16 | Ht 66.0 in | Wt 122.8 lb

## 2017-12-31 DIAGNOSIS — G35 Multiple sclerosis: Secondary | ICD-10-CM | POA: Diagnosis not present

## 2017-12-31 LAB — VITAMIN D 25 HYDROXY (VIT D DEFICIENCY, FRACTURES): VITD: 60.31 ng/mL (ref 30.00–100.00)

## 2017-12-31 NOTE — Progress Notes (Signed)
NEUROLOGY FOLLOW UP OFFICE NOTE  Morgan Herrera [id number]  HISTORY OF PRESENT ILLNESS: Morgan Herrera is a 67 year old right-handed female with multiple sclerosis, PMR and migraine who follows up for multiple sclerosis.   UPDATE: She is taking Aubagio, B12 1034mcg daily and D3 2000 IU daily. 12/26/17 Hepatic function panel:  t bili 0.5, ALP 52, AST 28 and ALT 26. 12/28/17 MRI of brain with and without contrast was personally reviewed and is stable compared to prior imaging from 2016.  She had the flu last month but she is doing well now. Vision:  okay Motor:  okay Sensory:  okay Pain:  stable Gait:  Overall stable.  On a couple of occasions, it felt that her right foot was caught. Bowel/bladder:  goodo Fatigue:  good Cognition:  Good.  No recent deficits. Mood:  Okay  HISTORY: She was diagnosed with MS at age 67, when she presented with bilateral optic neuritis, right worse than left.  She was treated with IV steroids at that time..  In hindsight, she recalls brief episodes from her past.  In her late-20s, she had a 3 week episode of dizziness and ataxia.  She had brief episodes of limb paresthesias lasting a couple of days.   Work up included MRIs.  MRIs of the brain have shown lesions suspicious for MS.  Prior cervical MRI reportedly normal.   She never had a relapse and therefore never required further IV steroids.  However, she was diagnosed with polymyalgia rheumatica several years ago, after experiencing severe joint pain and stiffness.     She occasionally has fatigue once in a while, sometimes lasting for 2 weeks.   She does not exhibit Lhermitte's sign.  She reports burning and aching on the bottom of her feet when she walks for prolonged period.   She works part-time as a Radiation protection practitioner.  For the past couple of years, she reports word-finding difficulty.  Sometimes it is difficult to concentrate.  It has almost affected her work at times.  She has a BA in [place].  She  underwent neuropsychological testing in July 2017, which revealed anxiety and depression but no cognitive impairment.   Prior disease modifying drugs:  Avonex (tired of injections), Tecfidera (nausea, flushing and unable to take aspirin)   MRI of brain with and without contrast from 09/06/15 showed chronic non-enhancing T2/FLAIR hyperintensities in the periventricular white matter, some demonstrating Dawson's fingers, with mild corpus callosum involvement, which appears stable compared to 2012 and similar to imaging from 2005.  MRI of brain with and without contrast from 10/09/16 was stable.  PAST MEDICAL HISTORY: Past Medical History:  Diagnosis Date  . Migraine   . Multiple sclerosis ([place])   . Neuromuscular disorder ([place])   . PMR (polymyalgia rheumatica) ([facility])     MEDICATIONS: Current Outpatient Medications on File Prior to Visit  Medication Sig Dispense Refill  . AUBAGIO 14 MG TABS Take 1 tablet by mouth once per day 28 tablet 11  . BIOTIN PO Take 1 tablet by mouth daily.    . cholecalciferol (VITAMIN D) 1000 units tablet Take 2,000 Units by mouth daily.    . cyanocobalamin 500 MCG tablet Take 500 mcg by mouth daily.    Marland Kitchen escitalopram (LEXAPRO) 5 MG tablet TAKE 1 TABLET BY MOUTH EVERY DAY 90 tablet 1  . furosemide (LASIX) 20 MG tablet TAKE 1 TABLET BY MOUTH EVERY DAY 90 tablet 1  . zolpidem (AMBIEN) 10 MG tablet Take 0.5-1 tablets (5-10 mg  total) by mouth at bedtime as needed for sleep. 90 tablet 0   No current facility-administered medications on file prior to visit.     ALLERGIES: No Known Allergies  FAMILY HISTORY: Family History  Problem Relation Age of Onset  . Heart disease Mother   . Hypertension Mother   . Parkinson's disease Mother   . Parkinsonism Mother   . Cancer Maternal Grandmother        breast  . Breast cancer Maternal Grandmother   . Asthma Father     SOCIAL HISTORY: Social History   Socioeconomic History  . Marital status: Married    Spouse name:  Not on file  . Number of children: Not on file  . Years of education: Not on file  . Highest education level: Not on file  Occupational History  . Not on file  Social Needs  . Financial resource strain: Not on file  . Food insecurity:    Worry: Not on file    Inability: Not on file  . Transportation needs:    Medical: Not on file    Non-medical: Not on file  Tobacco Use  . Smoking status: Former Research scientist (life sciences)  . Smokeless tobacco: Never Used  Substance and Sexual Activity  . Alcohol use: Yes    Alcohol/week: 0.0 oz    Comment: 1 to 2 glass of wine at least 4 nights a week  . Drug use: No  . Sexual activity: Not on file  Lifestyle  . Physical activity:    Days per week: Not on file    Minutes per session: Not on file  . Stress: Not on file  Relationships  . Social connections:    Talks on phone: Not on file    Gets together: Not on file    Attends religious service: Not on file    Active member of club or organization: Not on file    Attends meetings of clubs or organizations: Not on file    Relationship status: Not on file  . Intimate partner violence:    Fear of current or ex partner: Not on file    Emotionally abused: Not on file    Physically abused: Not on file    Forced sexual activity: Not on file  Other Topics Concern  . Not on file  Social History Narrative   Married.   Moved here from [place] after 35 years   Enjoys arts and crafts. Taking a painting class.   Walking with her spouse.    Reading, shopping.    REVIEW OF SYSTEMS: Constitutional: No fevers, chills, or sweats, no generalized fatigue, change in appetite Eyes: No visual changes, double vision, eye pain Ear, nose and throat: No hearing loss, ear pain, nasal congestion, sore throat Cardiovascular: No chest pain, palpitations Respiratory:  No shortness of breath at rest or with exertion, wheezes GastrointestinaI: No nausea, vomiting, diarrhea, abdominal pain, fecal incontinence Genitourinary:  No  dysuria, urinary retention or frequency Musculoskeletal:  No neck pain, back pain Integumentary: No rash, pruritus, skin lesions Neurological: as above Psychiatric: No depression, insomnia, anxiety Endocrine: No palpitations, fatigue, diaphoresis, mood swings, change in appetite, change in weight, increased thirst Hematologic/Lymphatic:  No purpura, petechiae. Allergic/Immunologic: no itchy/runny eyes, nasal congestion, recent allergic reactions, rashes  PHYSICAL EXAM: Vitals:   12/31/17 0859  BP: 108/72  Pulse: 62  Resp: 16  SpO2: 98%   General: No acute distress.  Patient appears well-groomed.  normal body habitus. Head:  Normocephalic/atraumatic Eyes:  Fundi  examined but not visualized Neck: supple, no paraspinal tenderness, full range of motion Heart:  Regular rate and rhythm Lungs:  Clear to auscultation bilaterally Back: No paraspinal tenderness Neurological Exam: alert and oriented to person, place, and time. Attention span and concentration intact, recent and remote memory intact, fund of knowledge intact.  Speech fluent and not dysarthric, language intact.  Right sided peripheral vision loss.  Otherwise, CN II-XII intact. Bulk and tone normal, muscle strength 5-/5 deltoids (secondary to chronic pain), otherwise 5/5 throughout.  Sensation to temperature and vibration intact.  Deep tendon reflexes 2+ in upper extremities, 3+ lower extremities, toes downgoing.  Finger to nose and heel to shin testing intact.  Gait normal, Timed 25 foot walk 4.55 seconds.  Romberg negative.  IMPRESSION: Multiple sclerosis, stable  PLAN: 1.  Continue Aubagio, D3 2000 IU daily 2.  Check D level today 3.  Check CBC with diff, hepatic panel and D level in 6 months (prior to follow up) 4.  Follow up in 6 months.  25 minutes spent face to face with patient, over 50% spent discussing management.  Metta Clines, DO  CC:  Alma Friendly, NP

## 2017-12-31 NOTE — Patient Instructions (Addendum)
1.  Continue Aubagio, D3 2000 IU daily 2.  Check D level today Your provider has requested that you have labwork completed today. Please go to [facility] ([id number]) on the second floor of this building before leaving the office today. You do not need to check in. If you are not called within 15 minutes please check with the front desk.  3.  Check CBC with diff, hepatic panel and D level in 6 months (prior to follow up) 4.  Follow up in 6 months.

## 2018-01-24 ENCOUNTER — Encounter: Payer: Self-pay | Admitting: Neurology

## 2018-02-19 DIAGNOSIS — H524 Presbyopia: Secondary | ICD-10-CM | POA: Diagnosis not present

## 2018-02-19 DIAGNOSIS — H59811 Chorioretinal scars after surgery for detachment, right eye: Secondary | ICD-10-CM | POA: Diagnosis not present

## 2018-02-19 DIAGNOSIS — Z961 Presence of intraocular lens: Secondary | ICD-10-CM | POA: Diagnosis not present

## 2018-02-19 DIAGNOSIS — H52223 Regular astigmatism, bilateral: Secondary | ICD-10-CM | POA: Diagnosis not present

## 2018-02-19 DIAGNOSIS — H5212 Myopia, left eye: Secondary | ICD-10-CM | POA: Diagnosis not present

## 2018-02-19 DIAGNOSIS — H3540 Unspecified peripheral retinal degeneration: Secondary | ICD-10-CM | POA: Diagnosis not present

## 2018-02-19 DIAGNOSIS — H2512 Age-related nuclear cataract, left eye: Secondary | ICD-10-CM | POA: Diagnosis not present

## 2018-02-19 DIAGNOSIS — H5201 Hypermetropia, right eye: Secondary | ICD-10-CM | POA: Diagnosis not present

## 2018-02-27 ENCOUNTER — Other Ambulatory Visit: Payer: Self-pay | Admitting: Primary Care

## 2018-02-27 DIAGNOSIS — R609 Edema, unspecified: Secondary | ICD-10-CM

## 2018-03-12 ENCOUNTER — Ambulatory Visit: Payer: PPO | Admitting: Primary Care

## 2018-03-13 ENCOUNTER — Encounter: Payer: Self-pay | Admitting: Primary Care

## 2018-03-13 ENCOUNTER — Ambulatory Visit (INDEPENDENT_AMBULATORY_CARE_PROVIDER_SITE_OTHER)
Admission: RE | Admit: 2018-03-13 | Discharge: 2018-03-13 | Disposition: A | Discharge status: Home / Self Care | Payer: PPO | Source: Ambulatory Visit | Attending: Primary Care | Admitting: Primary Care

## 2018-03-13 ENCOUNTER — Ambulatory Visit (INDEPENDENT_AMBULATORY_CARE_PROVIDER_SITE_OTHER): Payer: PPO | Admitting: Primary Care

## 2018-03-13 VITALS — BP 114/70 | HR 74 | Temp 97.8°F | Ht 66.0 in | Wt 124.2 lb

## 2018-03-13 DIAGNOSIS — M79672 Pain in left foot: Secondary | ICD-10-CM | POA: Diagnosis not present

## 2018-03-13 NOTE — Patient Instructions (Signed)
Your symptoms are likely secondary to increased inflammation from your increased activity level.  Complete xray(s) prior to leaving today. I will notify you of your results once received.  Start Ibuprofen 400-600 mg thee times daily as needed for the next 5-7 days.   Rest your foot as discussed. Continue to wear extra insoles.  Try stretching your foot by rolling your bare foot with a soup/vegetable three times daily if possible.  Please update me if no improvement a few weeks.  It was a pleasure to see you today!

## 2018-03-13 NOTE — Progress Notes (Signed)
Subjective:    Patient ID: Morgan Herrera, female    DOB: October 23, 1950, 67 y.o.   MRN: [id number]  HPI  Morgan Herrera is a 67 year old female with a history of multiple sclerosis, vitamin D deficiency who presents today with a chief complaint of foot pain.  Her pain is located to the ball of her left foot that has been present for one month. She'll also experience pain to the base of her toes. She does experience occasional numbness/tingling but this is not new, she's had this intermittently for years. She's increased her activity level over the last month and hasbeen walking daily and hiking more often.   She's wearing insoles to her shoes without improvement in pain. She's not taken anything OTC for her symptoms.   She denies recent injury/trauma, radiation of pain to the other part of her foot, skin color changes.   Review of Systems  Musculoskeletal: Positive for arthralgias.  Skin: Negative for color change.       Past Medical History:  Diagnosis Date  . Migraine   . Multiple sclerosis ([place])   . Neuromuscular disorder ([place])   . PMR (polymyalgia rheumatica) (HCC)      Social History   Socioeconomic History  . Marital status: Married    Spouse name: Not on file  . Number of children: Not on file  . Years of education: Not on file  . Highest education level: Not on file  Occupational History  . Not on file  Social Needs  . Financial resource strain: Not on file  . Food insecurity:    Worry: Not on file    Inability: Not on file  . Transportation needs:    Medical: Not on file    Non-medical: Not on file  Tobacco Use  . Smoking status: Former Research scientist (life sciences)  . Smokeless tobacco: Never Used  Substance and Sexual Activity  . Alcohol use: Yes    Alcohol/week: 0.0 oz    Comment: 1 to 2 glass of wine at least 4 nights a week  . Drug use: No  . Sexual activity: Not on file  Lifestyle  . Physical activity:    Days per week: Not on file    Minutes per session: Not on  file  . Stress: Not on file  Relationships  . Social connections:    Talks on phone: Not on file    Gets together: Not on file    Attends religious service: Not on file    Active member of club or organization: Not on file    Attends meetings of clubs or organizations: Not on file    Relationship status: Not on file  . Intimate partner violence:    Fear of current or ex partner: Not on file    Emotionally abused: Not on file    Physically abused: Not on file    Forced sexual activity: Not on file  Other Topics Concern  . Not on file  Social History Narrative   Married.   Moved here from [place] after 35 years   Enjoys arts and crafts. Taking a painting class.   Walking with her spouse.    Reading, shopping.    Past Surgical History:  Procedure Laterality Date  . BREAST EXCISIONAL BIOPSY    . BREAST SURGERY  1990  . COLONOSCOPY WITH PROPOFOL N/A 05/19/2015   Procedure: COLONOSCOPY WITH PROPOFOL;  Surgeon: Hulen Luster, MD;  Location: [facility] ENDOSCOPY;  Service: Gastroenterology;  Laterality:  N/A;  . EYE SURGERY     1978 1st of 3 eye surgeries  . TENDON REPAIR      Family History  Problem Relation Age of Onset  . Heart disease Mother   . Hypertension Mother   . Parkinson's disease Mother   . Parkinsonism Mother   . Cancer Maternal Grandmother        breast  . Breast cancer Maternal Grandmother   . Asthma Father     No Known Allergies  Current Outpatient Medications on File Prior to Visit  Medication Sig Dispense Refill  . AUBAGIO 14 MG TABS Take 1 tablet by mouth once per day 28 tablet 11  . BIOTIN PO Take 1 tablet by mouth daily.    . cholecalciferol (VITAMIN D) 1000 units tablet Take 2,000 Units by mouth daily.    . cyanocobalamin 500 MCG tablet Take 500 mcg by mouth daily.    Marland Kitchen escitalopram (LEXAPRO) 5 MG tablet TAKE 1 TABLET BY MOUTH EVERY DAY 90 tablet 1  . furosemide (LASIX) 20 MG tablet TAKE 1 TABLET BY MOUTH EVERY DAY 90 tablet 0  . zolpidem (AMBIEN) 10 MG  tablet Take 0.5-1 tablets (5-10 mg total) by mouth at bedtime as needed for sleep. 90 tablet 0   No current facility-administered medications on file prior to visit.     BP 114/70   Pulse 74   Temp 97.8 F (36.6 C) (Oral)   Ht 5\' 6"  (1.676 m)   Wt 124 lb 4 oz (56.4 kg)   SpO2 97%   BMI 20.05 kg/m    Objective:   Physical Exam  Musculoskeletal: Normal range of motion.       Left foot: There is normal range of motion, no tenderness, no bony tenderness and no deformity.       Feet:  Skin: Skin is warm and dry. No erythema.           Assessment & Plan:  Acute Foot Pain:  Located to plantar foot just distal to metatarsal joints. No obvious deformity, skin lesions (warts), bony development. Suspect symptoms are secondary to inflammation from increased activity. Will obtain plain films today. Start NSAID's daily for 5-7 days.  Rest, stretching exercises provided. She will update if symptoms persist.  Pleas Koch, NP

## 2018-03-21 ENCOUNTER — Other Ambulatory Visit: Payer: Self-pay | Admitting: Primary Care

## 2018-03-21 ENCOUNTER — Encounter: Payer: Self-pay | Admitting: Primary Care

## 2018-03-21 DIAGNOSIS — M79672 Pain in left foot: Secondary | ICD-10-CM

## 2018-03-21 DIAGNOSIS — G4700 Insomnia, unspecified: Secondary | ICD-10-CM

## 2018-03-21 MED ORDER — ZOLPIDEM TARTRATE 10 MG PO TABS: 5.0000 mg | ORAL_TABLET | Freq: Every evening | ORAL | 0 refills | Status: DC | PRN

## 2018-03-21 NOTE — Telephone Encounter (Signed)
Noted.  No suspicious activity noted on PMP aware site.  Refill sent to pharmacy. 

## 2018-03-21 NOTE — Telephone Encounter (Signed)
Name of Medication: zolpidem (AMBIEN) 10 MG tablet  Name of Pharmacy: [id number] - [place], [place] or Written Date and Quantity: 11/01/2017 #90  Last Office Visit and Type: 03/13/2018 Acute Visit  Next Office Visit and Type:   Last Controlled Substance Agreement Date:   Last UDS:

## 2018-04-02 ENCOUNTER — Ambulatory Visit: Payer: Self-pay | Admitting: Podiatry

## 2018-04-07 ENCOUNTER — Ambulatory Visit: Payer: Self-pay

## 2018-04-07 ENCOUNTER — Encounter: Payer: Self-pay | Admitting: Podiatry

## 2018-04-07 ENCOUNTER — Ambulatory Visit (INDEPENDENT_AMBULATORY_CARE_PROVIDER_SITE_OTHER): Payer: PPO | Admitting: Podiatry

## 2018-04-07 VITALS — BP 113/64 | HR 56 | Temp 97.8°F | Resp 15 | Ht 66.0 in | Wt 120.0 lb

## 2018-04-07 DIAGNOSIS — G5762 Lesion of plantar nerve, left lower limb: Secondary | ICD-10-CM

## 2018-04-07 DIAGNOSIS — M779 Enthesopathy, unspecified: Secondary | ICD-10-CM | POA: Diagnosis not present

## 2018-04-07 MED ORDER — MELOXICAM 7.5 MG PO TABS: 7.5000 mg | ORAL_TABLET | Freq: Every day | ORAL | 0 refills | Status: DC

## 2018-04-07 NOTE — Progress Notes (Signed)
   Subjective:    Patient ID: Morgan Herrera, female    DOB: 1950-10-27, 67 y.o.   MRN: [id number]  HPI    Review of Systems  Musculoskeletal: Positive for arthralgias, joint swelling and myalgias.  All other systems reviewed and are negative.      Objective:   Physical Exam        Assessment & Plan:

## 2018-04-17 ENCOUNTER — Telehealth: Payer: Self-pay | Admitting: Neurology

## 2018-04-17 NOTE — Telephone Encounter (Signed)
Pharmacy called and is needing an Updated Prescription on this patient's Aubagio medication. Quantity changed from 28 to 30. The Fax # is [phone]. Thanks

## 2018-04-18 MED ORDER — TERIFLUNOMIDE 14 MG PO TABS: 14.0000 mg | ORAL_TABLET | Freq: Every day | ORAL | 11 refills | Status: DC

## 2018-04-18 NOTE — Telephone Encounter (Signed)
Faxing Rx

## 2018-04-21 ENCOUNTER — Ambulatory Visit: Payer: Self-pay | Admitting: Primary Care

## 2018-04-21 NOTE — Telephone Encounter (Signed)
Pt c/o mild shortness of breath that began 04/17/18. Pt stated that it comes and goes. She initially thought it was due to high altitude at [facility]. She is usually very active but felt fatigued, weak and was having stiffness to her neck and shoulders.  Pt stated that when she got home the shortness of breath symptom was still present. Pt stated she is not short of breath at rest. She gets SOB when she does activity in her house.  Pt denies chest pain, dizziness, runny nose, cough. Pt does have a low grade fever of 99.3 (pt stated she normally runs 97 temperature.  Yesterday pt c/o stiff neck and shoulders. Pt can touch her chin to her chest. Pt stated that the pain is moderate and is constant. Earlier today she took 2 Aleve for the pain). Pt c/o mild headache. Pt stated it was not bad just mild in intensity. Pt stated that she does yoga and may have stretched it or did too much. Pt advised to try heat or cold pack to help with the neck and shoulder stiffness.  Care advice given to pt and pt verbalized understanding. Appointment made for pt Wednesday.   Reason for Disposition . [1] MILD difficulty breathing (e.g., minimal/no SOB at rest, SOB with walking, pulse <100) AND [2] NEW-onset or WORSE than normal . [1] Age > 50 AND [2] no history of prior similar neck pain  Answer Assessment - Initial Assessment Questions 1. RESPIRATORY STATUS: "Describe your breathing?" (e.g., wheezing, shortness of breath, unable to speak, severe coughing)      Shortness of breath 2. ONSET: "When did this breathing problem begin?"      04/17/18 3. PATTERN "Does the difficult breathing come and go, or has it been constant since it started?"      Come and goes  4. SEVERITY: "How bad is your breathing?" (e.g., mild, moderate, severe)    - MILD: No SOB at rest, mild SOB with walking, speaks normally in sentences, can lay down, no retractions, pulse < 100.    - MODERATE: SOB at rest, SOB with minimal exertion and  prefers to sit, cannot lie down flat, speaks in phrases, mild retractions, audible wheezing, pulse 100-120.    - SEVERE: Very SOB at rest, speaks in single words, struggling to breathe, sitting hunched forward, retractions, pulse > 120      mild 5. RECURRENT SYMPTOM: "Have you had difficulty breathing before?" If so, ask: "When was the last time?" and "What happened that time?"      Yes - pt had the Flu in February- tx the Flu 6. CARDIAC HISTORY: "Do you have any history of heart disease?" (e.g., heart attack, angina, bypass surgery, angioplasty)      no 7. LUNG HISTORY: "Do you have any history of lung disease?"  (e.g., pulmonary embolus, asthma, emphysema)     no 8. CAUSE: "What do you think is causing the breathing problem?"      Pt does not know- thought was altitude but still present at home 9. OTHER SYMPTOMS: "Do you have any other symptoms? (e.g., dizziness, runny nose, cough, chest pain, fever)     Fatigued, weakness, stiff neck and shoulders  10. PREGNANCY: "Is there any chance you are pregnant?" "When was your last menstrual period?"       n/a 11. TRAVEL: "Have you traveled out of the country in the last month?" (e.g., travel history, exposures)       no  Answer Assessment - Initial  Assessment Questions 1. ONSET: "When did the pain begin?"      yesterday 2. LOCATION: "Where does it hurt?"      Stiff neck and shoulders 3. PATTERN "Does the pain come and go, or has it been constant since it started?"      constant 4. SEVERITY: "How bad is the pain?"  (Scale 1-10; or mild, moderate, severe)   - MILD (1-3): doesn't interfere with normal activities    - MODERATE (4-7): interferes with normal activities or awakens from sleep    - SEVERE (8-10):  excruciating pain, unable to do any normal activities      Moderate 5-6/10 5. RADIATION: "Does the pain go anywhere else, shoot into your arms?"     no 6. CORD SYMPTOMS: "Any weakness or numbness of the arms or legs?"     no 7. CAUSE:  "What do you think is causing the neck pain?"     Pt does not know 8. NECK OVERUSE: "Any recent activities that involved turning or twisting the neck?"     yoga 9. OTHER SYMPTOMS: "Do you have any other symptoms?" (e.g., headache, fever, chest pain, difficulty breathing, neck swelling)     Headache (mild), low grade fever of 99.3 (oral), shortness of breath, no neck swelling 10. PREGNANCY: "Is there any chance you are pregnant?" "When was your last menstrual period?"       n/a  Protocols used: BREATHING DIFFICULTY-A-AH, NECK PAIN OR STIFFNESS-A-AH

## 2018-04-22 NOTE — Progress Notes (Signed)
  Subjective:  Patient ID: Morgan Herrera, female    DOB: 03/04/1951,  MRN: [id number]  Chief Complaint  Patient presents with  . Foot Pain    L sub met 2nd and 3rd x 1 mo; 7/10 shapr constant pain when walking -no innury Tx: advil and icing   67 y.o. female presents with the above complaint.  Reports pain underneath the ball of the second third and fourth toes.  Generalized forefoot pain.  Reports an intense sharp constant pain with walking.  Denies injury.  Tried Advil and icing.  Had x-rays 2 weeks ago and was prescribed anti-inflammatory medicine.  Past Medical History:  Diagnosis Date  . Migraine   . Multiple sclerosis ([place])   . Neuromuscular disorder ([place])   . PMR (polymyalgia rheumatica) (HCC)    Past Surgical History:  Procedure Laterality Date  . BREAST EXCISIONAL BIOPSY    . BREAST SURGERY  1990  . COLONOSCOPY WITH PROPOFOL N/A 05/19/2015   Procedure: COLONOSCOPY WITH PROPOFOL;  Surgeon: Hulen Luster, MD;  Location: [facility] ENDOSCOPY;  Service: Gastroenterology;  Laterality: N/A;  . EYE SURGERY     1978 1st of 3 eye surgeries  . TENDON REPAIR      Current Outpatient Medications:  .  AUBAGIO 14 MG TABS, Take 1 tablet by mouth once per day, Disp: 28 tablet, Rfl: 11 .  BIOTIN PO, Take 1 tablet by mouth daily., Disp: , Rfl:  .  cholecalciferol (VITAMIN D) 1000 units tablet, Take 2,000 Units by mouth daily., Disp: , Rfl:  .  cyanocobalamin 500 MCG tablet, Take 500 mcg by mouth daily., Disp: , Rfl:  .  escitalopram (LEXAPRO) 5 MG tablet, TAKE 1 TABLET BY MOUTH EVERY DAY, Disp: 90 tablet, Rfl: 1 .  furosemide (LASIX) 20 MG tablet, TAKE 1 TABLET BY MOUTH EVERY DAY, Disp: 90 tablet, Rfl: 0 .  meloxicam (MOBIC) 7.5 MG tablet, Take 1 tablet (7.5 mg total) by mouth daily., Disp: 30 tablet, Rfl: 0 .  zolpidem (AMBIEN) 10 MG tablet, Take 0.5-1 tablets (5-10 mg total) by mouth at bedtime as needed for sleep., Disp: 90 tablet, Rfl: 0 .  Teriflunomide (AUBAGIO) 14 MG TABS, Take 14 mg by  mouth daily., Disp: 30 tablet, Rfl: 11  No Known Allergies Review of Systems: Negative except as noted in the HPI. Denies N/V/F/Ch. Objective:   Vitals:   04/07/18 1041  BP: 113/64  Pulse: (!) 56  Resp: 15  Temp: 97.8 F (36.6 C)   General AA&O x3. Normal mood and affect.  Vascular Dorsalis pedis and posterior tibial pulses  present 2+ bilaterally  Capillary refill normal to all digits. Pedal hair growth normal.  Neurologic Epicritic sensation grossly present.  Dermatologic No open lesions. Interspaces clear of maceration. Nails well groomed and normal in appearance.  Orthopedic: MMT 5/5 in dorsiflexion, plantarflexion, inversion, and eversion. Normal joint ROM without pain or crepitus. Pain palpation of the left third interspace Mulder's click, pain at the second third [place]:  Patient was evaluated and treated and all questions answered.  Morton's neuroma left, capsulitis -X-rays reviewed -Injection delivered left third interspace -Pads dispensed for lesser metatarsals -Rx Meloxicam  Procedure: Neuroma Injection Location: Left 3rd interspace Skin Prep: Alcohol. Injectate: 0.5 cc 0.5% marcaine plain, 0.5 cc dexamethasone phosphate. Disposition: Patient tolerated procedure well. Injection site dressed with a band-aid.   Return in about 3 weeks (around 04/28/2018) for capsulitis, neuroma left.

## 2018-04-23 ENCOUNTER — Emergency Department: Payer: PPO

## 2018-04-23 ENCOUNTER — Encounter: Payer: Self-pay | Admitting: Family Medicine

## 2018-04-23 ENCOUNTER — Ambulatory Visit (INDEPENDENT_AMBULATORY_CARE_PROVIDER_SITE_OTHER)
Admission: RE | Admit: 2018-04-23 | Discharge: 2018-04-23 | Disposition: A | Discharge status: Home / Self Care | Payer: PPO | Source: Ambulatory Visit | Attending: Family Medicine | Admitting: Family Medicine

## 2018-04-23 ENCOUNTER — Inpatient Hospital Stay
Admission: EM | Admit: 2018-04-23 | Discharge: 2018-04-25 | DRG: 287 | Disposition: A | Discharge status: Home / Self Care | Payer: PPO | Attending: Internal Medicine | Admitting: Internal Medicine

## 2018-04-23 ENCOUNTER — Encounter: Payer: Self-pay | Admitting: Emergency Medicine

## 2018-04-23 ENCOUNTER — Other Ambulatory Visit: Payer: Self-pay

## 2018-04-23 ENCOUNTER — Ambulatory Visit (INDEPENDENT_AMBULATORY_CARE_PROVIDER_SITE_OTHER): Payer: PPO | Admitting: Family Medicine

## 2018-04-23 VITALS — BP 124/66 | HR 88 | Temp 98.3°F | Wt 124.5 lb

## 2018-04-23 DIAGNOSIS — M353 Polymyalgia rheumatica: Secondary | ICD-10-CM | POA: Diagnosis present

## 2018-04-23 DIAGNOSIS — R0789 Other chest pain: Secondary | ICD-10-CM

## 2018-04-23 DIAGNOSIS — Z820 Family history of epilepsy and other diseases of the nervous system: Secondary | ICD-10-CM | POA: Diagnosis not present

## 2018-04-23 DIAGNOSIS — R0602 Shortness of breath: Secondary | ICD-10-CM

## 2018-04-23 DIAGNOSIS — R739 Hyperglycemia, unspecified: Secondary | ICD-10-CM | POA: Diagnosis present

## 2018-04-23 DIAGNOSIS — F329 Major depressive disorder, single episode, unspecified: Secondary | ICD-10-CM | POA: Diagnosis not present

## 2018-04-23 DIAGNOSIS — I340 Nonrheumatic mitral (valve) insufficiency: Secondary | ICD-10-CM | POA: Diagnosis not present

## 2018-04-23 DIAGNOSIS — E785 Hyperlipidemia, unspecified: Secondary | ICD-10-CM | POA: Diagnosis present

## 2018-04-23 DIAGNOSIS — Z803 Family history of malignant neoplasm of breast: Secondary | ICD-10-CM | POA: Diagnosis not present

## 2018-04-23 DIAGNOSIS — Z8249 Family history of ischemic heart disease and other diseases of the circulatory system: Secondary | ICD-10-CM

## 2018-04-23 DIAGNOSIS — I5031 Acute diastolic (congestive) heart failure: Secondary | ICD-10-CM | POA: Diagnosis not present

## 2018-04-23 DIAGNOSIS — G4700 Insomnia, unspecified: Secondary | ICD-10-CM | POA: Diagnosis not present

## 2018-04-23 DIAGNOSIS — F32A Depression, unspecified: Secondary | ICD-10-CM | POA: Diagnosis present

## 2018-04-23 DIAGNOSIS — Z79899 Other long term (current) drug therapy: Secondary | ICD-10-CM | POA: Diagnosis not present

## 2018-04-23 DIAGNOSIS — R911 Solitary pulmonary nodule: Secondary | ICD-10-CM | POA: Diagnosis present

## 2018-04-23 DIAGNOSIS — I509 Heart failure, unspecified: Secondary | ICD-10-CM

## 2018-04-23 DIAGNOSIS — R945 Abnormal results of liver function studies: Secondary | ICD-10-CM | POA: Diagnosis present

## 2018-04-23 DIAGNOSIS — Z87891 Personal history of nicotine dependence: Secondary | ICD-10-CM

## 2018-04-23 DIAGNOSIS — I200 Unstable angina: Secondary | ICD-10-CM

## 2018-04-23 DIAGNOSIS — I318 Other specified diseases of pericardium: Secondary | ICD-10-CM | POA: Diagnosis not present

## 2018-04-23 DIAGNOSIS — I9589 Other hypotension: Secondary | ICD-10-CM | POA: Diagnosis not present

## 2018-04-23 DIAGNOSIS — T50995A Adverse effect of other drugs, medicaments and biological substances, initial encounter: Secondary | ICD-10-CM | POA: Diagnosis not present

## 2018-04-23 DIAGNOSIS — E559 Vitamin D deficiency, unspecified: Secondary | ICD-10-CM | POA: Diagnosis present

## 2018-04-23 DIAGNOSIS — Z825 Family history of asthma and other chronic lower respiratory diseases: Secondary | ICD-10-CM | POA: Diagnosis not present

## 2018-04-23 DIAGNOSIS — R7989 Other specified abnormal findings of blood chemistry: Secondary | ICD-10-CM | POA: Diagnosis not present

## 2018-04-23 DIAGNOSIS — G35 Multiple sclerosis: Secondary | ICD-10-CM | POA: Diagnosis present

## 2018-04-23 HISTORY — DX: Heart failure, unspecified: I50.9

## 2018-04-23 LAB — COMPREHENSIVE METABOLIC PANEL
ALBUMIN: 4.1 g/dL (ref 3.5–5.2)
ALK PHOS: 205 U/L — AB (ref 39–117)
ALT: 153 U/L — AB (ref 0–35)
AST: 127 U/L — ABNORMAL HIGH (ref 0–37)
BILIRUBIN TOTAL: 0.5 mg/dL (ref 0.2–1.2)
BUN: 16 mg/dL (ref 6–23)
CALCIUM: 9.9 mg/dL (ref 8.4–10.5)
CO2: 29 mEq/L (ref 19–32)
Chloride: 100 mEq/L (ref 96–112)
Creatinine, Ser: 0.8 mg/dL (ref 0.40–1.20)
GFR: 76.13 mL/min (ref >60.00)
Glucose, Bld: 136 mg/dL — ABNORMAL HIGH (ref 70–99)
POTASSIUM: 3.9 meq/L (ref 3.5–5.1)
Sodium: 138 mEq/L (ref 135–145)
TOTAL PROTEIN: 7.5 g/dL (ref 6.0–8.3)

## 2018-04-23 LAB — CBC WITH DIFFERENTIAL/PLATELET
Basophils Absolute: 0.1 10*3/uL (ref 0.0–0.1)
Basophils Relative: 0.8 % (ref 0.0–3.0)
EOS PCT: 1.2 % (ref 0.0–5.0)
Eosinophils Absolute: 0.1 10*3/uL (ref 0.0–0.7)
HCT: 37.3 % (ref 36.0–46.0)
HEMOGLOBIN: 12.3 g/dL (ref 12.0–15.0)
LYMPHS ABS: 0.5 10*3/uL — AB (ref 0.7–4.0)
Lymphocytes Relative: 6.2 % — ABNORMAL LOW (ref 12.0–46.0)
MCHC: 33.1 g/dL (ref 30.0–36.0)
MCV: 85.1 fl (ref 78.0–100.0)
MONO ABS: 1.1 10*3/uL — AB (ref 0.1–1.0)
MONOS PCT: 12.6 % — AB (ref 3.0–12.0)
NEUTROS PCT: 79.2 % — AB (ref 43.0–77.0)
Neutro Abs: 7 10*3/uL (ref 1.4–7.7)
Platelets: 207 10*3/uL (ref 150.0–400.0)
RBC: 4.38 Mil/uL (ref 3.87–5.11)
RDW: 13.7 % (ref 11.5–15.5)
WBC: 8.8 10*3/uL (ref 4.0–10.5)

## 2018-04-23 LAB — BASIC METABOLIC PANEL
Anion gap: 11 (ref 5–15)
BUN: 17 mg/dL (ref 8–23)
CALCIUM: 9.2 mg/dL (ref 8.9–10.3)
CO2: 27 mmol/L (ref 22–32)
CREATININE: 0.69 mg/dL (ref 0.44–1.00)
Chloride: 99 mmol/L (ref 98–111)
GFR calc non Af Amer: >60 mL/min (ref >60)
Glucose, Bld: 105 mg/dL — ABNORMAL HIGH (ref 70–99)
Potassium: 3.7 mmol/L (ref 3.5–5.1)
SODIUM: 137 mmol/L (ref 135–145)

## 2018-04-23 LAB — TROPONIN I
TNIDX: 0.09 ug/l — ABNORMAL HIGH (ref 0.00–0.06)
TROPONIN I: 0.06 ng/mL — AB (ref <0.03)

## 2018-04-23 LAB — HEPATIC FUNCTION PANEL
ALBUMIN: 3.8 g/dL (ref 3.5–5.0)
ALT: 162 U/L — ABNORMAL HIGH (ref 0–44)
AST: 137 U/L — AB (ref 15–41)
Alkaline Phosphatase: 209 U/L — ABNORMAL HIGH (ref 38–126)
Bilirubin, Direct: 0.1 mg/dL (ref 0.0–0.2)
Indirect Bilirubin: 0.6 mg/dL (ref 0.3–0.9)
TOTAL PROTEIN: 7.6 g/dL (ref 6.5–8.1)
Total Bilirubin: 0.7 mg/dL (ref 0.3–1.2)

## 2018-04-23 LAB — CBC
HCT: 36.5 % (ref 35.0–47.0)
HEMOGLOBIN: 12.3 g/dL (ref 12.0–16.0)
MCH: 28.7 pg (ref 26.0–34.0)
MCHC: 33.6 g/dL (ref 32.0–36.0)
MCV: 85.3 fL (ref 80.0–100.0)
PLATELETS: 209 10*3/uL (ref 150–440)
RBC: 4.28 MIL/uL (ref 3.80–5.20)
RDW: 13.9 % (ref 11.5–14.5)
WBC: 8.3 10*3/uL (ref 3.6–11.0)

## 2018-04-23 LAB — BRAIN NATRIURETIC PEPTIDE: B Natriuretic Peptide: 483 pg/mL — ABNORMAL HIGH (ref 0.0–100.0)

## 2018-04-23 LAB — D-DIMER, QUANTITATIVE (NOT AT ARMC): D DIMER QUANT: 1.68 ug{FEU}/mL — AB (ref <0.50)

## 2018-04-23 MED ORDER — ONDANSETRON HCL 4 MG/2ML IJ SOLN: 4.0000 mg | Freq: Four times a day (QID) | INTRAMUSCULAR | Status: DC | PRN

## 2018-04-23 MED ORDER — ENOXAPARIN SODIUM 40 MG/0.4ML ~~LOC~~ SOLN
40.0000 mg | SUBCUTANEOUS | Status: DC
  Administered 2018-04-24: 40 mg via SUBCUTANEOUS
  Filled 2018-04-23: qty 0.4

## 2018-04-23 MED ORDER — ZOLPIDEM TARTRATE 5 MG PO TABS
5.0000 mg | ORAL_TABLET | Freq: Every evening | ORAL | Status: DC | PRN
  Administered 2018-04-23 – 2018-04-24 (×2): 5 mg via ORAL
  Filled 2018-04-23 (×2): qty 1

## 2018-04-23 MED ORDER — IOPAMIDOL (ISOVUE-370) INJECTION 76%
75.0000 mL | Freq: Once | INTRAVENOUS | Status: AC | PRN
  Administered 2018-04-23: 75 mL via INTRAVENOUS

## 2018-04-23 MED ORDER — ESCITALOPRAM OXALATE 10 MG PO TABS
5.0000 mg | ORAL_TABLET | Freq: Every day | ORAL | Status: DC
  Administered 2018-04-24: 5 mg via ORAL
  Filled 2018-04-23 (×2): qty 0.5

## 2018-04-23 MED ORDER — ONDANSETRON HCL 4 MG PO TABS: 4.0000 mg | ORAL_TABLET | Freq: Four times a day (QID) | ORAL | Status: DC | PRN

## 2018-04-23 MED ORDER — FUROSEMIDE 40 MG PO TABS
20.0000 mg | ORAL_TABLET | Freq: Every day | ORAL | Status: DC
  Administered 2018-04-24: 20 mg via ORAL
  Filled 2018-04-23: qty 1

## 2018-04-23 MED ORDER — ACETAMINOPHEN 325 MG PO TABS
650.0000 mg | ORAL_TABLET | Freq: Four times a day (QID) | ORAL | Status: DC | PRN
  Administered 2018-04-23 – 2018-04-24 (×3): 650 mg via ORAL
  Filled 2018-04-23 (×3): qty 2

## 2018-04-23 MED ORDER — ACETAMINOPHEN 650 MG RE SUPP: 650.0000 mg | Freq: Four times a day (QID) | RECTAL | Status: DC | PRN

## 2018-04-23 NOTE — ED Triage Notes (Signed)
Patient presents to the ED with shortness of breath since last Thursday and generalized weakness.  Patient went to her PCP today and was sent to the ED for an elevated Troponin at  .09 and an elevated d-dimer.  Patient is in no obvious distress at this time.  Patient reports normally being very active.

## 2018-04-23 NOTE — H&P (Signed)
[place] [facility] [place] NAME: Morgan Herrera    MR#:  [id number]  DATE OF BIRTH:  1951/06/04  DATE OF ADMISSION:  04/23/2018  PRIMARY CARE PHYSICIAN: Pleas Koch, NP   REQUESTING/REFERRING PHYSICIAN: Mable Paris, MD  CHIEF COMPLAINT:   Chief Complaint  Patient presents with  . Shortness of Breath  . Weakness  . Abnormal Lab    HISTORY OF PRESENT ILLNESS:  Morgan Herrera  is a 67 y.o. female who presents with 1 week of increasing dyspnea on exertion.  Patient states she has no prior history of heart disease or heart failure.  She has had associated neck and shoulder pain throughout this week as well.  Here in the ED her EKG was within normal limits, but her troponin was mildly elevated at 0.06, and her BNP was elevated in the 400s.  She denies any peripheral edema, and did not have any pulmonary edema on imaging.  Hospitalist were called for admission and further evaluation  PAST MEDICAL HISTORY:   Past Medical History:  Diagnosis Date  . Migraine   . Multiple sclerosis ([place])   . Neuromuscular disorder ([place])   . PMR (polymyalgia rheumatica) (HCC)      PAST SURGICAL HISTORY:   Past Surgical History:  Procedure Laterality Date  . BREAST EXCISIONAL BIOPSY    . BREAST SURGERY  1990  . COLONOSCOPY WITH PROPOFOL N/A 05/19/2015   Procedure: COLONOSCOPY WITH PROPOFOL;  Surgeon: Hulen Luster, MD;  Location[facility] END[facility]COPY;  Service: Gastroenterology;  Laterality: N/A;  . EYE SURGERY     1978 1st of 3 eye surgeries  . TENDON REPAIR       SOCIAL HISTORY:   Social History   Tobacco Use  . Smoking status: Former Research scientist (life sciences)  . Smokeless tobacco: Never Used  Substance Use Topics  . Alcohol use: Yes    Alcohol/week: 0.0 oz    Comment: 1 to 2 glass of wine at least 4 nights a week     FAMILY HISTORY:   Family History  Problem Relation Age of Onset  . Heart disease Mother   . Hypertension Mother   . Parkinson's disease  Mother   . Parkinsonism Mother   . Cancer Maternal Grandmother        breast  . Breast cancer Maternal Grandmother   . Asthma Father      DRUG ALLERGIES:  No Known Allergies  MEDICATIONS AT HOME:   Prior to Admission medications   Medication Sig Start Date End Date Taking? Authorizing Provider  BIOTIN PO Take 1 tablet by mouth daily.   Yes [provider]  calcium carbonate (OS-CAL - DOSED IN MG OF ELEMENTAL CALCIUM) 1250 (500 Ca) MG tablet Take 1 tablet by mouth daily.   Yes [provider]  cholecalciferol (VITAMIN D) 1000 units tablet Take 2,000 Units by mouth daily.   Yes [provider]  cyanocobalamin 500 MCG tablet Take 500 mcg by mouth daily.   Yes [provider]  escitalopram (LEXAPRO) 5 MG tablet TAKE 1 TABLET BY MOUTH EVERY DAY 09/11/17  Yes Pleas Koch, NP  furosemide (LASIX) 20 MG tablet TAKE 1 TABLET BY MOUTH EVERY DAY 02/27/18  Yes Pleas Koch, NP  Teriflunomide (AUBAGIO) 14 MG TABS Take 14 mg by mouth daily. 04/18/18  Yes Jaffe, Adam R, DO  zolpidem (AMBIEN) 10 MG tablet Take 0.5-1 tablets (5-10 mg total) by mouth at bedtime as needed for sleep. 03/21/18  Yes Pleas Koch, NP    REVIEW OF SYSTEMS:  Review of Systems  Constitutional: Negative for chills, fever, malaise/fatigue and weight loss.  HENT: Negative for ear pain, hearing loss and tinnitus.   Eyes: Negative for blurred vision, double vision, pain and redness.  Respiratory: Positive for shortness of breath. Negative for cough and hemoptysis.   Cardiovascular: Negative for chest pain, palpitations, orthopnea and leg swelling.  Gastrointestinal: Negative for abdominal pain, constipation, diarrhea, nausea and vomiting.  Genitourinary: Negative for dysuria, frequency and hematuria.  Musculoskeletal: Negative for back pain, joint pain and neck pain.  Skin:       No acne, rash, or lesions  Neurological: Negative for dizziness, tremors, focal weakness and  weakness.  Endo/Heme/Allergies: Negative for polydipsia. Does not bruise/bleed easily.  Psychiatric/Behavioral: Negative for depression. The patient is not nervous/anxious and does not have insomnia.      VITAL SIGNS:   Vitals:   04/23/18 1713 04/23/18 1815 04/23/18 1830 04/23/18 2045  BP:  112/80 101/78 94/79  Pulse:  63 65 73  Resp:  14  15  Temp:      TempSrc:      SpO2:  100% 100% 99%  Weight: 55.8 kg (123 lb)     Height: 5\' 6"  (1.676 m)      Wt Readings from Last 3 Encounters:  04/23/18 55.8 kg (123 lb)  04/23/18 56.5 kg (124 lb 8 oz)  04/07/18 54.4 kg (120 lb)    PHYSICAL EXAMINATION:  Physical Exam  Vitals reviewed. Constitutional: She is oriented to person, place, and time. She appears well-developed and well-nourished. No distress.  HENT:  Head: Normocephalic and atraumatic.  Mouth/Throat: Oropharynx is clear and moist.  Eyes: Pupils are equal, round, and reactive to light. Conjunctivae and EOM are normal. No scleral icterus.  Neck: Normal range of motion. Neck supple. No JVD present. No thyromegaly present.  Cardiovascular: Normal rate, regular rhythm and intact distal pulses. Exam reveals no gallop and no friction rub.  No murmur heard. Respiratory: Effort normal and breath sounds normal. No respiratory distress. She has no wheezes. She has no rales.  GI: Soft. Bowel sounds are normal. She exhibits no distension. There is no tenderness.  Musculoskeletal: Normal range of motion. She exhibits no edema.  No arthritis, no gout  Lymphadenopathy:    She has no cervical adenopathy.  Neurological: She is alert and oriented to person, place, and time. No cranial nerve deficit.  No dysarthria, no aphasia  Skin: Skin is warm and dry. No rash noted. No erythema.  Psychiatric: She has a normal mood and affect. Her behavior is normal. Judgment and thought content normal.    LABORATORY PANEL:   CBC Recent Labs  Lab 04/23/18 1744  WBC 8.3  HGB 12.3  HCT 36.5  PLT  209   ------------------------------------------------------------------------------------------------------------------  Chemistries  Recent Labs  Lab 04/23/18 1744  NA 137  K 3.7  CL 99  CO2 27  GLUCOSE 105*  BUN 17  CREATININE 0.69  CALCIUM 9.2  AST 137*  ALT 162*  ALKPHOS 209*  BILITOT 0.7   ------------------------------------------------------------------------------------------------------------------  Cardiac Enzymes Recent Labs  Lab 04/23/18 1744  TROPONINI 0.06*   ------------------------------------------------------------------------------------------------------------------  RADIOLOGY:  Dg Chest 2 View  Result Date: 04/23/2018 CLINICAL DATA:  Shortness of breath for 7 days with chest pressure EXAM: CHEST - 2 VIEW COMPARISON:  None. FINDINGS: The heart size and mediastinal contours are within normal limits. Both lungs are clear. The visualized skeletal structures are unremarkable.  IMPRESSION: No evidence of disease. Electronically Signed   By: Monte Fantasia M.D.   On: 04/23/2018 12:35   Ct Angio Chest Pe W/cm &/or Wo Cm  Result Date: 04/23/2018 CLINICAL DATA:  Short of breath and weakness elevated D-dimer EXAM: CT ANGIOGRAPHY CHEST WITH CONTRAST TECHNIQUE: Multidetector CT imaging of the chest was performed using the standard protocol during bolus administration of intravenous contrast. Multiplanar CT image reconstructions and MIPs were obtained to evaluate the vascular anatomy. CONTRAST:  5mL ISOVUE-370 IOPAMIDOL (ISOVUE-370) INJECTION 76% COMPARISON:  Chest x-ray 04/23/2018 FINDINGS: Cardiovascular: Satisfactory opacification of the pulmonary arteries to the segmental level. No evidence of pulmonary embolism. Normal heart size. No pericardial effusion. Mild aortic atherosclerosis. No aneurysmal dilatation. Mediastinum/Nodes: Midline trachea. No thyroid mass. No significant adenopathy. Esophagus within normal limits. Circumscribed fat density lesion along the  right pericardial surface measuring 2.8 cm, suggestive of a lipoma. Lungs/Pleura: No acute consolidation, pleural effusion or pneumothorax. Small spiculated opacity in the left lung apex measuring 9 mm average diameter with possible small central cavitation on axial images. Upper Abdomen: No acute abnormality. Musculoskeletal: No chest wall abnormality. No acute or significant osseous findings. Review of the MIP images confirms the above findings. IMPRESSION: 1. Negative for acute pulmonary embolus. 2. 9 mm small spiculated nodular density at the left lung apex. Consider one of the following in 3 months for both low-risk and high-risk individuals: (a) repeat chest CT, (b) follow-up PET-CT, or (c) tissue sampling. This recommendation follows the consensus statement: Guidelines for Management of Incidental Pulmonary Nodules Detected on CT Images: From the Fleischner Society 2017; Radiology 2017; 284:228-243. 3. Circumscribed 2.8 cm fat density lesion along the right pericardial surface, may reflect a lipoma Aortic Atherosclerosis (ICD10-I70.0). Electronically Signed   By: Donavan Foil M.D.   On: 04/23/2018 19:36    EKG:   Orders placed or performed during the hospital encounter of 04/23/18  . ED EKG within 10 minutes  . ED EKG within 10 minutes    IMPRESSION AND PLAN:  Principal Problem:   Acute CHF (congestive heart failure) ([place]) -undetermined whether systolic or diastolic, and undetermined etiology as of yet.  Patient's BNP was elevated.  Get echocardiogram for further evaluation, trend cardiac enzymes and get a cardiology consult Active Problems:   Multiple sclerosis ([place]) -continue home meds   PMR (polymyalgia rheumatica) (HCC) -continue home medications   Depression -home dose antidepressant  Chart review performed and case discussed with ED provider. Labs, imaging and/or ECG reviewed by provider and discussed with patient/family. Management plans discussed with the patient and/or  family.  DVT PROPHYLAXIS: SubQ lovenox  GI PROPHYLAXIS: None  ADMISSION STATUS: Inpatient  CODE STATUS: Full  TOTAL TIME TAKING CARE OF THIS PATIENT: 45 minutes.   Diogenes Whirley De Borgia 04/23/2018, 9:34 PM  [facility]  Office  [phone]  CC: Primary care physician; Pleas Koch, NP  Note:  This document was prepared using Dragon voice recognition software and may include unintentional dictation errors.

## 2018-04-23 NOTE — ED Notes (Signed)
Writer Transporting Pt to [phone]

## 2018-04-23 NOTE — ED Notes (Signed)
Pt states "arms and legs feel rubbery" referring to a weakness/wobbly feeling. Pt denies any pain in chest, but feels a heaviness in chest that comes and goes.

## 2018-04-23 NOTE — ED Provider Notes (Signed)
[facility] Emergency Department Provider Note  ____________________________________________   First MD Initiated Contact with Patient 04/23/18 1757     (approximate)  I have reviewed the triage vital signs and the nursing notes.   HISTORY  Chief Complaint Shortness of Breath; Weakness; and Abnormal Lab    HPI Morgan Herrera is a 67 y.o. female who comes to the emergency department with exertional shortness of breath over the past week or so.  She is normally very active doing yoga and walking several miles every day.  For the past week she cannot walk more than several steps without feeling exhausted and out of breath.  No history of DVT or pulmonary embolism.  She has minimal chest pain with exertion.  The pain is sharp aching mild substernal and nonradiating.  No recent airplane travel or surgery.    Past Medical History:  Diagnosis Date  . Migraine   . Multiple sclerosis ([place])   . Neuromuscular disorder ([place])   . PMR (polymyalgia rheumatica) [facility])     Patient Active Problem List   Diagnosis Date Noted  . Unstable angina ([place])   . PMR (polymyalgia rheumatica) ([facility]) 04/23/2018  . Acute CHF (congestive heart failure) ([place]) 04/23/2018  . Welcome to Medicare preventive visit 04/10/2017  . Constipation 03/19/2016  . 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 Insomnia 09/19/2015  . Preventative health care 03/04/2015  . Vitamin D deficiency 03/04/2015  . Lower extremity edema 02/11/2015  . Multiple sclerosis ([place]) 02/11/2015  . Depression 02/11/2015    Past Surgical History:  Procedure Laterality Date  . BREAST EXCISIONAL BIOPSY    . BREAST SURGERY  1990  . COLONOSCOPY WITH PROPOFOL N/A 05/19/2015   Procedure: COLONOSCOPY WITH PROPOFOL;  Surgeon: Hulen Luster, MD;  [facility] ENDOSCOPY;  Service: Gastroenterology;  Laterality: N/A;  . EYE SURGERY     1978 1st of 3 eye surgeries  . TENDON REPAIR      Prior to Admission  medications   Medication Sig Start Date End Date Taking? Authorizing Provider  BIOTIN PO Take 1 tablet by mouth daily.   Yes [provider]  calcium carbonate (OS-CAL - DOSED IN MG OF ELEMENTAL CALCIUM) 1250 (500 Ca) MG tablet Take 1 tablet by mouth daily.   Yes [provider]  cholecalciferol (VITAMIN D) 1000 units tablet Take 2,000 Units by mouth daily.   Yes [provider]  cyanocobalamin 500 MCG tablet Take 500 mcg by mouth daily.   Yes [provider]  escitalopram (LEXAPRO) 5 MG tablet TAKE 1 TABLET BY MOUTH EVERY DAY 09/11/17  Yes Pleas Koch, NP  furosemide (LASIX) 20 MG tablet TAKE 1 TABLET BY MOUTH EVERY DAY 02/27/18  Yes Pleas Koch, NP  Teriflunomide (AUBAGIO) 14 MG TABS Take 14 mg by mouth daily. 04/18/18  Yes Jaffe, Adam R, DO  zolpidem (AMBIEN) 10 MG tablet Take 0.5-1 tablets (5-10 mg total) by mouth at bedtime as needed for sleep. 03/21/18  Yes Pleas Koch, NP    Allergies Patient has no known allergies.  Family History  Problem Relation Age of Onset  . Heart disease Mother   . Hypertension Mother   . Parkinson's disease Mother   . Parkinsonism Mother   . Cancer Maternal Grandmother        breast  . Breast cancer Maternal Grandmother   . Asthma Father     Social History Social History   Tobacco Use  . Smoking status:  Former Smoker  . Smokeless tobacco: Never Used  Substance Use Topics  . Alcohol use: Yes    Alcohol/week: 0.0 oz    Comment: 1 to 2 glass of wine at least 4 nights a week  . Drug use: No    Review of Systems Constitutional: No fever/chills Eyes: No visual changes. ENT: No sore throat. Cardiovascular: Positive for chest pain. Respiratory: Positive for shortness of breath. Gastrointestinal: No abdominal pain.  No nausea, no vomiting.  No diarrhea.  No constipation. Genitourinary: Negative for dysuria. Musculoskeletal: Negative for back pain. Skin: Negative for rash. Neurological:  Negative for headaches, focal weakness or numbness.   ____________________________________________   PHYSICAL EXAM:  VITAL SIGNS: ED Triage Vitals  Enc Vitals Group     BP 04/23/18 1712 124/75     Pulse Rate 04/23/18 1712 63     Resp 04/23/18 1712 20     Temp 04/23/18 1712 98.7 F (37.1 C)     Temp Source 04/23/18 1712 Oral     SpO2 04/23/18 1712 99 %     Weight 04/23/18 1713 123 lb (55.8 kg)     Height 04/23/18 1713 5\' 6"  (1.676 m)     Head Circumference --      Peak Flow --      Pain Score 04/23/18 1732 0     Pain Loc --      Pain Edu? --      Excl. in [place]? --     Constitutional: Alert and oriented x4 appears obviously short of breath nontoxic in no diaphoresis Eyes: PERRL EOMI. Head: Atraumatic. Nose: No congestion/rhinnorhea. Mouth/Throat: No trismus Neck: No stridor.  Able to lie completely flat although with JVD Cardiovascular: Normal rate, regular rhythm. Grossly normal heart sounds.  Good peripheral circulation. Respiratory: Normal respiratory effort.  No retractions. Lungs CTAB and moving good air Gastrointestinal: Soft nontender Musculoskeletal: No lower extremity edema legs are equal in size Neurologic:  Normal speech and language. No gross focal neurologic deficits are appreciated. Skin:  Skin is warm, dry and intact. No rash noted. Psychiatric: Mood and affect are normal. Speech and behavior are normal.    ____________________________________________   DIFFERENTIAL includes but not limited to  Pulmonary embolism, pericardial effusion, acute coronary syndrome ____________________________________________   LABS (all labs ordered are listed, but only abnormal results are displayed)  Labs Reviewed  BASIC METABOLIC PANEL - Abnormal; Notable for the following components:      Result Value   Glucose, Bld 105 (*)    All other components within normal limits  TROPONIN I - Abnormal; Notable for the following components:   Troponin I 0.06 (*)    All other  components within normal limits  BRAIN NATRIURETIC PEPTIDE - Abnormal; Notable for the following components:   B Natriuretic Peptide 483.0 (*)    All other components within normal limits  HEPATIC FUNCTION PANEL - Abnormal; Notable for the following components:   AST 137 (*)    ALT 162 (*)    Alkaline Phosphatase 209 (*)    All other components within normal limits  TROPONIN I - Abnormal; Notable for the following components:   Troponin I 0.09 (*)    All other components within normal limits  TROPONIN I - Abnormal; Notable for the following components:   Troponin I 0.06 (*)    All other components within normal limits  TROPONIN I - Abnormal; Notable for the following components:   Troponin I 0.04 (*)    All other components  within normal limits  BASIC METABOLIC PANEL - Abnormal; Notable for the following components:   Glucose, Bld 114 (*)    All other components within normal limits  HEMOGLOBIN A1C - Abnormal; Notable for the following components:   Hgb A1c MFr Bld 5.9 (*)    All other components within normal limits  LIPID PANEL - Abnormal; Notable for the following components:   LDL Cholesterol 125 (*)    All other components within normal limits  CBC - Abnormal; Notable for the following components:   Hemoglobin 11.1 (*)    HCT 32.5 (*)    All other components within normal limits  BASIC METABOLIC PANEL - Abnormal; Notable for the following components:   Glucose, Bld 108 (*)    Calcium 8.6 (*)    All other components within normal limits  HEPATIC FUNCTION PANEL - Abnormal; Notable for the following components:   Total Protein 6.2 (*)    Albumin 3.0 (*)    AST 82 (*)    ALT 120 (*)    Alkaline Phosphatase 201 (*)    All other components within normal limits  CBC  HIV ANTIBODY (ROUTINE TESTING)  CBC    Lab work reviewed by me with elevated beta nitrated peptide and elevated troponin likely secondary to stretch and not primary myocardial  ischemia __________________________________________  EKG  ED ECG REPORT I, Darel Hong, the attending physician, personally viewed and interpreted this ECG.  Date: 04/23/2018 EKG Time:  Rate: 68 Rhythm: normal sinus rhythm QRS Axis: normal Intervals: normal ST/T Wave abnormalities: normal Narrative Interpretation: no evidence of acute ischemia  ____________________________________________  RADIOLOGY  Chest x-ray reviewed by me with no acute disease CT scan of the chest reviewed by me with no pulmonary embolism.  I have disclosed the patient the pulmonary nodule ____________________________________________   PROCEDURES  Procedure(s) performed: no  Procedures  Critical Care performed: no  ____________________________________________   INITIAL IMPRESSION / ASSESSMENT AND PLAN / ED COURSE  Pertinent labs & imaging results that were available during my care of the patient were reviewed by me and considered in my medical decision making (see chart for details).   As part of my medical decision making, I reviewed the following data within the [place] History obtained from family if available, nursing notes, old chart and ekg, as well as notes from prior ED visits.  The patient arrives rather well-appearing although has significant exertional shortness of breath and fatigue.  This patient is normally extremely active and is an athlete and this is abnormal for her.  EKG is nonischemic however her troponin and BNP are elevated.  CT angios obtained given unclear etiology of her exertional shortness of breath and is negative for pulmonary embolism.  At this point as the patient is unable to take several steps without feeling out of breath she does require inpatient admission for echocardiogram and further work-up and management of her significant symptoms.  Possibly unstable angina versus new heart failure.       ____________________________________________   FINAL CLINICAL IMPRESSION(S) / ED DIAGNOSES  Final diagnoses:  Shortness of breath      NEW MEDICATIONS STARTED DURING THIS VISIT:  Current Discharge Medication List       Note:  This document was prepared using Dragon voice recognition software and may include unintentional dictation errors.     Darel Hong, MD 04/25/18 [phone]

## 2018-04-23 NOTE — Progress Notes (Signed)
Subjective:    Patient ID: Morgan Herrera, female    DOB: 11-05-1950, 67 y.o.   MRN: [id number]  HPI This is a 67 yo female who presents today with increased SOB, started last week, worse [place] the [place]. Feels weak and queasy. Couldn't participate in her yoga class fully today. Doesn't feel right.  SOB worse with exertion. Occasional dry cough. Nose is stuffy. No fevers, checked her temp, was 99. No sick contacts. Metalic taste in her mouth. No new meds. 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   Past Medical History:  Diagnosis Date  . Migraine   . Multiple sclerosis ([place])   . Neuromuscular disorder ([place])   . PMR (polymyalgia rheumatica) ([facility])    Past Surgical History:  Procedure Laterality Date  . BREAST EXCISIONAL BIOPSY    . BREAST SURGERY  1990  . COLONOSCOPY WITH PROPOFOL N/A 05/19/2015   Procedure: COLONOSCOPY WITH PROPOFOL;  Surgeon: Hulen Luster, MD;  Location: [facility] ENDOSCOPY;  Service: Gastroenterology;  Laterality: N/A;  . EYE SURGERY     1978 1st of 3 eye surgeries  . TENDON REPAIR     Family History  Problem Relation Age of Onset  . Heart disease Mother   . Hypertension Mother   . Parkinson's disease Mother   . Parkinsonism Mother   . Cancer Maternal Grandmother        breast  . Breast cancer Maternal Grandmother   . Asthma Father    Social History   Tobacco Use  . Smoking status: Former Research scientist (life sciences)  . Smokeless tobacco: Never Used  Substance Use Topics  . Alcohol use: Yes    Alcohol/week: 0.0 oz    Comment: 1 to 2 glass of wine at least 4 nights a week  . Drug use: No      Review of Systems Per HPI    Objective:   Physical Exam Physical Exam  Constitutional: Oriented to person, place, and time. She appears well-developed and well-nourished.  HENT:  Head: Normocephalic and atraumatic.  Eyes: Conjunctivae are normal.  Neck: Normal range  of motion. Neck supple.  Cardiovascular: Normal rate, regular rhythm and normal heart sounds.   Pulmonary/Chest: Effort normal and breath sounds normal.  Musculoskeletal: Normal range of motion.  Neurological: Alert and oriented to person, place, and time.  Skin: Skin is warm and dry.  Psychiatric: Normal mood and affect. Behavior is normal. Judgment and thought content normal.  Vitals reviewed.     BP 124/66   Pulse 88   Temp 98.3 F (36.8 C) (Oral)   Wt 124 lb 8 oz (56.5 kg)   SpO2 93%   BMI 20.09 kg/m  Wt Readings from Last 3 Encounters:  04/23/18 124 lb 8 oz (56.5 kg)  04/07/18 120 lb (54.4 kg)  03/13/18 124 lb 4 oz (56.4 kg)   Repeat pulse ox 96%  EKG- NSR, rate 63, PR 150, QT 396; compared to prior tracing  Office Spirometry Results: Best results as below-  FVC 94% FEV1 72% FEV1/FVC 76% Fef 25-75% 65% Mild obstruction       Assessment & Plan:  1. Shortness of breath - unclear etiology, will r/o PE with D-dimer, check Troponin - EKG 12-Lead - Comprehensive metabolic panel - CBC with Differential - D-dimer, quantitative (not at [facility]) - Troponin I - Spirometry with Graph; Future - Spirometry with  Graph - DG Chest 2 View; Future  2. Chest pressure - as above - EKG 12-Lead - Comprehensive metabolic panel - CBC with Differential - D-dimer, quantitative (not at [facility]) - Troponin I - DG Chest 2 View; Future  - Discussed plan with patient, will notify her of labs later today  Clarene Reamer, FNP-BC  [place] [facility], [place]  04/23/2018 12:28 PM

## 2018-04-23 NOTE — Patient Instructions (Signed)
Good to see you today, please go to lab and xray  I will notify you of results later today  Drink enough water to make your urine light yellow and take it easy

## 2018-04-23 NOTE — ED Notes (Signed)
Patient reports occasionally feeling like a "cat" is sitting on her chest and some stiffness in her neck and shoulders.  Patient reports intermittent headache.

## 2018-04-24 ENCOUNTER — Inpatient Hospital Stay (HOSPITAL_BASED_OUTPATIENT_CLINIC_OR_DEPARTMENT_OTHER): Payer: PPO

## 2018-04-24 ENCOUNTER — Inpatient Hospital Stay (HOSPITAL_COMMUNITY)
Admit: 2018-04-24 | Discharge: 2018-04-24 | Disposition: A | Discharge status: Home / Self Care | Payer: PPO | Attending: Internal Medicine | Admitting: Internal Medicine

## 2018-04-24 DIAGNOSIS — I200 Unstable angina: Secondary | ICD-10-CM

## 2018-04-24 DIAGNOSIS — I340 Nonrheumatic mitral (valve) insufficiency: Secondary | ICD-10-CM

## 2018-04-24 LAB — BASIC METABOLIC PANEL
Anion gap: 9 (ref 5–15)
BUN: 14 mg/dL (ref 8–23)
CHLORIDE: 105 mmol/L (ref 98–111)
CO2: 28 mmol/L (ref 22–32)
Calcium: 8.9 mg/dL (ref 8.9–10.3)
Creatinine, Ser: 0.75 mg/dL (ref 0.44–1.00)
GFR calc Af Amer: >60 mL/min (ref >60)
GFR calc non Af Amer: >60 mL/min (ref >60)
Glucose, Bld: 114 mg/dL — ABNORMAL HIGH (ref 70–99)
POTASSIUM: 3.7 mmol/L (ref 3.5–5.1)
SODIUM: 142 mmol/L (ref 135–145)

## 2018-04-24 LAB — LIPID PANEL
CHOL/HDL RATIO: 3.8 ratio
CHOLESTEROL: 200 mg/dL (ref 0–200)
HDL: 53 mg/dL (ref >40)
LDL Cholesterol: 125 mg/dL — ABNORMAL HIGH (ref 0–99)
Triglycerides: 110 mg/dL (ref <150)
VLDL: 22 mg/dL (ref 0–40)

## 2018-04-24 LAB — CBC
HEMATOCRIT: 37.1 % (ref 35.0–47.0)
Hemoglobin: 12.4 g/dL (ref 12.0–16.0)
MCH: 28.5 pg (ref 26.0–34.0)
MCHC: 33.5 g/dL (ref 32.0–36.0)
MCV: 85.1 fL (ref 80.0–100.0)
Platelets: 200 10*3/uL (ref 150–440)
RBC: 4.36 MIL/uL (ref 3.80–5.20)
RDW: 13.7 % (ref 11.5–14.5)
WBC: 8 10*3/uL (ref 3.6–11.0)

## 2018-04-24 LAB — HEMOGLOBIN A1C
Hgb A1c MFr Bld: 5.9 % — ABNORMAL HIGH (ref 4.8–5.6)
MEAN PLASMA GLUCOSE: 122.63 mg/dL

## 2018-04-24 LAB — TROPONIN I
Troponin I: 0.09 ng/mL (ref <0.03)
Troponin I: 0.06 ng/mL (ref <0.03)
Troponin I: 0.04 ng/mL (ref <0.03)

## 2018-04-24 LAB — ECHOCARDIOGRAM COMPLETE
Height: 66 in
Weight: 1961.21 oz

## 2018-04-24 MED ORDER — SODIUM CHLORIDE 0.9% FLUSH: 3.0000 mL | INTRAVENOUS | Status: DC | PRN

## 2018-04-24 MED ORDER — SODIUM CHLORIDE 0.9 % IV SOLN: 250.0000 mL | INTRAVENOUS | Status: DC | PRN

## 2018-04-24 MED ORDER — SODIUM CHLORIDE 0.9% FLUSH
3.0000 mL | Freq: Two times a day (BID) | INTRAVENOUS | Status: DC
  Administered 2018-04-24: 3 mL via INTRAVENOUS

## 2018-04-24 MED ORDER — SODIUM CHLORIDE 0.9 % WEIGHT BASED INFUSION
3.0000 mL/kg/h | INTRAVENOUS | Status: DC
  Administered 2018-04-25: 3 mL/kg/h via INTRAVENOUS

## 2018-04-24 MED ORDER — SODIUM CHLORIDE 0.9 % WEIGHT BASED INFUSION
1.0000 mL/kg/h | INTRAVENOUS | Status: DC
  Administered 2018-04-25: 1 mL/kg/h via INTRAVENOUS

## 2018-04-24 MED ORDER — TECHNETIUM TC 99M TETROFOSMIN IV KIT
13.9900 | PACK | Freq: Once | INTRAVENOUS | Status: AC | PRN
  Administered 2018-04-24: 13.99 via INTRAVENOUS

## 2018-04-24 MED ORDER — ASPIRIN 81 MG PO CHEW
81.0000 mg | CHEWABLE_TABLET | Freq: Once | ORAL | Status: AC
  Administered 2018-04-25: 81 mg via ORAL
  Filled 2018-04-24: qty 1

## 2018-04-24 NOTE — Consult Note (Signed)
Cardiology Consultation:   Patient ID: Morgan Herrera; [id number]; 1950/11/21   Admit date: 04/23/2018 Date of Consult: 04/24/2018  Primary Care Provider: Pleas Koch, NP Primary Cardiologist: New to [facility] - consult by Fletcher Anon   Patient Profile:   Morgan Herrera is a 67 y.o. female with a hx of MS, optic neuritis, PMR, prior tobacco abuse from age 80 to 30, and migraine disorder who is being seen today for the evaluation of elevated BNP at the request of Dr. Jannifer Franklin, MD.  History of Present Illness:   Ms. Calles has no previously known cardiac history. She is typically very active, walks several miles 3 days per week and participates in yoga 3 days per week.  Patient saw her PCP on 7/24 with a two week history of increased SOB with exertion with associated chest pressure that feels like "my cat is sitting on my chest." Symptoms improve with rest. She has never had similar symptoms. No prior ischemic evaluation. Weight has been stable. She takes Lasix 20 mg daily prn for lower extremity swelling and has not noticed any change in this. She felt like she has getting a cold. Labs were checked as an outpatient which showed a normal CBC, CMET with LFT as below, and D-dimer that was elevated at 1.68. She was advised to go to the ED.   Upon the patient's arrival to [facility] they were found to have BP 124/75-->96/54, HR 63 bpm, temp 98.7, oxygen saturation 99% on room air, weight 123 pounds. EKG not acute as below, CXR showed no acute cardiopulmonary process. CTA chest negative for PE with 9 mm small spiculated nodular density noted at the left lung apex as well as a circumscribed 2.8 cm fat density lesion along the right pericardial surface, possibly representing a lipoma. Labs showed BNP 483, troponin peaked at 0.09 and is down trending, CBC unremarkable x 3, Na 138, K+ 3.9-->3.7, glucose 136, BUN 16, SCr 0.80-->0.75, AST 127-->137, ALT 153-->162, alk phos 205-->209. In the ED patient  received Tylenol. Upon admission patient was placed on her home dose PO Lasix 20 mg daily and cardiology was asked to evaluate. No documented UOP for the admission to date. Currently, asymptomatic at rest, though noted increased SOB with ambulation around the nurses station.  Past Medical History:  Diagnosis Date  . Migraine   . Multiple sclerosis ([place])   . Neuromuscular disorder ([place])   . PMR (polymyalgia rheumatica) ([facility])     Past Surgical History:  Procedure Laterality Date  . BREAST EXCISIONAL BIOPSY    . BREAST SURGERY  1990  . COLONOSCOPY WITH PROPOFOL N/A 05/19/2015   Procedure: COLONOSCOPY WITH PROPOFOL;  Surgeon: Hulen Luster, MD;  Location: [facility];  Service: Gastroenterology;  Laterality: N/A;  . EYE SURGERY     1978 1st of 3 eye surgeries  . TENDON REPAIR       Home Meds: Prior to Admission medications   Medication Sig Start Date End Date Taking? Authorizing Provider  BIOTIN PO Take 1 tablet by mouth daily.   Yes [provider]  calcium carbonate (OS-CAL - DOSED IN MG OF ELEMENTAL CALCIUM) 1250 (500 Ca) MG tablet Take 1 tablet by mouth daily.   Yes [provider]  cholecalciferol (VITAMIN D) 1000 units tablet Take 2,000 Units by mouth daily.   Yes [provider]  cyanocobalamin 500 MCG tablet Take 500 mcg by mouth daily.   Yes [provider]  escitalopram (LEXAPRO) 5 MG tablet  TAKE 1 TABLET BY MOUTH EVERY DAY 09/11/17  Yes Pleas Koch, NP  furosemide (LASIX) 20 MG tablet TAKE 1 TABLET BY MOUTH EVERY DAY 02/27/18  Yes Pleas Koch, NP  Teriflunomide (AUBAGIO) 14 MG TABS Take 14 mg by mouth daily. 04/18/18  Yes Jaffe, Adam R, DO  zolpidem (AMBIEN) 10 MG tablet Take 0.5-1 tablets (5-10 mg total) by mouth at bedtime as needed for sleep. 03/21/18  Yes Pleas Koch, NP    Inpatient Medications: Scheduled Meds: . enoxaparin (LOVENOX) injection  40 mg Subcutaneous Q24H  . escitalopram  5 mg Oral Daily  . furosemide   20 mg Oral Daily   Continuous Infusions:  PRN Meds: acetaminophen **OR** acetaminophen, ondansetron **OR** ondansetron (ZOFRAN) IV, zolpidem  Allergies:  No Known Allergies  Social History:   Social History   Socioeconomic History  . Marital status: Married    Spouse name: Not on file  . Number of children: Not on file  . Years of education: Not on file  . Highest education level: Not on file  Occupational History  . Not on file  Social Needs  . Financial resource strain: Not on file  . Food insecurity:    Worry: Not on file    Inability: Not on file  . Transportation needs:    Medical: Not on file    Non-medical: Not on file  Tobacco Use  . Smoking status: Former Research scientist (life sciences)  . Smokeless tobacco: Never Used  Substance and Sexual Activity  . Alcohol use: Yes    Alcohol/week: 0.0 oz    Comment: 1 to 2 glass of wine at least 4 nights a week  . Drug use: No  . Sexual activity: Not on file  Lifestyle  . Physical activity:    Days per week: Not on file    Minutes per session: Not on file  . Stress: Not on file  Relationships  . Social connections:    Talks on phone: Not on file    Gets together: Not on file    Attends religious service: Not on file    Active member of club or organization: Not on file    Attends meetings of clubs or organizations: Not on file    Relationship status: Not on file  . Intimate partner violence:    Fear of current or ex partner: Not on file    Emotionally abused: Not on file    Physically abused: Not on file    Forced sexual activity: Not on file  Other Topics Concern  . Not on file  Social History Narrative   Married.   Moved here from [place] after 35 years   Enjoys arts and crafts. Taking a painting class.   Walking with her spouse.    Reading, shopping.     Family History:   Family History  Problem Relation Age of Onset  . Heart disease Mother   . Hypertension Mother   . Parkinson's disease Mother   . Parkinsonism Mother    . Cancer Maternal Grandmother        breast  . Breast cancer Maternal Grandmother   . Asthma Father     ROS:  Review of Systems  Constitutional: Positive for malaise/fatigue. Negative for chills, diaphoresis, fever and weight loss.  HENT: Negative for congestion.   Eyes: Negative for discharge and redness.  Respiratory: Positive for shortness of breath. Negative for cough, hemoptysis, sputum production and wheezing.   Cardiovascular: Positive for chest pain.  Negative for palpitations, orthopnea, claudication, leg swelling and PND.  Gastrointestinal: Negative for abdominal pain, blood in stool, heartburn, melena, nausea and vomiting.  Genitourinary: Negative for hematuria.  Musculoskeletal: Negative for falls and myalgias.  Skin: Negative for rash.  Neurological: Positive for weakness. Negative for dizziness, tingling, tremors, sensory change, speech change, focal weakness and loss of consciousness.  Endo/Heme/Allergies: Does not bruise/bleed easily.  Psychiatric/Behavioral: Negative for substance abuse. The patient is not nervous/anxious.   All other systems reviewed and are negative.     Physical Exam/Data:   Vitals:   04/23/18 2145 04/23/18 2244 04/24/18 0345 04/24/18 0500  BP: 105/71 115/69 (!) 96/54   Pulse: 63 66 69   Resp: 13 16 18    Temp:  98.5 F (36.9 C) 98.3 F (36.8 C)   TempSrc:  Oral Oral   SpO2: 98% 100% 96%   Weight:  123 lb (55.8 kg)  122 lb 9.2 oz (55.6 kg)  Height:  5' 6"  (1.676 m)     No intake or output data in the 24 hours ending 04/24/18 0755 Filed Weights   04/23/18 1713 04/23/18 2244 04/24/18 0500  Weight: 123 lb (55.8 kg) 123 lb (55.8 kg) 122 lb 9.2 oz (55.6 kg)   Body mass index is 19.78 kg/m.   Physical Exam: General: Well developed, well nourished, in no acute distress. Head: Normocephalic, atraumatic, sclera non-icteric, no xanthomas, nares without discharge.  Neck: Negative for carotid bruits. JVD not elevated. Lungs: Clear bilaterally  to auscultation without wheezes, rales, or rhonchi. Breathing is unlabored. Heart: RRR with S1 S2. No murmurs, rubs, or gallops appreciated. Abdomen: Soft, non-tender, non-distended with normoactive bowel sounds. No hepatomegaly. No rebound/guarding. No obvious abdominal masses. Msk:  Strength and tone appear normal for age. Extremities: No clubbing or cyanosis. Trace bilateral pretibial edema. Distal pedal pulses are 2+ and equal bilaterally. Neuro: Alert and oriented X 3. No facial asymmetry. No focal deficit. Moves all extremities spontaneously. Psych:  Responds to questions appropriately with a normal affect.   EKG:  The EKG was personally reviewed and demonstrates: NSR, 68 bpm, no acute st/t changes  Telemetry:  Telemetry was personally reviewed and demonstrates: NSR  Weights: Filed Weights   04/23/18 1713 04/23/18 2244 04/24/18 0500  Weight: 123 lb (55.8 kg) 123 lb (55.8 kg) 122 lb 9.2 oz (55.6 kg)    Relevant CV Studies: Echo pending  Laboratory Data:  Chemistry Recent Labs  Lab 04/23/18 1214 04/23/18 1744 04/24/18 0430  NA 138 137 142  K 3.9 3.7 3.7  CL 100 99 105  CO2 29 27 28   GLUCOSE 136* 105* 114*  BUN 16 17 14   CREATININE 0.80 0.69 0.75  CALCIUM 9.9 9.2 8.9  GFRNONAA  --  >60 >60  GFRAA  --  >60 >60  ANIONGAP  --  11 9    Recent Labs  Lab 04/23/18 1214 04/23/18 1744  PROT 7.5 7.6  ALBUMIN 4.1 3.8  AST 127* 137*  ALT 153* 162*  ALKPHOS 205* 209*  BILITOT 0.5 0.7   Hematology Recent Labs  Lab 04/23/18 1214 04/23/18 1744 04/24/18 0430  WBC 8.8 8.3 8.0  RBC 4.38 4.28 4.36  HGB 12.3 12.3 12.4  HCT 37.3 36.5 37.1  MCV 85.1 85.3 85.1  MCH  --  28.7 28.5  MCHC 33.1 33.6 33.5  RDW 13.7 13.9 13.7  PLT 207.0 209 200   Cardiac Enzymes Recent Labs  Lab 04/23/18 1744 04/23/18 2312 04/24/18 0430  TROPONINI 0.06* 0.09* 0.06*  No results for input(s): TROPIPOC in the last 168 hours.  BNP Recent Labs  Lab 04/23/18 1744  BNP 483.0*      DDimer  Recent Labs  Lab 04/23/18 1214  DDIMER 1.68*    Radiology/Studies:  Dg Chest 2 View  Result Date: 04/23/2018 IMPRESSION: No evidence of disease. Electronically Signed   By: Monte Fantasia M.D.   On: 04/23/2018 12:35   Ct Angio Chest Pe W/cm &/or Wo Cm  Result Date: 04/23/2018 IMPRESSION: 1. Negative for acute pulmonary embolus. 2. 9 mm small spiculated nodular density at the left lung apex. Consider one of the following in 3 months for both low-risk and high-risk individuals: (a) repeat chest CT, (b) follow-up PET-CT, or (c) tissue sampling. This recommendation follows the consensus statement: Guidelines for Management of Incidental Pulmonary Nodules Detected on CT Images: From the Fleischner Society 2017; Radiology 2017; 284:228-243. 3. Circumscribed 2.8 cm fat density lesion along the right pericardial surface, may reflect a lipoma Aortic Atherosclerosis (ICD10-I70.0). Electronically Signed   By: Donavan Foil M.D.   On: 04/23/2018 19:36    Assessment and Plan:   1. Chest pain with moderate risk for cardiac etiology/elevated troponin: -Currently, chest pain and SOB free -Troponin peaked at 0.09 and has down trended -Discussed ischemic options with the patient including Myoview and LHC. She prefers to proceed with Myoview testing initially over LHC -NPO -Schedule treadmill Myoview for this morning to evaluate for high-risk ischemia -Echo pending -ASA -Check lipid and A1c for further risk stratification   2. Dyspnea on exertion: -CTA negative for PE -Echo pending -This does not appear to be a CHF exacerbation given her BNP is only minimally elevated, CXR is without congestion, edema, or effusions, lungs are CTAB, JVD is not elevated, and her legs are only minimally swollen which is a chronic issue for her -I have advised her that she will need to have a follow up CT chest to evaluate the left apex lesion as an outpatient through her PCP  3. Hyperglycemia: -Check A1c as  above  4. Abnormal LFTs: -Not on a statin -Recommend dedicated imaging/workup, defer to IM/PCP   For questions or updates, please contact [place] Please consult [email or web address] for contact info under Cardiology/STEMI.   Signed, Christell Faith, PA-C [place] Pager: [phone] 04/24/2018, 7:55 AM

## 2018-04-24 NOTE — Progress Notes (Signed)
Patient was transferred from the ED following c/o SOB and DOE. On admission patient was A&O X4, denied being in pain or any discomfort, but stated that her leg and arms feels like rubber. Patient was oriented to the room and equipments. Care plan was reviewed with the patient.  . Patient rested for most of the night without any acute event. She kept NPO overnight per order.

## 2018-04-24 NOTE — Plan of Care (Signed)

## 2018-04-24 NOTE — H&P (View-Only) (Signed)
Patient presented for treadmill Myoview this afternoon. While on the treadmill, her BP dropped from 87 systolic to 70 systolic. Treadmill was stopped. Patient noted SOB with treadmill. I called Dr. Fletcher Anon and he came to assess the patient. We will plan for a LHC on 7/26. Patient is in agreement with this plan.

## 2018-04-24 NOTE — Progress Notes (Signed)
*  PRELIMINARY RESULTS* Echocardiogram 2D Echocardiogram has been performed.  Sherrie Sport 04/24/2018, 9:18 AM

## 2018-04-24 NOTE — Progress Notes (Signed)
[place] at [place] NAME: Morgan Herrera    MR#:  [id number]  DATE OF BIRTH:  1951-05-06  SUBJECTIVE:  CHIEF COMPLAINT:   Chief Complaint  Patient presents with  . Shortness of Breath  . Weakness  . Abnormal Lab  feels dyspneic mainly on exertion for last 1 week. Husband at bedside REVIEW OF SYSTEMS:  Review of Systems  Constitutional: Negative for chills, fever and weight loss.  HENT: Negative for nosebleeds and sore throat.   Eyes: Negative for blurred vision.  Respiratory: Positive for shortness of breath. Negative for cough and wheezing.   Cardiovascular: Negative for chest pain, orthopnea, leg swelling and PND.  Gastrointestinal: Negative for abdominal pain, constipation, diarrhea, heartburn, nausea and vomiting.  Genitourinary: Negative for dysuria and urgency.  Musculoskeletal: Negative for back pain.  Skin: Negative for rash.  Neurological: Negative for dizziness, speech change, focal weakness and headaches.  Endo/Heme/Allergies: Does not bruise/bleed easily.  Psychiatric/Behavioral: Negative for depression.    DRUG ALLERGIES:  No Known Allergies VITALS:  Blood pressure 113/68, pulse 68, temperature 99.8 F (37.7 C), temperature source Oral, resp. rate 14, height 5\' 6"  (1.676 m), weight 55.6 kg (122 lb 9.2 oz), SpO2 96 %. PHYSICAL EXAMINATION:  Physical Exam  Constitutional: She is oriented to person, place, and time.  HENT:  Head: Normocephalic and atraumatic.  Eyes: Pupils are equal, round, and reactive to light. Conjunctivae and EOM are normal.  Neck: Normal range of motion. Neck supple. No tracheal deviation present. No thyromegaly present.  Cardiovascular: Normal rate, regular rhythm and normal heart sounds.  Pulmonary/Chest: Effort normal and breath sounds normal. No respiratory distress. She has no wheezes. She exhibits no tenderness.  Abdominal: Soft. Bowel sounds are normal. She exhibits no distension. There  is no tenderness.  Musculoskeletal: Normal range of motion.  Neurological: She is alert and oriented to person, place, and time. No cranial nerve deficit.  Skin: Skin is warm and dry. No rash noted.   LABORATORY PANEL:  Female CBC Recent Labs  Lab 04/24/18 0430  WBC 8.0  HGB 12.4  HCT 37.1  PLT 200   ------------------------------------------------------------------------------------------------------------------ Chemistries  Recent Labs  Lab 04/23/18 1744 04/24/18 0430  NA 137 142  K 3.7 3.7  CL 99 105  CO2 27 28  GLUCOSE 105* 114*  BUN 17 14  CREATININE 0.69 0.75  CALCIUM 9.2 8.9  AST 137*  --   ALT 162*  --   ALKPHOS 209*  --   BILITOT 0.7  --    RADIOLOGY:  Nm Myocar Multi W/spect W/wall Motion / Ef  Result Date: 04/24/2018 Rest only images with anterior wall perfusion defect. The  patient was supposed to have a treadmill nuclear stress test but had hypotensive response with exercise and thus was not injected with stress dose.  The test was canceled and cardiac catheterization was recommended.  ASSESSMENT AND PLAN:    * Unstable Angina -couldn't tolerate stress test as BP dropped and became SOB -cath planned for tomorrow - echo shows normal LV function  * Dyspnea on exertion: of 1 week duration - unknown etio as of now, CHF ruled out -CTA negative for PE - has small left apical nodule and right pericardial lipoma which may need outpt f/up -Echo shows normal LV function, no major patho  * Abnormal LFTs: trending up - likely due to MS medications (d/w Pharmacist) - recommend holding them for now till f/up with Neuro as an outpt  *  Depression -home dose antidepressant  * elevated sugars: HbA1c 5.9 - dm ruled out     All the records are reviewed and case discussed with Care Management/Social Worker. Management plans discussed with the patient, family (husband at bedside) and they are in agreement.  CODE STATUS: Full Code  TOTAL TIME TAKING CARE OF  THIS PATIENT: 25 minutes.   More than 50% of the time was spent in counseling/coordination of care: YES  POSSIBLE D/C IN 2-3 DAYS, DEPENDING ON CLINICAL CONDITION. And cardiac eval   Max Sane M.D on 04/24/2018 at 7:28 PM  Between 7am to 6pm - Pager - [phone]  After 6pm go to [email or web address] - Proofreader  [facility]  Office  [phone]  CC: Primary care physician; Pleas Koch, NP  Note: This dictation was prepared with Dragon dictation along with smaller phrase technology. Any transcriptional errors that result from this process are unintentional.

## 2018-04-24 NOTE — Progress Notes (Signed)
Patient presented for treadmill Myoview this afternoon. While on the treadmill, her BP dropped from 87 systolic to 70 systolic. Treadmill was stopped. Patient noted SOB with treadmill. I called Dr. Fletcher Anon and he came to assess the patient. We will plan for a LHC on 7/26. Patient is in agreement with this plan.

## 2018-04-25 ENCOUNTER — Encounter: Payer: Self-pay | Admitting: Cardiovascular Disease

## 2018-04-25 ENCOUNTER — Telehealth: Payer: Self-pay | Admitting: Cardiovascular Disease

## 2018-04-25 ENCOUNTER — Telehealth: Payer: Self-pay | Admitting: Neurology

## 2018-04-25 ENCOUNTER — Encounter
Admission: EM | Disposition: A | Discharge status: Home / Self Care | Payer: Self-pay | Source: Home / Self Care | Attending: Internal Medicine

## 2018-04-25 DIAGNOSIS — G35 Multiple sclerosis: Secondary | ICD-10-CM

## 2018-04-25 DIAGNOSIS — Z79899 Other long term (current) drug therapy: Secondary | ICD-10-CM

## 2018-04-25 DIAGNOSIS — I200 Unstable angina: Secondary | ICD-10-CM

## 2018-04-25 HISTORY — PX: LEFT HEART CATH AND CORONARY ANGIOGRAPHY: CATH118249

## 2018-04-25 LAB — CBC
HCT: 32.5 % — ABNORMAL LOW (ref 35.0–47.0)
HEMOGLOBIN: 11.1 g/dL — AB (ref 12.0–16.0)
MCH: 28.5 pg (ref 26.0–34.0)
MCHC: 34 g/dL (ref 32.0–36.0)
MCV: 83.9 fL (ref 80.0–100.0)
Platelets: 212 10*3/uL (ref 150–440)
RBC: 3.87 MIL/uL (ref 3.80–5.20)
RDW: 13.7 % (ref 11.5–14.5)
WBC: 6.9 10*3/uL (ref 3.6–11.0)

## 2018-04-25 LAB — BASIC METABOLIC PANEL
Anion gap: 6 (ref 5–15)
BUN: 15 mg/dL (ref 8–23)
CHLORIDE: 107 mmol/L (ref 98–111)
CO2: 27 mmol/L (ref 22–32)
Calcium: 8.6 mg/dL — ABNORMAL LOW (ref 8.9–10.3)
Creatinine, Ser: 0.69 mg/dL (ref 0.44–1.00)
GFR calc non Af Amer: >60 mL/min (ref >60)
Glucose, Bld: 108 mg/dL — ABNORMAL HIGH (ref 70–99)
POTASSIUM: 4 mmol/L (ref 3.5–5.1)
Sodium: 140 mmol/L (ref 135–145)

## 2018-04-25 LAB — HEPATIC FUNCTION PANEL
ALBUMIN: 3 g/dL — AB (ref 3.5–5.0)
ALT: 120 U/L — AB (ref 0–44)
AST: 82 U/L — AB (ref 15–41)
Alkaline Phosphatase: 201 U/L — ABNORMAL HIGH (ref 38–126)
Bilirubin, Direct: <0.1 mg/dL (ref 0.0–0.2)
TOTAL PROTEIN: 6.2 g/dL — AB (ref 6.5–8.1)
Total Bilirubin: 0.5 mg/dL (ref 0.3–1.2)

## 2018-04-25 LAB — HIV ANTIBODY (ROUTINE TESTING W REFLEX): HIV Screen 4th Generation wRfx: NONREACTIVE

## 2018-04-25 SURGERY — LEFT HEART CATH AND CORONARY ANGIOGRAPHY
Anesthesia: Moderate Sedation

## 2018-04-25 MED ORDER — FUROSEMIDE 40 MG PO TABS: 40.0000 mg | ORAL_TABLET | Freq: Every day | ORAL | Status: DC

## 2018-04-25 MED ORDER — LIDOCAINE HCL (PF) 1 % IJ SOLN
INTRAMUSCULAR | Status: AC
  Filled 2018-04-25: qty 30

## 2018-04-25 MED ORDER — MIDAZOLAM HCL 2 MG/2ML IJ SOLN
INTRAMUSCULAR | Status: DC | PRN
  Administered 2018-04-25: 1 mg via INTRAVENOUS

## 2018-04-25 MED ORDER — SODIUM CHLORIDE 0.9 % IV SOLN
INTRAVENOUS | Status: AC
  Administered 2018-04-25: 13:00:00 via INTRAVENOUS

## 2018-04-25 MED ORDER — MIDAZOLAM HCL 2 MG/2ML IJ SOLN
INTRAMUSCULAR | Status: AC
  Filled 2018-04-25: qty 2

## 2018-04-25 MED ORDER — HEPARIN (PORCINE) IN NACL 1000-0.9 UT/500ML-% IV SOLN
INTRAVENOUS | Status: AC
  Filled 2018-04-25: qty 500

## 2018-04-25 MED ORDER — SODIUM CHLORIDE 0.9 % IV SOLN: 250.0000 mL | INTRAVENOUS | Status: DC | PRN

## 2018-04-25 MED ORDER — SODIUM CHLORIDE 0.9% FLUSH: 3.0000 mL | INTRAVENOUS | Status: DC | PRN

## 2018-04-25 MED ORDER — FENTANYL CITRATE (PF) 100 MCG/2ML IJ SOLN
INTRAMUSCULAR | Status: AC
  Filled 2018-04-25: qty 2

## 2018-04-25 MED ORDER — ISOSORBIDE MONONITRATE ER 30 MG PO TB24: 15.0000 mg | ORAL_TABLET | Freq: Every day | ORAL | 0 refills | Status: DC

## 2018-04-25 MED ORDER — VERAPAMIL HCL 2.5 MG/ML IV SOLN
INTRAVENOUS | Status: AC
  Filled 2018-04-25: qty 2

## 2018-04-25 MED ORDER — HEPARIN SODIUM (PORCINE) 1000 UNIT/ML IJ SOLN
INTRAMUSCULAR | Status: AC
  Filled 2018-04-25: qty 1

## 2018-04-25 MED ORDER — HEPARIN SODIUM (PORCINE) 1000 UNIT/ML IJ SOLN
INTRAMUSCULAR | Status: DC | PRN
  Administered 2018-04-25: 3000 [IU] via INTRAVENOUS

## 2018-04-25 MED ORDER — ISOSORBIDE MONONITRATE ER 30 MG PO TB24
15.0000 mg | ORAL_TABLET | Freq: Every day | ORAL | Status: DC
  Administered 2018-04-25: 15 mg via ORAL
  Filled 2018-04-25: qty 1

## 2018-04-25 MED ORDER — FENTANYL CITRATE (PF) 100 MCG/2ML IJ SOLN
INTRAMUSCULAR | Status: DC | PRN
  Administered 2018-04-25: 25 ug via INTRAVENOUS

## 2018-04-25 MED ORDER — SODIUM CHLORIDE 0.9% FLUSH: 3.0000 mL | Freq: Two times a day (BID) | INTRAVENOUS | Status: DC

## 2018-04-25 MED ORDER — VERAPAMIL HCL 2.5 MG/ML IV SOLN
INTRAVENOUS | Status: DC | PRN
  Administered 2018-04-25: 2.5 mg via INTRA_ARTERIAL

## 2018-04-25 SURGICAL SUPPLY — 8 items
CATH INFINITI 5FR ANG PIGTAIL (CATHETERS) ×3 IMPLANT
CATH INFINITI 5FR JK (CATHETERS) ×3 IMPLANT
DEVICE RAD TR BAND REGULAR (VASCULAR PRODUCTS) ×3 IMPLANT
KIT MANI 3VAL PERCEP (MISCELLANEOUS) ×3 IMPLANT
NEEDLE PERC 21GX4CM (NEEDLE) ×3 IMPLANT
PACK CARDIAC CATH (CUSTOM PROCEDURE TRAY) ×3 IMPLANT
SHEATH RAIN RADIAL 21G 6FR (SHEATH) ×3 IMPLANT
WIRE ROSEN-J .035X260CM (WIRE) ×3 IMPLANT

## 2018-04-25 NOTE — Telephone Encounter (Signed)
Patient currently admitted. TCM call- Monday 04/28/18.

## 2018-04-25 NOTE — Telephone Encounter (Signed)
TCM scheduled to see Morgan Herrera on 08/15   Needs 1-2w fu

## 2018-04-25 NOTE — Discharge Instructions (Signed)
Heart healthy diet

## 2018-04-25 NOTE — Care Management (Signed)
Anticipated discharge today after having normal cath but patient was experiencing hypotension post cath.  No discharge needs identified by members of the care team

## 2018-04-25 NOTE — Telephone Encounter (Signed)
-----   Message from Wellington Hampshire, MD sent at 04/25/2018 12:51 PM EDT ----- TCM follow up in 1-2 weeks with Thurmond Butts.

## 2018-04-25 NOTE — Discharge Summary (Signed)
[facility] NAME: Morgan Herrera    MR#:  [id number]  DATE OF BIRTH:  07-Jun-1951  DATE OF ADMISSION:  04/23/2018   ADMITTING PHYSICIAN: Lance Coon, MD  DATE OF DISCHARGE: 04/25/2018  PRIMARY CARE PHYSICIAN: Pleas Koch, NP   ADMISSION DIAGNOSIS:  Shortness of breath [R06.02] DISCHARGE DIAGNOSIS:  Principal Problem:   Acute CHF (congestive heart failure) (HCC) Active Problems:   Multiple sclerosis (HCC)   Depression   PMR (polymyalgia rheumatica) (HCC)   Unstable angina ([place])  SECONDARY DIAGNOSIS:   Past Medical History:  Diagnosis Date  . Migraine   . Multiple sclerosis ([place])   . Neuromuscular disorder ([place])   . PMR (polymyalgia rheumatica) ([place])    HOSPITAL COURSE:   *Atypical chest pain. -couldn't tolerate stress test as BP dropped and became SOB Cardiac cath is unremarkable. - echo shows normal LV function  * Acute mild diastolic CHF. -CTA negative for PE - has small left apical nodule and right pericardial lipoma which may need outpt f/up -Echo shows normal LV function. Recommend continuing small dose furosemide and imdur 15 mg po daily per Dr. Fletcher Anon.  * Abnormal LFTs: better/ - likely due to MS medications (d/w Pharmacist) - recommend holding them for now till f/up with Neuro as an outpt.  *Depression -home dose antidepressant  * elevated sugars: HbA1c 5.9 - dm ruled out  Hyperlipidemia.  LDL 125.  Diet control.   I discussed with Dr. Fletcher Anon. DISCHARGE CONDITIONS:  Stable, discharge to home today. CONSULTS OBTAINED:  Treatment Team:  Wellington Hampshire, MD Demetrios Loll, MD DRUG ALLERGIES:  No Known Allergies DISCHARGE MEDICATIONS:   Allergies as of 04/25/2018   No Known Allergies     Medication List    STOP taking these medications   Teriflunomide 14 MG Tabs Commonly known as:  AUBAGIO     TAKE these medications   BIOTIN PO Take 1 tablet by mouth daily.   calcium  carbonate 1250 (500 Ca) MG tablet Commonly known as:  OS-CAL - dosed in mg of elemental calcium Take 1 tablet by mouth daily.   cholecalciferol 1000 units tablet Commonly known as:  VITAMIN D Take 2,000 Units by mouth daily.   escitalopram 5 MG tablet Commonly known as:  LEXAPRO TAKE 1 TABLET BY MOUTH EVERY DAY   furosemide 20 MG tablet Commonly known as:  LASIX TAKE 1 TABLET BY MOUTH EVERY DAY   isosorbide mononitrate 30 MG 24 hr tablet Commonly known as:  IMDUR Take 0.5 tablets (15 mg total) by mouth daily.   vitamin B-12 500 MCG tablet Commonly known as:  CYANOCOBALAMIN Take 500 mcg by mouth daily.   zolpidem 10 MG tablet Commonly known as:  AMBIEN Take 0.5-1 tablets (5-10 mg total) by mouth at bedtime as needed for sleep.        DISCHARGE INSTRUCTIONS:  See AVS.  If you experience worsening of your admission symptoms, develop shortness of breath, life threatening emergency, suicidal or homicidal thoughts you must seek medical attention immediately by calling 911 or calling your MD immediately  if symptoms less severe.  You Must read complete instructions/literature along with all the possible adverse reactions/side effects for all the Medicines you take and that have been prescribed to you. Take any new Medicines after you have completely understood and accpet all the possible adverse reactions/side effects.   Please note  You were cared for by a hospitalist during your hospital stay. If  you have any questions about your discharge medications or the care you received while you were in the hospital after you are discharged, you can call the unit and asked to speak with the hospitalist on call if the hospitalist that took care of you is not available. Once you are discharged, your primary care physician will handle any further medical issues. Please note that NO REFILLS for any discharge medications will be authorized once you are discharged, as it is imperative that you  return to your primary care physician (or establish a relationship with a primary care physician if you do not have one) for your aftercare needs so that they can reassess your need for medications and monitor your lab values.    On the day of Discharge:  VITAL SIGNS:  Blood pressure 99/68, pulse 61, temperature 98.7 F (37.1 C), temperature source Oral, resp. rate 14, height 5\' 6"  (1.676 m), weight 122 lb 8 oz (55.6 kg), SpO2 95 %. PHYSICAL EXAMINATION:  GENERAL:  67 y.o.-year-old patient lying in the bed with no acute distress.  EYES: Pupils equal, round, reactive to light and accommodation. No scleral icterus. Extraocular muscles intact.  HEENT: Head atraumatic, normocephalic. Oropharynx and nasopharynx clear.  NECK:  Supple, no jugular venous distention. No thyroid enlargement, no tenderness.  LUNGS: Normal breath sounds bilaterally, no wheezing, rales,rhonchi or crepitation. No use of accessory muscles of respiration.  CARDIOVASCULAR: S1, S2 normal. No murmurs, rubs, or gallops.  ABDOMEN: Soft, non-tender, non-distended. Bowel sounds present. No organomegaly or mass.  EXTREMITIES: No pedal edema, cyanosis, or clubbing.  NEUROLOGIC: Cranial nerves II through XII are intact. Muscle strength 5/5 in all extremities. Sensation intact. Gait not checked.  PSYCHIATRIC: The patient is alert and oriented x 3.  SKIN: No obvious rash, lesion, or ulcer.  DATA REVIEW:   CBC Recent Labs  Lab 04/25/18 0653  WBC 6.9  HGB 11.1*  HCT 32.5*  PLT 212    Chemistries  Recent Labs  Lab 04/25/18 0653  NA 140  K 4.0  CL 107  CO2 27  GLUCOSE 108*  BUN 15  CREATININE 0.69  CALCIUM 8.6*  AST 82*  ALT 120*  ALKPHOS 201*  BILITOT 0.5     Microbiology Results  No results found for this or any previous visit.  RADIOLOGY:  No results found.   Management plans discussed with the patient, her husband and they are in agreement.  CODE STATUS: Full Code   TOTAL TIME TAKING CARE OF THIS  PATIENT: 35 minutes.    Demetrios Loll M.D on 04/25/2018 at 4:10 PM  Between 7am to 6pm - Pager - [phone]  After 6pm go to [email or web address] - Proofreader  [facility]  Office  [phone]  CC: Primary care physician; Pleas Koch, NP   Note: This dictation was prepared with Dragon dictation along with smaller phrase technology. Any transcriptional errors that result from this process are unintentional.

## 2018-04-25 NOTE — Telephone Encounter (Signed)
Patient wants to talk to someone about her medication she had another Dr  Tell her to stop taking it. She left a message on the VM

## 2018-04-25 NOTE — Interval H&P Note (Signed)
Cath Lab Visit (complete for each Cath Lab visit)  Clinical Evaluation Leading to the Procedure:   ACS: yes (unstable angina)  Non-ACS:  n/a    History and Physical Interval Note:  04/25/2018 10:12 AM  Morgan Herrera  has presented today for surgery, with the diagnosis of unstable angina  The various methods of treatment have been discussed with the patient and family. After consideration of risks, benefits and other options for treatment, the patient has consented to  Procedure(s): LEFT HEART CATH AND CORONARY ANGIOGRAPHY (N/A) as a surgical intervention .  The patient's history has been reviewed, patient examined, no change in status, stable for surgery.  I have reviewed the patient's chart and labs.  Questions were answered to the patient's satisfaction.     Kathlyn Sacramento

## 2018-04-25 NOTE — Care Management Important Message (Signed)
Left copy of signed IM in patient's room (out for procedure). 

## 2018-04-25 NOTE — Progress Notes (Signed)
Discharged to home with husband.  Cath site is clean and dry, radial pulse is palpable and moderate strong.

## 2018-04-28 ENCOUNTER — Telehealth: Payer: Self-pay

## 2018-04-28 NOTE — Telephone Encounter (Signed)
I would remain on Aubagio and repeat hepatic panel in one month.

## 2018-04-28 NOTE — Telephone Encounter (Signed)
Patient had been in [facility] and was instructed to stop taking her Medication right away. She is asking Should she have new blood work taken. Her # is  [phone]. Please Call. Thanks

## 2018-04-28 NOTE — Telephone Encounter (Signed)
Left detailed message (okay per DPR) on home VM.  Patient is to call back for TCM.  She has an appointment with Allie Bossier, NP on 04/30/18.

## 2018-04-28 NOTE — Telephone Encounter (Signed)
Patient calling us back  Please call back

## 2018-04-28 NOTE — Telephone Encounter (Signed)
Called and spoke with Pt, advised her to remain on Aubagio and have LFTs checked in 1 month. She will go to [place]

## 2018-04-28 NOTE — Telephone Encounter (Signed)
No answer. Left message to call back.   

## 2018-04-28 NOTE — Telephone Encounter (Signed)
I attempted to call the patient's "preferred" contact # x 2 tries- fast busy signal.  I left a message at her home # to please call the office back.

## 2018-04-28 NOTE — Telephone Encounter (Signed)
Called and spoke with Pt, she was advised to d/c MS meds immediately due to liver enzymes. She has not stopped, wanted to talk with our office first.

## 2018-04-29 NOTE — Telephone Encounter (Signed)
Noted  

## 2018-04-29 NOTE — Telephone Encounter (Signed)
Lm requesting return call to complete TCM and confirm hosp f/u appt  

## 2018-04-29 NOTE — Telephone Encounter (Signed)
Transition Care Management Follow-up Telephone Call   Date discharged? 04/25/18   How have you been since you were released from the hospital?  Pt is feeling about the same; SOB upon exertion, not a lot of energy.   Do you understand why you were in the hospital? yes   Do you understand the discharge instructions? yes   Where were you discharged to?  home     Items Reviewed:  Medications reviewed:yes; one of meds caused h/a and pt has not been taking; pt not know name of med.  Allergies reviewed: yes  Dietary changes reviewed: N/A  Referrals reviewed: pt has appt 05/15/18 at 2:30 with Ignacia Bayley   Functional Questionnaire:   Activities of Daily Living (ADLs):   She states they are independent in the following: ambulation, bathing and hygiene, feeding, continence, grooming, toileting and dressing States they require assistance with the following: nothing   Any transportation issues/concerns?: no   Any patient concerns? no   Confirmed importance and date/time of follow-up visits scheduled yes  Provider Appointment booked with Allie Bossier NP on 04/30/18 at 11:40  Confirmed with patient if condition begins to worsen call PCP or go to the ER.  Patient was given the office number and encouraged to call back with question or concerns.  : yes

## 2018-04-30 ENCOUNTER — Encounter: Payer: Self-pay | Admitting: Primary Care

## 2018-04-30 ENCOUNTER — Encounter (INDEPENDENT_AMBULATORY_CARE_PROVIDER_SITE_OTHER): Payer: Self-pay

## 2018-04-30 ENCOUNTER — Ambulatory Visit (INDEPENDENT_AMBULATORY_CARE_PROVIDER_SITE_OTHER): Payer: PPO | Admitting: Primary Care

## 2018-04-30 VITALS — BP 106/64 | HR 58 | Temp 98.1°F | Ht 66.0 in | Wt 119.5 lb

## 2018-04-30 DIAGNOSIS — R945 Abnormal results of liver function studies: Secondary | ICD-10-CM

## 2018-04-30 DIAGNOSIS — R911 Solitary pulmonary nodule: Secondary | ICD-10-CM | POA: Diagnosis not present

## 2018-04-30 DIAGNOSIS — R7989 Other specified abnormal findings of blood chemistry: Secondary | ICD-10-CM

## 2018-04-30 DIAGNOSIS — R0602 Shortness of breath: Secondary | ICD-10-CM

## 2018-04-30 DIAGNOSIS — G35 Multiple sclerosis: Secondary | ICD-10-CM | POA: Diagnosis not present

## 2018-04-30 DIAGNOSIS — R0609 Other forms of dyspnea: Secondary | ICD-10-CM | POA: Diagnosis not present

## 2018-04-30 DIAGNOSIS — R5383 Other fatigue: Secondary | ICD-10-CM | POA: Diagnosis not present

## 2018-04-30 DIAGNOSIS — Z09 Encounter for follow-up examination after completed treatment for conditions other than malignant neoplasm: Secondary | ICD-10-CM

## 2018-04-30 DIAGNOSIS — R519 Headache, unspecified: Secondary | ICD-10-CM

## 2018-04-30 DIAGNOSIS — R51 Headache: Secondary | ICD-10-CM

## 2018-04-30 HISTORY — DX: Shortness of breath: R06.02

## 2018-04-30 MED ORDER — METHYLPREDNISOLONE ACETATE 40 MG/ML IJ SUSP
80.0000 mg | Freq: Once | INTRAMUSCULAR | Status: AC
  Administered 2018-04-30: 80 mg via INTRAMUSCULAR

## 2018-04-30 NOTE — Assessment & Plan Note (Signed)
Following with neurology. Recent hospital stay with normal cardiac work up, consider MS to be contributing to symptoms?

## 2018-04-30 NOTE — Patient Instructions (Addendum)
Stop by the front desk and speak with either Rosaria Ferries regarding your referral to pulmonology.  You were provided with an injection for steroids today for the headache.   Schedule a lab only appointment for Friday this week.   Please update me once you've met with the cardiologist.   It was a pleasure to see you today!

## 2018-04-30 NOTE — Assessment & Plan Note (Signed)
Overall improved since hospital stay but continues.  This is an odd case as she's is overall a healthy patient who is very active without difficulty otherwise.   Seems as though cardiac work up has been unremarkable thus far, so unsure if this is the etiology of her symptoms. Will have her follow up with cards in the outpatient setting as scheduled.   Referral to pulmonology for PFT's. Will also need to repeat CT chest in 3 months given nodule noted on recent imaging. Will send patient a reminder about this. IM Depo Medrol provided today for nagging headache.  Unsure if symptoms could be MS flare, she has notified her neurologist regarding symptoms and states that he doesn't seem concerned (regarding MS).   Repeat BNP, LFT's. Add TSH, Lyme panel.   All hospital labs, imaging, notes reviewed.

## 2018-04-30 NOTE — Progress Notes (Signed)
Subjective:    Patient ID: Morgan Herrera, female    DOB: 06/27/1951, 67 y.o.   MRN: [id number]  HPI  Morgan Herrera is a 67 year old female who presents today for TCM hospital follow up.  Evaluated in our clinic per Tor Netters on 04/23/18 with a 1 week history of of exertional dyspnea. She underwent lab testing which revealed an elevated D-Dimer so she was sent to the emergency department for further testing.  During her stay in the ED her ECG was unremarkable. Her Troponin was mildly elevated, LFT's were elevated, and BNP was in the 400's. She was admitted for further evaluation. She underwent CT of her chest which showed a "66mm small spiculated nodular density" for which will need re-imaging with CT chest in 3 months. No evidence of pulmonary embolism.    During her hospital stay she underwent stress test which was stopped prematurely due to significant drops in blood pressure. She then underwent  echocardiogram which revealed LV EF of 55-60%, normal wall motion, moderate mitral valve regurgitation, normal pulmonary pressures. She also underwent cardiac catheterization which was unremarkable. There was no clear cause for her exertional dyspnea so she was advised to continue furosemide, start Imdur 15 mg daily, and follow up with cardiology in the outpatient setting.   Since her discharge home she's compliant to her 20 mg of furosemide daily for which she's been taking for years. She has never taken Imdur. She continues to experience exertional shortness of breath with mild exertion, walking short distances. She continues to feel fatigued and has little appetite.  She's had a headache since hospital discharge. Overall she's feeling better but not great.   She is scheduled to see the the heart failure clinic in one week and then cardiology the following week. She has been getting an intermittent burning sensations down her bilateral upper extremities, discomfort on occasion. She's checking  her BP which is running low 100's/70's. Her neurologist is aware of her recent admission and plans to see her in October this year as scheduled. She has been outdoors all summer long working in the garden and yard, doesn't every recall a tick bite.  Review of Systems  Constitutional: Positive for fatigue. Negative for fever.  HENT: Negative for congestion.   Eyes: Negative for visual disturbance.  Respiratory: Positive for shortness of breath. Negative for cough and wheezing.   Cardiovascular: Negative for chest pain, palpitations and leg swelling.  Neurological: Positive for dizziness, weakness and headaches.       Past Medical History:  Diagnosis Date  . Migraine   . Multiple sclerosis ([place])   . Neuromuscular disorder ([place])   . PMR (polymyalgia rheumatica) (HCC)      Social History   Socioeconomic History  . Marital status: Married    Spouse name: Not on file  . Number of children: Not on file  . Years of education: Not on file  . Highest education level: Not on file  Occupational History  . Not on file  Social Needs  . Financial resource strain: Not on file  . Food insecurity:    Worry: Not on file    Inability: Not on file  . Transportation needs:    Medical: Not on file    Non-medical: Not on file  Tobacco Use  . Smoking status: Former Research scientist (life sciences)  . Smokeless tobacco: Never Used  Substance and Sexual Activity  . Alcohol use: Yes    Alcohol/week: 0.0 oz    Comment: 1  to 2 glass of wine at least 4 nights a week  . Drug use: No  . Sexual activity: Not on file  Lifestyle  . Physical activity:    Days per week: Not on file    Minutes per session: Not on file  . Stress: Not on file  Relationships  . Social connections:    Talks on phone: Not on file    Gets together: Not on file    Attends religious service: Not on file    Active member of club or organization: Not on file    Attends meetings of clubs or organizations: Not on file    Relationship status: Not on  file  . Intimate partner violence:    Fear of current or ex partner: Not on file    Emotionally abused: Not on file    Physically abused: Not on file    Forced sexual activity: Not on file  Other Topics Concern  . Not on file  Social History Narrative   Married.   Moved here from [place] after 35 years   Enjoys arts and crafts. Taking a painting class.   Walking with her spouse.    Reading, shopping.    Past Surgical History:  Procedure Laterality Date  . BREAST EXCISIONAL BIOPSY    . BREAST SURGERY  1990  . COLONOSCOPY WITH PROPOFOL N/A 05/19/2015   Procedure: COLONOSCOPY WITH PROPOFOL;  Surgeon: Hulen Luster, MD;  [facility] ENDOSCOPY;  Service: Gastroenterology;  Laterality: N/A;  . EYE SURGERY     1978 1st of 3 eye surgeries  . LEFT HEART CATH AND CORONARY ANGIOGRAPHY N/A 04/25/2018   Procedure: LEFT HEART CATH AND CORONARY ANGIOGRAPHY;  Surgeon: Wellington Hampshire, MD;  Location: [facility];  Service: Cardiovascular;  Laterality: N/A;  . TENDON REPAIR      Family History  Problem Relation Age of Onset  . Heart disease Mother   . Hypertension Mother   . Parkinson's disease Mother   . Parkinsonism Mother   . Cancer Maternal Grandmother        breast  . Breast cancer Maternal Grandmother   . Asthma Father     No Known Allergies  Current Outpatient Medications on File Prior to Visit  Medication Sig Dispense Refill  . BIOTIN PO Take 1 tablet by mouth daily.    . calcium carbonate (OS-CAL - DOSED IN MG OF ELEMENTAL CALCIUM) 1250 (500 Ca) MG tablet Take 1 tablet by mouth daily.    . cholecalciferol (VITAMIN D) 1000 units tablet Take 2,000 Units by mouth daily.    . cyanocobalamin 500 MCG tablet Take 500 mcg by mouth daily.    Marland Kitchen escitalopram (LEXAPRO) 5 MG tablet TAKE 1 TABLET BY MOUTH EVERY DAY 90 tablet 1  . furosemide (LASIX) 20 MG tablet TAKE 1 TABLET BY MOUTH EVERY DAY 90 tablet 0  . isosorbide mononitrate (IMDUR) 30 MG 24 hr tablet Take 0.5 tablets (15  mg total) by mouth daily. 30 tablet 0  . zolpidem (AMBIEN) 10 MG tablet Take 0.5-1 tablets (5-10 mg total) by mouth at bedtime as needed for sleep. 90 tablet 0   No current facility-administered medications on file prior to visit.     BP 106/64   Pulse (!) 58   Temp 98.1 F (36.7 C) (Oral)   Ht 5\' 6"  (1.676 m)   Wt 119 lb 8 oz (54.2 kg)   SpO2 98%   BMI 19.29 kg/m  Objective:   Physical Exam  Constitutional: She is oriented to person, place, and time. She appears well-nourished.  Neck: Neck supple.  Cardiovascular: Normal rate, regular rhythm and normal heart sounds.  Respiratory: Effort normal and breath sounds normal. She has no wheezes.  Musculoskeletal:  Grips equal but weak bilaterally. Bilateral lower and upper extremities weaker but equal.  Neurological: She is alert and oriented to person, place, and time.  Skin: Skin is warm and dry.  Psychiatric: She has a normal mood and affect.           Assessment & Plan:

## 2018-04-30 NOTE — Telephone Encounter (Signed)
Patient returning call denies questions or concerns except for not knowing what is causing her problem that lead her to the hospital.  Patient advised she can  call back anytime  with any concerns or questions.  She is aware we have there scheduled with Sharolyn Douglas on 8/15.

## 2018-05-01 ENCOUNTER — Ambulatory Visit: Payer: PPO | Admitting: Podiatry

## 2018-05-01 NOTE — Telephone Encounter (Signed)
No answer. Left message to call back.   

## 2018-05-02 ENCOUNTER — Other Ambulatory Visit: Payer: Self-pay

## 2018-05-02 ENCOUNTER — Other Ambulatory Visit (INDEPENDENT_AMBULATORY_CARE_PROVIDER_SITE_OTHER): Payer: PPO

## 2018-05-02 DIAGNOSIS — R0609 Other forms of dyspnea: Secondary | ICD-10-CM

## 2018-05-02 DIAGNOSIS — R5383 Other fatigue: Secondary | ICD-10-CM

## 2018-05-02 DIAGNOSIS — R945 Abnormal results of liver function studies: Secondary | ICD-10-CM | POA: Diagnosis not present

## 2018-05-02 DIAGNOSIS — R7989 Other specified abnormal findings of blood chemistry: Secondary | ICD-10-CM

## 2018-05-02 LAB — HEPATIC FUNCTION PANEL
ALBUMIN: 4.4 g/dL (ref 3.5–5.2)
ALK PHOS: 258 U/L — AB (ref 39–117)
ALT: 75 U/L — ABNORMAL HIGH (ref 0–35)
AST: 30 U/L (ref 0–37)
BILIRUBIN DIRECT: 0.1 mg/dL (ref 0.0–0.3)
Total Bilirubin: 0.5 mg/dL (ref 0.2–1.2)
Total Protein: 7.4 g/dL (ref 6.0–8.3)

## 2018-05-02 LAB — BRAIN NATRIURETIC PEPTIDE: PRO B NATRI PEPTIDE: 161 pg/mL — AB (ref 0.0–100.0)

## 2018-05-02 LAB — TSH: TSH: 2.22 u[IU]/mL (ref 0.35–4.50)

## 2018-05-02 NOTE — Patient Outreach (Signed)
[name]) Care Management  05/02/2018  Morgan Herrera Sep 29, 1951 [id number]   EMMI- General Discharge RED ON EMMI ALERT Day # 4 Date: 05/01/18 Red Alert Reason: Lost interest in things? yes   Outreach attempt # 1 No answer.  HIPAA compliant voice message left.     Plan: RN CM will send letter and attempt patient again within 4 business days.    Jone Baseman, RN, MSN [facility] Care Management Care Management Coordinator Direct Line [phone] Toll Free: [phone]  Fax: [phone]

## 2018-05-04 ENCOUNTER — Other Ambulatory Visit: Payer: Self-pay | Admitting: Podiatry

## 2018-05-05 ENCOUNTER — Other Ambulatory Visit: Payer: Self-pay

## 2018-05-05 NOTE — Patient Outreach (Signed)
[facility]) Care Management  05/05/2018  Morgan Herrera November 10, 1950 [id number]   EMMI- General Discharge RED ON EMMI ALERT Day # 4 Date:05/01/18 Red Alert Reason: Lost interest in things? yes  Outreach attempt: spoke with patient.  She is able to verify HIPAA.  Discussed the reason for the referral.  Patient states that she is fine and was a bit annoyed by the call.  Patient has follow up appointment and medications.  Patient declines any needs for [facility] services at this time.  Plan: RN CM will close case at this time.    Morgan Baseman, RN, MSN [name] Care Management Coordinator Direct Line [phone] Cell [phone] Toll Free: [phone]  Fax: [phone]

## 2018-05-06 ENCOUNTER — Ambulatory Visit: Payer: Self-pay

## 2018-05-06 LAB — B. BURGDORFI ANTIBODIES BY WB
B burgdorferi IgG Abs (IB): NEGATIVE
B burgdorferi IgM Abs (IB): NEGATIVE
LYME DISEASE 23 KD IGM: NONREACTIVE
LYME DISEASE 39 KD IGM: NONREACTIVE
LYME DISEASE 41 KD IGG: REACTIVE — AB
LYME DISEASE 41 KD IGM: NONREACTIVE
LYME DISEASE 45 KD IGG: NONREACTIVE
LYME DISEASE 66 KD IGG: NONREACTIVE
Lyme Disease 18 kD IgG: NONREACTIVE
Lyme Disease 23 kD IgG: NONREACTIVE
Lyme Disease 28 kD IgG: NONREACTIVE
Lyme Disease 30 kD IgG: NONREACTIVE
Lyme Disease 39 kD IgG: NONREACTIVE
Lyme Disease 58 kD IgG: NONREACTIVE
Lyme Disease 93 kD IgG: NONREACTIVE

## 2018-05-06 NOTE — Progress Notes (Signed)
Patient ID: LURLIE WIGEN, female    DOB: 1951/09/03, 67 y.o.   MRN: [id number]  HPI  Ms Ress is a 67 y/o female with a history of multiple sclerosis, migraine, previous tobacco use and chronic heart failure.   Echo report from 04/24/18 reviewed and showed an EF of 55-60% along with moderate MR.   Cardiac catheterization done 04/25/18 reviewed and showed an EF of 55-65% with normal coronary arteries.  Admitted 04/23/18 due to atypical chest pain and mild HF. Cardiology consult obtained. CTA negative for PE. Discharged after 2 days.   She presents today for her initial visit with a chief complaint of minimal shortness of breath upon moderate exertion. She says that this has been present for several months. She has associated fatigue, dizziness and rare chest pain. She denies any difficulty sleeping, abdominal distention, palpitations, pedal edema, cough or weight gain. Overall, she says that she feels much better and is looking forward to resuming her yoga and other physical activities soon. Does have appointments scheduled with cardiology and pulmonology. Had a cortisone injection in her foot ~ 1 week prior to hospitalization and was wondering if that could have triggered this.   Past Medical History:  Diagnosis Date  . CHF (congestive heart failure) ([place])   . Migraine   . Multiple sclerosis ([place])   . Neuromuscular disorder ([place])   . PMR (polymyalgia rheumatica) ([facility])    Past Surgical History:  Procedure Laterality Date  . BREAST EXCISIONAL BIOPSY    . BREAST SURGERY  1990  . COLONOSCOPY WITH PROPOFOL N/A 05/19/2015   Procedure: COLONOSCOPY WITH PROPOFOL;  Surgeon: Hulen Luster, MD;  Location: [facility] ENDOSCOPY;  Service: Gastroenterology;  Laterality: N/A;  . EYE SURGERY     1978 1st of 3 eye surgeries  . LEFT HEART CATH AND CORONARY ANGIOGRAPHY N/A 04/25/2018   Procedure: LEFT HEART CATH AND CORONARY ANGIOGRAPHY;  Surgeon: Wellington Hampshire, MD;  Location: [place] [facility];   Service: Cardiovascular;  Laterality: N/A;  . TENDON REPAIR     Family History  Problem Relation Age of Onset  . Heart disease Mother   . Hypertension Mother   . Parkinson's disease Mother   . Parkinsonism Mother   . Cancer Maternal Grandmother        breast  . Breast cancer Maternal Grandmother   . Asthma Father    Social History   Tobacco Use  . Smoking status: Former Research scientist (life sciences)  . Smokeless tobacco: Never Used  Substance Use Topics  . Alcohol use: Yes    Alcohol/week: 0.0 oz    Comment: 1 to 2 glass of wine at least 4 nights a week   No Known Allergies Prior to Admission medications   Medication Sig Start Date End Date Taking? Authorizing Provider  BIOTIN PO Take 1 tablet by mouth daily.   Yes [provider]  calcium carbonate (OS-CAL - DOSED IN MG OF ELEMENTAL CALCIUM) 1250 (500 Ca) MG tablet Take 1 tablet by mouth daily.   Yes [provider]  cholecalciferol (VITAMIN D) 1000 units tablet Take 2,000 Units by mouth daily.   Yes [provider]  cyanocobalamin 500 MCG tablet Take 500 mcg by mouth daily.   Yes [provider]  escitalopram (LEXAPRO) 5 MG tablet TAKE 1 TABLET BY MOUTH EVERY DAY 09/11/17  Yes Pleas Koch, NP  furosemide (LASIX) 20 MG tablet TAKE 1 TABLET BY MOUTH EVERY DAY 02/27/18  Yes Pleas Koch, NP  Teriflunomide (  AUBAGIO) 14 MG TABS Take 14 mg by mouth daily.   Yes [provider]  zolpidem (AMBIEN) 10 MG tablet Take 0.5-1 tablets (5-10 mg total) by mouth at bedtime as needed for sleep. 03/21/18  Yes Pleas Koch, NP   Review of Systems  Constitutional: Positive for fatigue. Negative for appetite change.  HENT: Negative for congestion, postnasal drip and sore throat.   Eyes: Negative.   Respiratory: Positive for shortness of breath (with moderate exertion). Negative for cough and chest tightness.   Cardiovascular: Positive for chest pain (rarely). Negative for palpitations and leg swelling.   Gastrointestinal: Negative for abdominal distention and abdominal pain.  Endocrine: Negative.   Genitourinary: Negative.   Musculoskeletal: Negative for back pain and neck pain.  Skin: Negative.   Allergic/Immunologic: Negative.   Neurological: Positive for dizziness (at times). Negative for light-headedness.  Hematological: Negative for adenopathy. Does not bruise/bleed easily.  Psychiatric/Behavioral: Negative for dysphoric mood and sleep disturbance. The patient is not nervous/anxious.    Vitals:   05/07/18 0853  BP: 120/75  Pulse: (!) 50  Resp: 18  SpO2: 100%  Weight: 120 lb 6 oz (54.6 kg)  Height: 5\' 6"  (1.676 m)   Wt Readings from Last 3 Encounters:  05/07/18 120 lb 6 oz (54.6 kg)  04/30/18 119 lb 8 oz (54.2 kg)  04/25/18 122 lb 8 oz (55.6 kg)   Lab Results  Component Value Date   CREATININE 0.69 04/25/2018   CREATININE 0.75 04/24/2018   CREATININE 0.69 04/23/2018   Physical Exam  Constitutional: She is oriented to person, place, and time. She appears well-developed and well-nourished.  HENT:  Head: Normocephalic and atraumatic.  Neck: Normal range of motion. Neck supple. No JVD present.  Cardiovascular: Regular rhythm. Bradycardia present.  Pulmonary/Chest: Effort normal. No respiratory distress. She has no wheezes. She has no rales.  Abdominal: Soft. She exhibits no distension. There is no tenderness.  Musculoskeletal: She exhibits no edema or tenderness.  Neurological: She is alert and oriented to person, place, and time.  Skin: Skin is warm and dry.  Psychiatric: She has a normal mood and affect. Her behavior is normal. Thought content normal.  Nursing note and vitals reviewed.   Assessment & Plan:  1: Chronic heart failure with preserved ejection fraction- - NYHA class II - euvolemic today - already weighing daily and she was instructed to call for an overnight weight gain of >2 pounds or a weekly weight gain of >5 pounds - not adding salt to her food  and has been reading food labels. Discussed the importance of closely following a 2000mg  sodium diet and written dietary information was given to her about this.  - has plans to keep her appointments with pulmonology as well as cardiology to get established with them - BMP 04/25/18 reviewed and showed sodium 140, potassium 4.0 and GFR >60 - BNP 05/02/18 was 161.0 which is down from 483.0 on 04/23/18 - planning on returning to yoga and her walking soon  2: Multiple Sclerosis- - she feels like this is stable at this time  Medication list was reviewed.  Will not make a follow-up for patient at this time. Advised her that she could call back at anytime to make another appointment.

## 2018-05-07 ENCOUNTER — Encounter: Payer: Self-pay | Admitting: Family

## 2018-05-07 ENCOUNTER — Ambulatory Visit: Payer: PPO | Attending: Family | Admitting: Family

## 2018-05-07 VITALS — BP 120/75 | HR 50 | Resp 18 | Ht 66.0 in | Wt 120.4 lb

## 2018-05-07 DIAGNOSIS — I5032 Chronic diastolic (congestive) heart failure: Secondary | ICD-10-CM | POA: Insufficient documentation

## 2018-05-07 DIAGNOSIS — M353 Polymyalgia rheumatica: Secondary | ICD-10-CM | POA: Diagnosis not present

## 2018-05-07 DIAGNOSIS — I509 Heart failure, unspecified: Secondary | ICD-10-CM | POA: Diagnosis not present

## 2018-05-07 DIAGNOSIS — G43909 Migraine, unspecified, not intractable, without status migrainosus: Secondary | ICD-10-CM | POA: Diagnosis not present

## 2018-05-07 DIAGNOSIS — Z87891 Personal history of nicotine dependence: Secondary | ICD-10-CM | POA: Insufficient documentation

## 2018-05-07 DIAGNOSIS — G709 Myoneural disorder, unspecified: Secondary | ICD-10-CM | POA: Insufficient documentation

## 2018-05-07 DIAGNOSIS — Z79899 Other long term (current) drug therapy: Secondary | ICD-10-CM | POA: Insufficient documentation

## 2018-05-07 DIAGNOSIS — G35 Multiple sclerosis: Secondary | ICD-10-CM | POA: Diagnosis not present

## 2018-05-07 NOTE — Patient Instructions (Signed)
Continue weighing daily and call for an overnight weight gain of > 2 pounds or a weekly weight gain of >5 pounds. 

## 2018-05-15 ENCOUNTER — Encounter: Payer: Self-pay | Admitting: Internal Medicine

## 2018-05-15 ENCOUNTER — Ambulatory Visit: Payer: PPO | Admitting: Nurse Practitioner

## 2018-05-15 ENCOUNTER — Encounter

## 2018-05-15 ENCOUNTER — Other Ambulatory Visit: Payer: Self-pay | Admitting: Internal Medicine

## 2018-05-15 ENCOUNTER — Encounter: Payer: Self-pay | Admitting: Nurse Practitioner

## 2018-05-15 ENCOUNTER — Ambulatory Visit: Payer: PPO | Admitting: Internal Medicine

## 2018-05-15 VITALS — BP 116/76 | HR 49 | Ht 66.0 in | Wt 121.0 lb

## 2018-05-15 VITALS — BP 104/64 | HR 46 | Ht 66.0 in | Wt 121.5 lb

## 2018-05-15 DIAGNOSIS — R0602 Shortness of breath: Secondary | ICD-10-CM

## 2018-05-15 DIAGNOSIS — R001 Bradycardia, unspecified: Secondary | ICD-10-CM | POA: Diagnosis not present

## 2018-05-15 DIAGNOSIS — I5032 Chronic diastolic (congestive) heart failure: Secondary | ICD-10-CM | POA: Diagnosis not present

## 2018-05-15 DIAGNOSIS — R918 Other nonspecific abnormal finding of lung field: Secondary | ICD-10-CM

## 2018-05-15 DIAGNOSIS — R911 Solitary pulmonary nodule: Secondary | ICD-10-CM

## 2018-05-15 DIAGNOSIS — R072 Precordial pain: Secondary | ICD-10-CM | POA: Diagnosis not present

## 2018-05-15 MED ORDER — ALBUTEROL SULFATE HFA 108 (90 BASE) MCG/ACT IN AERS
2.0000 | INHALATION_SPRAY | RESPIRATORY_TRACT | 2 refills | Status: DC | PRN
Start: 2018-05-15 — End: 2018-05-15

## 2018-05-15 MED ORDER — ALBUTEROL SULFATE HFA 108 (90 BASE) MCG/ACT IN AERS: 2.0000 | INHALATION_SPRAY | Freq: Four times a day (QID) | RESPIRATORY_TRACT | 3 refills | Status: DC | PRN

## 2018-05-15 NOTE — Patient Instructions (Signed)
Please use Albuterol 2-4 puffs every 4 hrs as needed for cough and SOB and prior to exercise Obtain PFT's  Obtain CT chest in 2 months

## 2018-05-15 NOTE — Progress Notes (Signed)
Name: Morgan Herrera MRN: [id number] DOB: 1951-01-05     CONSULTATION DATE: 05/15/2018 REFERRING MD : Carlis Abbott  CHIEF COMPLAINT: sob  STUDIES:    7.24.19 CT chest Independently reviewed by Me B/l emphysema LUL lung nodule  HISTORY OF PRESENT ILLNESS: 67 year old pleasant white female seen today for abnormal CT scan findings and follow-up hospital admission Patient was admitted in July of this year for increased work of breathing shortness of breath associated with extreme fatigue with severe headaches dizziness vertigo with chest pressure Patient was then seen by her PCP which ordered some lab tests and based upon those lab test was sent to the ER for further evaluation  Patient was worked up for PE and and eventually had a cardiac cath  The CT of the chest was obtained which did show some interstitial lung disease probable pulmonary edema with some underlying early interstitial lung disease may be from her chronic smoking history echo as well as a left upper lobe lung nodule which seems to be spiculated in nature  CT scan findings were shown to the patient and family  CT chest confirms that she did not have a PE Cardiac cath shows that she had normal coronaries  Patient was then discharged for follow-up  At this time based on her findings of above with a negative PE and normal cardiac cath, patient most likely had an atypical pneumonia viral infection that caused dehydration and cardiac strain.  Patient currently feels a whole lot better since discharge and the only residual complaint is cough Patient still is short of breath with activity   At baseline patient is very active and has no symptoms whatsoever She is a former smoker quit 20 years ago smoked 1/2 pack/day for 45 years  Patient also has a history of multiple sclerosis and sees neurology in [place] to be at baseline at this time   No signs of infection at this time No signs of heart failure at this  time    PAST MEDICAL HISTORY :   has a past medical history of (HFpEF) heart failure with preserved ejection fraction ([place]), Chest pain, Migraine, Multiple sclerosis ([place]), Neuromuscular disorder ([place]), PMR (polymyalgia rheumatica) ([place]), and Pulmonary nodule.  has a past surgical history that includes Breast surgery (1990); Eye surgery; Tendon repair; Colonoscopy with propofol (N/A, 05/19/2015); Breast excisional biopsy; and LEFT HEART CATH AND CORONARY ANGIOGRAPHY (N/A, 04/25/2018). Prior to Admission medications   Medication Sig Start Date End Date Taking? Authorizing Provider  BIOTIN PO Take 1 tablet by mouth daily.   Yes [provider]  calcium carbonate (OS-CAL - DOSED IN MG OF ELEMENTAL CALCIUM) 1250 (500 Ca) MG tablet Take 1 tablet by mouth daily.   Yes [provider]  cholecalciferol (VITAMIN D) 1000 units tablet Take 2,000 Units by mouth daily.   Yes [provider]  cyanocobalamin 500 MCG tablet Take 500 mcg by mouth daily.   Yes [provider]  escitalopram (LEXAPRO) 5 MG tablet TAKE 1 TABLET BY MOUTH EVERY DAY 09/11/17  Yes Pleas Koch, NP  furosemide (LASIX) 20 MG tablet TAKE 1 TABLET BY MOUTH EVERY DAY 02/27/18  Yes Pleas Koch, NP  Teriflunomide (AUBAGIO) 14 MG TABS Take 14 mg by mouth daily.   Yes [provider]  zolpidem (AMBIEN) 10 MG tablet Take 0.5-1 tablets (5-10 mg total) by mouth at bedtime as needed for sleep. 03/21/18  Yes Pleas Koch, NP   No Known Allergies  FAMILY HISTORY:  family history includes Asthma in her father; Breast cancer in her maternal grandmother; Cancer in her maternal grandmother; Heart disease in her mother; Hypertension in her mother; Parkinson's disease in her mother; Parkinsonism in her mother. SOCIAL HISTORY:  reports that she has quit smoking. She has never used smokeless tobacco. She reports that she drinks alcohol. She reports that she does not use drugs.  REVIEW OF SYSTEMS:    Constitutional: Negative for fever, chills, weight loss, malaise/fatigue and diaphoresis.  HENT: Negative for hearing loss, ear pain, nosebleeds, congestion, sore throat, neck pain, tinnitus and ear discharge.   Eyes: Negative for blurred vision, double vision, photophobia, pain, discharge and redness.  Respiratory:+cough, hemoptysis,-sputum production, + shortness of breath,-wheezing and-stridor.   Cardiovascular: Negative for chest pain, palpitations, orthopnea, claudication, leg swelling and PND.  Gastrointestinal: Negative for heartburn, nausea, vomiting, abdominal pain, diarrhea, constipation, blood in stool and melena.  Genitourinary: Negative for dysuria, urgency, frequency, hematuria and flank pain.  Musculoskeletal: Negative for myalgias, back pain, joint pain and falls.  Skin: Negative for itching and rash.  Neurological: Negative for dizziness, tingling, tremors, sensory change, speech change, focal weakness, seizures, loss of consciousness, weakness and headaches.  Endo/Heme/Allergies: Negative for environmental allergies and polydipsia. Does not bruise/bleed easily.  ALL OTHER ROS ARE NEGATIVE   BP 116/76 (BP Location: Left Arm, Cuff Size: Normal)   Pulse (!) 49   Ht 5\' 6"  (1.676 m)   Wt 121 lb (54.9 kg)   SpO2 99%   BMI 19.53 kg/m   Physical Examination:   GENERAL:NAD, no fevers, chills, no weakness no fatigue HEAD: Normocephalic, atraumatic.  EYES: Pupils equal, round, reactive to light. Extraocular muscles intact. No scleral icterus.  MOUTH: Moist mucosal membrane.   EAR, NOSE, THROAT: Clear without exudates. No external lesions.  NECK: Supple. No thyromegaly. No nodules. No JVD.  PULMONARY:CTA B/L no wheezes, no crackles, no rhonchi CARDIOVASCULAR: S1 and S2. Regular rate and rhythm. No murmurs, rubs, or gallops. No edema.  GASTROINTESTINAL: Soft, nontender, nondistended. No masses. Positive bowel sounds.  MUSCULOSKELETAL: No swelling, clubbing, or edema. Range  of motion full in all extremities.  NEUROLOGIC: Cranial nerves II through XII are intact. No gross focal neurological deficits.  SKIN: No ulceration, lesions, rashes, or cyanosis. Skin warm and dry. Turgor intact.  PSYCHIATRIC: Mood, affect within normal limits. The patient is awake, alert and oriented x 3. Insight, judgment intact.      ASSESSMENT / PLAN: 67 year old pleasant white female seen today for abnormal CT chest findings in the setting of probable atypical pneumonia with a history of cardiac strain in the setting of prior history of smoking with probable underlying COPD With left upper lobe nodule on CT scan.  #1 at this time I would recommend albuterol inhaler 2 to 4 puffs as needed and prior to exercise #2 I would recommend obtaining pulmonary function testing to assess for underlying COPD #3 recommend follow-up CT chest in 2 months to assess left upper lobe nodule  Patient/Family are satisfied with Plan of action and management. All questions answered Follow-up in 2 to 3 months after CT chest and PFTs completed  Corrin Parker, M.D.  [facility]  Medical Director [place] Director [facility] Cardio-Pulmonary Department

## 2018-05-15 NOTE — Patient Instructions (Signed)
Medication Instructions:  Your physician recommends that you continue on your current medications as directed. Please refer to the Current Medication list given to you today.   Labwork: Your physician recommends that you return for lab work in: TODAY (CMET).   Testing/Procedures: none  Follow-Up: Your physician recommends that you schedule a follow-up appointment in: 2-3 [place].   If you need a refill on your cardiac medications before your next appointment, please call your pharmacy.

## 2018-05-15 NOTE — Progress Notes (Signed)
Office Visit    Patient Name: Morgan Herrera Date of Encounter: 05/15/2018  Primary Care Provider:  Pleas Koch, NP Primary Cardiologist:  Kathlyn Sacramento, MD  Chief Complaint    67 year old female with a history of multiple sclerosis, optic neuritis, polymyalgia rheumatica, remote tobacco abuse, migraines, and recent admission for chest pain with catheterization revealing normal coronary arteries and sluggish flow in the LAD suggestive of endothelial dysfunction, who presents for follow-up.  Past Medical History    Past Medical History:  Diagnosis Date  . (HFpEF) heart failure with preserved ejection fraction ([place])    a. 03/2018 Echo: EF 55-60%, no rwma. Mod MR. Nl PASP.  Marland Kitchen Chest pain    a. 03/2018 Ex MV: rest only imaging showed ant wall perfusion defect. No stress imaging as pt developed hypotension w/ exercise; b. 05/2018 Cath: Nl cors w/ sluggish flow in teh LAD suggestive of endothelial dysfxn. EF 55-65%.  . Migraine   . Multiple sclerosis ([place])   . Neuromuscular disorder ([place])   . PMR (polymyalgia rheumatica) ([facility])   . Pulmonary nodule    a. 03/2018 CTA Chest: 75mm small spiculated nodular density @ the L lung apex. Rec f/u in 3 mos.   Past Surgical History:  Procedure Laterality Date  . BREAST EXCISIONAL BIOPSY    . BREAST SURGERY  1990  . COLONOSCOPY WITH PROPOFOL N/A 05/19/2015   Procedure: COLONOSCOPY WITH PROPOFOL;  Surgeon: Hulen Luster, MD;  Location: [facility] ENDOSCOPY;  Service: Gastroenterology;  Laterality: N/A;  . EYE SURGERY     1978 1st of 3 eye surgeries  . LEFT HEART CATH AND CORONARY ANGIOGRAPHY N/A 04/25/2018   Procedure: LEFT HEART CATH AND CORONARY ANGIOGRAPHY;  Surgeon: Wellington Hampshire, MD;  Location: [place] [facility] LAB;  Service: Cardiovascular;  Laterality: N/A;  . TENDON REPAIR      Allergies  No Known Allergies  History of Present Illness    67 year old female with a history of multiple sclerosis, optic neuritis, polymyalgia  rheumatica, remote tobacco abuse, and migraines.  She was recently seen by primary care on July 24 with a 2-week history of fatigue, myalgias, DOE, and chest pressure.  A d-dimer was evaluated and found to be elevated at 1.68 and she was referred to the emergency department for further evaluation.  Further lab work there showed mild elevation of the BNP at 483, elevated troponin at 0.09, and LFT elevations with an AST of 137, ALT of 162, and alkaline phosphatase of 209.  CTA of the chest was performed and was negative for PE but did show a 9 mm left lung apical nodule with recommendation for follow-up in 3 months.  Patient was admitted and echocardiogram was performed and showed normal LV function.  Stress testing was ordered and when she was on the treadmill, she developed hypotension prompting discontinuation of the study.  At that point, decision was made to pursue diagnostic catheterization which revealed normal coronary arteries with sluggish flow in the LAD suggestive of endothelial dysfunction.  LV function was normal.  Medical therapy was recommended and she was placed on long-acting nitrate therapy however, this resulted in a headache and it was discontinued.  She was discharged home on July 26.  Since discharge, she has noticed some improvement in exercise tolerance.  She is feeling much better than she did 2 or so weeks ago but is not quite back to baseline.  She has since followed up with primary care with repeat LFTs showing some  improvement.  She also had a Lyme's titer which was nonreactive.  TSH was normal at 2.22 on August 2.  She has been exercising some and has seen some improvement in exercise tolerance.  She still has some dyspnea on exertion but overall this is improving.  She followed up with pulmonology earlier today with regards to pulmonary nodule with recommendation for follow-up CT in 2 months.  She is also been prescribed an inhaler and is getting set up for pulmonary function tests.   She has not had any chest pain and her weight is been stable.  She denies palpitations, PND, orthopnea, dizziness, syncope, edema, or early satiety.  Home Medications    Prior to Admission medications   Medication Sig Start Date End Date Taking? Authorizing Provider  BIOTIN PO Take 1 tablet by mouth daily.    [provider]  calcium carbonate (OS-CAL - DOSED IN MG OF ELEMENTAL CALCIUM) 1250 (500 Ca) MG tablet Take 1 tablet by mouth daily.    [provider]  cholecalciferol (VITAMIN D) 1000 units tablet Take 2,000 Units by mouth daily.    [provider]  cyanocobalamin 500 MCG tablet Take 500 mcg by mouth daily.    [provider]  escitalopram (LEXAPRO) 5 MG tablet TAKE 1 TABLET BY MOUTH EVERY DAY 09/11/17   Pleas Koch, NP  furosemide (LASIX) 20 MG tablet TAKE 1 TABLET BY MOUTH EVERY DAY 02/27/18   Pleas Koch, NP  Teriflunomide (AUBAGIO) 14 MG TABS Take 14 mg by mouth daily.    [provider]  zolpidem (AMBIEN) 10 MG tablet Take 0.5-1 tablets (5-10 mg total) by mouth at bedtime as needed for sleep. 03/21/18   Pleas Koch, NP    Review of Systems    Exercise tolerance not quite back to baseline but definitely improving.  She denies chest pain, palpitations, PND, orthopnea, dizziness, syncope, edema, or early satiety.  All other systems reviewed and are otherwise negative except as noted above.  Physical Exam    VS:  BP 104/64 (BP Location: Left Arm, Patient Position: Sitting, Cuff Size: Normal)   Pulse (!) 46   Ht 5\' 6"  (1.676 m)   Wt 121 lb 8 oz (55.1 kg)   BMI 19.61 kg/m  , BMI Body mass index is 19.61 kg/m. GEN: Well nourished, well developed, in no acute distress.  HEENT: normal.  Neck: Supple, no JVD, carotid bruits, or masses. Cardiac: RRR, no murmurs, rubs, or gallops. No clubbing, cyanosis, edema.  Radials/DP/PT 2+ and equal bilaterally.  Respiratory:  Respirations regular and unlabored, clear to  auscultation bilaterally.  Right radial catheterization site without bleeding, bruit, or hematoma. GI: Soft, nontender, nondistended, BS + x 4. MS: no deformity or atrophy. Skin: warm and dry, no rash. Neuro:  Strength and sensation are intact. Psych: Normal affect.  Accessory Clinical Findings    ECG -sinus bradycardia, 46, no acute ST or T changes.  U waves noted.  Assessment & Plan    1.  Chest pain: Patient was admitted to [facility] in late July with chest pain, dyspnea, arthralgias/myalgias, and fatigue.  Stress testing notable for hypotension resulting in diagnostic catheterization which showed normal coronary arteries and sluggish flow in the LAD with question of endothelial dysfunction.  She did not tolerate long-acting nitrates.  She historically has a soft blood pressure, 104/64 today and is unlikely to tolerate calcium channel blockers.  She has been doing better since her hospitalization with improvement in exercise tolerance  though she is not quite back to baseline.  She has not been having any chest pain.  No further work-up at this time.    2.  HFrEF: At time of catheterization, she was noted to have mildly elevated left ventricular end-diastolic pressure and has been using Lasix 20 mg every other day.  Her volume and weight have been stable at home.  She has noted some improvement in exercise tolerance.  She is euvolemic on exam today.  I will follow-up a basic metabolic panel especially in the setting of bradycardia with Q waves in ECG.  We discussed the importance of daily weights, sodium restriction, medication compliance, and symptom reporting and she verbalizes understanding.   3.  Pulmonary nodule: Seen by pulmonology with plan for follow-up CT in 2 months.  4.  Elevated LFTs: This was noted during hospitalization and felt potentially be secondary to medication for multiple sclerosis.  LFTs slightly improved earlier this month.  We will follow-up today in the setting  of repeat blood chemistry.  5.  Multiple sclerosis: Followed by neurology.  6.  Sinus Bradycardia:  HR 46.  Not on AVN blocking agents.she says she is accustomed to having slower heart rates but not necessarily this low.  She is asymptomatic.  Blood pressure soft but stable.  Follow-up basic metabolic panel.    7.  Disposition: Follow-up complete metabolic panel today.  Follow-up in clinic in 2 to 3 months or sooner if necessary.  Murray Hodgkins, NP 05/15/2018, 3:20 PM

## 2018-05-16 LAB — COMPREHENSIVE METABOLIC PANEL
A/G RATIO: 2 (ref 1.2–2.2)
ALBUMIN: 4.4 g/dL (ref 3.6–4.8)
ALT: 17 IU/L (ref 0–32)
AST: 19 IU/L (ref 0–40)
Alkaline Phosphatase: 107 IU/L (ref 39–117)
BUN / CREAT RATIO: 44 — AB (ref 12–28)
BUN: 26 mg/dL (ref 8–27)
Bilirubin Total: 0.2 mg/dL (ref 0.0–1.2)
CALCIUM: 9.6 mg/dL (ref 8.7–10.3)
CO2: 23 mmol/L (ref 20–29)
Chloride: 106 mmol/L (ref 96–106)
Creatinine, Ser: 0.59 mg/dL (ref 0.57–1.00)
GFR, EST AFRICAN AMERICAN: 110 mL/min/{1.73_m2} (ref >59)
GFR, EST NON AFRICAN AMERICAN: 96 mL/min/{1.73_m2} (ref >59)
Globulin, Total: 2.2 g/dL (ref 1.5–4.5)
Glucose: 98 mg/dL (ref 65–99)
Potassium: 4.2 mmol/L (ref 3.5–5.2)
SODIUM: 142 mmol/L (ref 134–144)
TOTAL PROTEIN: 6.6 g/dL (ref 6.0–8.5)

## 2018-05-28 ENCOUNTER — Other Ambulatory Visit (INDEPENDENT_AMBULATORY_CARE_PROVIDER_SITE_OTHER): Payer: PPO

## 2018-05-28 DIAGNOSIS — G35 Multiple sclerosis: Secondary | ICD-10-CM | POA: Diagnosis not present

## 2018-05-28 DIAGNOSIS — Z79899 Other long term (current) drug therapy: Secondary | ICD-10-CM

## 2018-05-28 LAB — HEPATIC FUNCTION PANEL
ALBUMIN: 4.5 g/dL (ref 3.5–5.2)
ALT: 24 U/L (ref 0–35)
AST: 25 U/L (ref 0–37)
Alkaline Phosphatase: 61 U/L (ref 39–117)
BILIRUBIN TOTAL: 0.7 mg/dL (ref 0.2–1.2)
Bilirubin, Direct: 0.1 mg/dL (ref 0.0–0.3)
TOTAL PROTEIN: 7.2 g/dL (ref 6.0–8.3)

## 2018-05-28 LAB — CBC WITH DIFFERENTIAL/PLATELET
BASOS ABS: 0.1 10*3/uL (ref 0.0–0.1)
BASOS PCT: 2.3 % (ref 0.0–3.0)
EOS ABS: 0.2 10*3/uL (ref 0.0–0.7)
Eosinophils Relative: 4 % (ref 0.0–5.0)
HEMATOCRIT: 38.7 % (ref 36.0–46.0)
HEMOGLOBIN: 12.9 g/dL (ref 12.0–15.0)
LYMPHS PCT: 26.9 % (ref 12.0–46.0)
Lymphs Abs: 1.3 10*3/uL (ref 0.7–4.0)
MCHC: 33.4 g/dL (ref 30.0–36.0)
MCV: 85.7 fl (ref 78.0–100.0)
MONO ABS: 0.7 10*3/uL (ref 0.1–1.0)
Monocytes Relative: 14.2 % — ABNORMAL HIGH (ref 3.0–12.0)
Neutro Abs: 2.5 10*3/uL (ref 1.4–7.7)
Neutrophils Relative %: 52.6 % (ref 43.0–77.0)
Platelets: 237 10*3/uL (ref 150.0–400.0)
RBC: 4.52 Mil/uL (ref 3.87–5.11)
RDW: 15.7 % — AB (ref 11.5–15.5)
WBC: 4.8 10*3/uL (ref 4.0–10.5)

## 2018-05-28 LAB — VITAMIN D 25 HYDROXY (VIT D DEFICIENCY, FRACTURES): VITD: 83.88 ng/mL (ref 30.00–100.00)

## 2018-05-29 ENCOUNTER — Other Ambulatory Visit: Payer: Self-pay | Admitting: Primary Care

## 2018-05-29 DIAGNOSIS — R609 Edema, unspecified: Secondary | ICD-10-CM

## 2018-05-30 ENCOUNTER — Ambulatory Visit (INDEPENDENT_AMBULATORY_CARE_PROVIDER_SITE_OTHER): Payer: PPO | Admitting: Family Medicine

## 2018-05-30 ENCOUNTER — Encounter: Payer: Self-pay | Admitting: Family Medicine

## 2018-05-30 VITALS — BP 130/78 | HR 50 | Temp 97.8°F | Ht 66.0 in | Wt 122.0 lb

## 2018-05-30 DIAGNOSIS — R6884 Jaw pain: Secondary | ICD-10-CM | POA: Diagnosis not present

## 2018-05-30 NOTE — Progress Notes (Signed)
Subjective:    Patient ID: Morgan Herrera, female    DOB: [birth year], 67 y.o.   MRN: [id number]  HPI This is a 67 yo female who presents today with jaw pain x 1 week.  Started with one tooth aching a couple of weeks ago and was told it was from place between teeth on gum (saw dentist) and used peroxil with resolution. Pain has come back and is not resolved with peroxil. This pain is different and is not around tooth but is along left jaw line. Ear is achy and pain under jaw. Several spots along jaw line feel tender. Some clear nasal drainage and occasional dry cough. No fever or chills. Good relief with ibuprofen 400 mg 1-2 times per day.   Had recent labs CBC (05/28/18- nml hgb/hct/WBC), hepatic function (05/28/18 nml), CMP (05/15/18- elevated BUN, otherwise normal).   Past Medical History:  Diagnosis Date  . (HFpEF) heart failure with preserved ejection fraction ([place])    a. 03/2018 Echo: EF 55-60%, no rwma. Mod MR. Nl PASP.  Marland Kitchen Chest pain    a. 03/2018 Ex MV: rest only imaging showed ant wall perfusion defect. No stress imaging as pt developed hypotension w/ exercise; b. 05/2018 Cath: Nl cors w/ sluggish flow in teh LAD suggestive of endothelial dysfxn. EF 55-65%.  . Migraine   . Multiple sclerosis ([place])   . Neuromuscular disorder ([place])   . PMR (polymyalgia rheumatica) (HCC)   . Pulmonary nodule    a. 03/2018 CTA Chest: 3mm small spiculated nodular density @ the L lung apex. Rec f/u in 3 mos.   Past Surgical History:  Procedure Laterality Date  . BREAST EXCISIONAL BIOPSY    . BREAST SURGERY  1990  . COLONOSCOPY WITH PROPOFOL N/A 05/19/2015   Procedure: COLONOSCOPY WITH PROPOFOL;  Surgeon: Hulen Luster, MD;  Location: [facility] ENDOSCOPY;  Service: Gastroenterology;  Laterality: N/A;  . EYE SURGERY     1978 1st of 3 eye surgeries  . LEFT HEART CATH AND CORONARY ANGIOGRAPHY N/A 04/25/2018   Procedure: LEFT HEART CATH AND CORONARY ANGIOGRAPHY;  Surgeon: Wellington Hampshire, MD;  Location: [place] CV LAB;  Service: Cardiovascular;  Laterality: N/A;  . TENDON REPAIR     Family History  Problem Relation Age of Onset  . Heart disease Mother   . Hypertension Mother   . Parkinson's disease Mother   . Parkinsonism Mother   . Cancer Maternal Grandmother        breast  . Breast cancer Maternal Grandmother   . Asthma Father       Review of Systems Per HPI    Objective:   Physical Exam  Constitutional: She is oriented to person, place, and time. She appears well-developed and well-nourished.  HENT:  Head: Normocephalic and atraumatic.  Right Ear: Tympanic membrane, external ear and ear canal normal. No tenderness.  Left Ear: Tympanic membrane, external ear and ear canal normal. No tenderness.  Nose: Nose normal.  Mouth/Throat: Uvula is midline, oropharynx is clear and moist and mucous membranes are normal. No dental abscesses (no erythema, edema or drainage from gums.).  Mild tenderness along left anterior jaw, no erythema, no edema, no warmth.   Eyes: Conjunctivae are normal.  Neck: Normal range of motion. Neck supple.  Pulmonary/Chest: Effort normal.  Lymphadenopathy:  Tender, small lymph submandibular nodes on left.   Neurological: She is alert and oriented to person, place, and time.  Skin: Skin is warm and dry.  Psychiatric: She has  a normal mood and affect. Her behavior is normal. Judgment and thought content normal.  Vitals reviewed.     BP 130/78 (BP Location: Right Arm, Patient Position: Sitting, Cuff Size: Normal)   Pulse (!) 50   Temp 97.8 F (36.6 C) (Oral)   Ht 5\' 6"  (1.676 m)   Wt 122 lb (55.3 kg)   SpO2 95%   BMI 19.69 kg/m  Wt Readings from Last 3 Encounters:  05/30/18 122 lb (55.3 kg)  05/15/18 121 lb 8 oz (55.1 kg)  05/15/18 121 lb (54.9 kg)       Assessment & Plan:  1. Pain in jaw not originating in temporomandibular joint - discussed recent normal labs which are reassuring - no s/s infectious process  - RTC precautions reviewed-  fever/chills/erythema/swelling - continue ibuprofen and can use heat/ice for comfort   Clarene Reamer, FNP-BC  [facility], [place]  05/30/2018 11:37 AM

## 2018-06-03 ENCOUNTER — Telehealth: Payer: Self-pay | Admitting: *Deleted

## 2018-06-03 ENCOUNTER — Encounter: Payer: Self-pay | Admitting: *Deleted

## 2018-06-03 NOTE — Telephone Encounter (Signed)
Results sent via My Chart.  

## 2018-06-03 NOTE — Telephone Encounter (Signed)
-----   Message from Chester Holstein, LPN sent at 04/07/5884  7:58 AM EDT -----   ----- Message ----- From: Pieter Partridge, DO Sent: 05/30/2018   1:13 PM EDT To: Clois Comber, CMA  Labs look okay

## 2018-06-26 ENCOUNTER — Ambulatory Visit (INDEPENDENT_AMBULATORY_CARE_PROVIDER_SITE_OTHER): Payer: PPO

## 2018-06-26 DIAGNOSIS — Z23 Encounter for immunization: Secondary | ICD-10-CM | POA: Diagnosis not present

## 2018-06-27 ENCOUNTER — Ambulatory Visit: Payer: PPO | Admitting: Podiatry

## 2018-06-27 DIAGNOSIS — G5762 Lesion of plantar nerve, left lower limb: Secondary | ICD-10-CM

## 2018-06-27 NOTE — Progress Notes (Signed)
Subjective:  Patient ID: Morgan Herrera, female    DOB: January 01, 1951,  MRN: [id number]  Chief Complaint  Patient presents with  . Foot Pain    Morton neuroma left  - pt requesting injection    67 y.o. female presents with the above complaint.  States that the left foot started hurting again.  Pain completely resolved after the last visit when she received the injection and only came back recently.  Going on a trip for a cruise to [place] in [place] and would like to receive injection prior to her trip.   Review of Systems: Negative except as noted in the HPI. Denies N/V/F/Ch.  Past Medical History:  Diagnosis Date  . (HFpEF) heart failure with preserved ejection fraction ([place])    a. 03/2018 Echo: EF 55-60%, no rwma. Mod MR. Nl PASP.  Marland Kitchen Chest pain    a. 03/2018 Ex MV: rest only imaging showed ant wall perfusion defect. No stress imaging as pt developed hypotension w/ exercise; b. 05/2018 Cath: Nl cors w/ sluggish flow in teh LAD suggestive of endothelial dysfxn. EF 55-65%.  . Migraine   . Multiple sclerosis ([place])   . Neuromuscular disorder ([place])   . PMR (polymyalgia rheumatica) (HCC)   . Pulmonary nodule    a. 03/2018 CTA Chest: 45mm small spiculated nodular density @ the L lung apex. Rec f/u in 3 mos.    Current Outpatient Medications:  .  albuterol (PROAIR HFA) 108 (90 Base) MCG/ACT inhaler, Inhale 2 puffs into the lungs every 6 (six) hours as needed for wheezing or shortness of breath., Disp: 8 g, Rfl: 3 .  BIOTIN PO, Take 1 tablet by mouth daily., Disp: , Rfl:  .  calcium carbonate (OS-CAL - DOSED IN MG OF ELEMENTAL CALCIUM) 1250 (500 Ca) MG tablet, Take 1 tablet by mouth daily., Disp: , Rfl:  .  cholecalciferol (VITAMIN D) 1000 units tablet, Take 2,000 Units by mouth daily., Disp: , Rfl:  .  cyanocobalamin 500 MCG tablet, Take 500 mcg by mouth daily., Disp: , Rfl:  .  escitalopram (LEXAPRO) 5 MG tablet, TAKE 1 TABLET BY MOUTH EVERY DAY, Disp: 90 tablet, Rfl: 1 .  furosemide  (LASIX) 20 MG tablet, TAKE 1 TABLET BY MOUTH EVERY DAY, Disp: 90 tablet, Rfl: 1 .  Teriflunomide (AUBAGIO) 14 MG TABS, Take 14 mg by mouth daily., Disp: , Rfl:  .  zolpidem (AMBIEN) 10 MG tablet, Take 0.5-1 tablets (5-10 mg total) by mouth at bedtime as needed for sleep., Disp: 90 tablet, Rfl: 0  Social History   Tobacco Use  Smoking Status Former Smoker  Smokeless Tobacco Never Used    No Known Allergies Objective:  There were no vitals filed for this visit. There is no height or weight on file to calculate BMI. Constitutional Well developed. Well nourished.  Vascular Dorsalis pedis pulses palpable bilaterally. Posterior tibial pulses palpable bilaterally. Capillary refill normal to all digits.  No cyanosis or clubbing noted. Pedal hair growth normal.  Neurologic Normal speech. Oriented to person, place, and time. Epicritic sensation to light touch grossly present bilaterally.  Dermatologic Nails well groomed and normal in appearance. No open wounds. No skin lesions.  Orthopedic: Normal joint ROM without pain or crepitus bilaterally. No visible deformities. Pain to palpation about the left third interspace with palpable Mulder's click   Radiographs: None today Assessment:   1. Morton neuroma, left    Plan:  Patient was evaluated and treated and all questions answered.  Morton's neuroma left -Injection  delivered left third interspace as below -Metatarsal pads dispensed -Follow-up as needed for continued pain  Procedure: Neuroma Injection Location: Left 3rd interspace Skin Prep: Alcohol. Injectate: 0.5 cc 0.5% marcaine plain, 0.5 cc dexamethasone phosphate. Disposition: Patient tolerated procedure well. Injection site dressed with a band-aid.   No follow-ups on file.

## 2018-07-11 ENCOUNTER — Ambulatory Visit (INDEPENDENT_AMBULATORY_CARE_PROVIDER_SITE_OTHER): Payer: PPO | Admitting: Primary Care

## 2018-07-11 ENCOUNTER — Encounter: Payer: Self-pay | Admitting: Primary Care

## 2018-07-11 VITALS — BP 106/70 | HR 52 | Temp 98.2°F | Ht 66.0 in | Wt 124.5 lb

## 2018-07-11 DIAGNOSIS — S76312A Strain of muscle, fascia and tendon of the posterior muscle group at thigh level, left thigh, initial encounter: Secondary | ICD-10-CM | POA: Diagnosis not present

## 2018-07-11 MED ORDER — NAPROXEN 500 MG PO TABS: 500.0000 mg | ORAL_TABLET | Freq: Two times a day (BID) | ORAL | 0 refills | Status: DC

## 2018-07-11 NOTE — Patient Instructions (Addendum)
Start naproxen 500 mg tablets for the strain. Take 1 tablet by mouth twice daily with food for one week.   Continue to rest and stretch the hamstring. Take a look at the information below.   You can apply ice or heat as needed for reduction in discomfort.  You are welcome to schedule an appointment with Dr. Lorelei Pont if no improvement in 1-2 weeks.   It was a pleasure to see you today!   Hamstring Strain A hamstring strain is an injury that occurs when the hamstring muscles are overstretched or overloaded. The hamstring muscles are a group of muscles at the back of the thighs. These muscles are used in straightening the hips, bending the knees, and pulling back the legs. This type of injury is often called a pulled hamstring muscle. The severity of a muscle strain is rated in degrees. First-degree strains have the least amount of muscle fiber tearing and pain. Second-degree and third-degree strains have increasingly more tearing and pain. What are the causes? Hamstring strains occur when a sudden, violent force is placed on these muscles and stretches them too far. This often occurs during activities that involve running, jumping, kicking, or weight lifting. What increases the risk? Hamstring strains are especially common in athletes. Other things that can increase your risk for this injury include:  Having low strength, endurance, or flexibility of the hamstring muscles.  Performing high-impact physical activity.  Having poor physical fitness.  Having a previous leg injury.  Having fatigued muscles.  Older age.  What are the signs or symptoms?  Pain in the back of the thigh.  Bruising.  Swelling.  Muscle spasm.  Difficulty using the muscle because of pain or lack of normal function. For severe strains, you may have a popping or snapping feeling when the injury occurs. How is this diagnosed? Your health care provider will perform a physical exam and ask about your medical  history. How is this treated? Often, the best treatment for a hamstring strain is protecting, resting, icing, applying compression, and elevating the injured area. This is referred to as the PRICE method of treatment. Your health care provider may also recommend medicines to help reduce pain or inflammation. Follow these instructions at home:  Use the PRICE method of treatment to promote muscle healing during the first 2-3 days after your injury. The PRICE method involves: ? P-Protecting the muscle from being injured again. ? R-Restricting your activity and resting the injured body part. ? I-Icing your injury. To do this, put ice in a plastic bag. Place a towel between your skin and the bag. Then, apply the ice and leave it on for 20 minutes, 2-3 times per day. After the third day, switch to moist heat packs. ? C-Applying compression to the injured area with an elastic bandage. Be careful not to wrap it too tightly. That may interfere with blood circulation or may increase swelling. ? E-Elevating the injured body part above the level of your heart as often as you can. You can do this by putting a pillow under your thigh when you sit or lie down.  Take medicines only as directed by your health care provider.  Begin exercising or stretching as directed by your health care provider.  Do not return to full activity level until your health care provider approves.  Keep all follow-up visits as directed by your health care provider. This is important. Contact a health care provider if:  You have increasing pain or swelling in the  injured area.  You have numbness, tingling, or a significant loss of strength in the injured area.  Your foot or your toes become cold or turn blue. This information is not intended to replace advice given to you by your health care provider. Make sure you discuss any questions you have with your health care provider. Document Released: 06/12/2001 Document Revised:  02/23/2016 Document Reviewed: 05/03/2014 Elsevier Interactive Patient Education  2018 [employer].

## 2018-07-11 NOTE — Progress Notes (Signed)
Subjective:    Patient ID: Morgan Herrera, female    DOB: [birth year], 67 y.o.   MRN: [id number]  HPI  Morgan Herrera is a 67 year old female who presents today with a chief complaint of lower extremity pain.  Her pain is located to the left posterior thigh/hamstring region for which she noticed 2 weeks ago. She was in Yoga when she pulled her hamstring, felt it pop with pain. She did work on resting her leg and taking PRN Advil with improvement up until recently. Last week she was on a cruise ship and accidentally slipped forward falling with her left leg flexed which pulled the hamstring even more. Since then she's noticed persistent pain with certain movements with flexion and extension of her hamstring. She experiences pain when taking larger steps.  She been resting, taking Advil, Aleve, applying topical OTC treatment with some improvement but not much. She denies weakness, numbness/tingling, lower back/hip pain.   Review of Systems  Musculoskeletal:       Left hamstring pain  Skin: Negative for color change.  Neurological: Negative for weakness and numbness.       Past Medical History:  Diagnosis Date  . (HFpEF) heart failure with preserved ejection fraction ([place])    a. 03/2018 Echo: EF 55-60%, no rwma. Mod MR. Nl PASP.  Marland Kitchen Chest pain    a. 03/2018 Ex MV: rest only imaging showed ant wall perfusion defect. No stress imaging as pt developed hypotension w/ exercise; b. 05/2018 Cath: Nl cors w/ sluggish flow in teh LAD suggestive of endothelial dysfxn. EF 55-65%.  . Migraine   . Multiple sclerosis ([place])   . Neuromuscular disorder ([place])   . PMR (polymyalgia rheumatica) (HCC)   . Pulmonary nodule    a. 03/2018 CTA Chest: 64mm small spiculated nodular density @ the L lung apex. Rec f/u in 3 mos.     Social History   Socioeconomic History  . Marital status: Married    Spouse name: Not on file  . Number of children: Not on file  . Years of education: Not on file  . Highest  education level: Not on file  Occupational History  . Not on file  Social Needs  . Financial resource strain: Not on file  . Food insecurity:    Worry: Not on file    Inability: Not on file  . Transportation needs:    Medical: Not on file    Non-medical: Not on file  Tobacco Use  . Smoking status: Former Research scientist (life sciences)  . Smokeless tobacco: Never Used  Substance and Sexual Activity  . Alcohol use: Yes    Alcohol/week: 0.0 standard drinks    Comment: 1 to 2 glass of wine at least 4 nights a week  . Drug use: No  . Sexual activity: Not on file  Lifestyle  . Physical activity:    Days per week: Not on file    Minutes per session: Not on file  . Stress: Not on file  Relationships  . Social connections:    Talks on phone: Not on file    Gets together: Not on file    Attends religious service: Not on file    Active member of club or organization: Not on file    Attends meetings of clubs or organizations: Not on file    Relationship status: Not on file  . Intimate partner violence:    Fear of current or ex partner: Not on file    Emotionally  abused: Not on file    Physically abused: Not on file    Forced sexual activity: Not on file  Other Topics Concern  . Not on file  Social History Narrative   Married.   Moved here from [place] after 35 years   Enjoys arts and crafts. Taking a painting class.   Walking with her spouse.    Reading, shopping.    Past Surgical History:  Procedure Laterality Date  . BREAST EXCISIONAL BIOPSY    . BREAST SURGERY  1990  . COLONOSCOPY WITH PROPOFOL N/A 05/19/2015   Procedure: COLONOSCOPY WITH PROPOFOL;  Surgeon: Hulen Luster, MD;  Location: [facility] ENDOSCOPY;  Service: Gastroenterology;  Laterality: N/A;  . EYE SURGERY     1978 1st of 3 eye surgeries  . LEFT HEART CATH AND CORONARY ANGIOGRAPHY N/A 04/25/2018   Procedure: LEFT HEART CATH AND CORONARY ANGIOGRAPHY;  Surgeon: Wellington Hampshire, MD;  Location: [place] CV LAB;  Service: Cardiovascular;   Laterality: N/A;  . TENDON REPAIR      Family History  Problem Relation Age of Onset  . Heart disease Mother   . Hypertension Mother   . Parkinson's disease Mother   . Parkinsonism Mother   . Cancer Maternal Grandmother        breast  . Breast cancer Maternal Grandmother   . Asthma Father     No Known Allergies  Current Outpatient Medications on File Prior to Visit  Medication Sig Dispense Refill  . BIOTIN PO Take 1 tablet by mouth daily.    . calcium carbonate (OS-CAL - DOSED IN MG OF ELEMENTAL CALCIUM) 1250 (500 Ca) MG tablet Take 1 tablet by mouth daily.    . cholecalciferol (VITAMIN D) 1000 units tablet Take 2,000 Units by mouth daily.    . cyanocobalamin 500 MCG tablet Take 500 mcg by mouth daily.    Marland Kitchen escitalopram (LEXAPRO) 5 MG tablet TAKE 1 TABLET BY MOUTH EVERY DAY 90 tablet 1  . furosemide (LASIX) 20 MG tablet TAKE 1 TABLET BY MOUTH EVERY DAY 90 tablet 1  . Teriflunomide (AUBAGIO) 14 MG TABS Take 14 mg by mouth daily.    Marland Kitchen zolpidem (AMBIEN) 10 MG tablet Take 0.5-1 tablets (5-10 mg total) by mouth at bedtime as needed for sleep. 90 tablet 0  . albuterol (PROAIR HFA) 108 (90 Base) MCG/ACT inhaler Inhale 2 puffs into the lungs every 6 (six) hours as needed for wheezing or shortness of breath. (Patient not taking: Reported on 07/11/2018) 8 g 3   No current facility-administered medications on file prior to visit.     BP 106/70   Pulse (!) 52   Temp 98.2 F (36.8 C) (Oral)   Ht 5\' 6"  (1.676 m)   Wt 124 lb 8 oz (56.5 kg)   SpO2 99%   BMI 20.09 kg/m    Objective:   Physical Exam  Constitutional: She appears well-nourished.  Neck: Neck supple.  Cardiovascular: Normal rate.  Respiratory: Effort normal.  Musculoskeletal:       Legs: Pain with extension of left hamstring, also with taking larger steps. No weakness. 5/5 strength bilaterally.   Skin: Skin is warm and dry.  Psychiatric: She has a normal mood and affect.           Assessment & Plan:  Muscle  Strain:  Located to left hamstring x several weeks with re-injury one week ago. Suspect strain without significant tear given exam and HPI.  Discussed PRICE for treatment. Rx  for Naproxen course BID x 1 week sent to pharmacy. Handouts provided regarding stretching. She will see sports medicine in 2 weeks if no improvement.  Pleas Koch, NP

## 2018-07-15 ENCOUNTER — Ambulatory Visit: Payer: PPO

## 2018-07-16 ENCOUNTER — Encounter: Payer: Self-pay | Admitting: Family Medicine

## 2018-07-16 ENCOUNTER — Other Ambulatory Visit (INDEPENDENT_AMBULATORY_CARE_PROVIDER_SITE_OTHER): Payer: PPO

## 2018-07-16 ENCOUNTER — Ambulatory Visit (INDEPENDENT_AMBULATORY_CARE_PROVIDER_SITE_OTHER): Payer: PPO | Admitting: Family Medicine

## 2018-07-16 VITALS — BP 119/75 | HR 54 | Temp 97.5°F | Ht 66.0 in | Wt 126.5 lb

## 2018-07-16 DIAGNOSIS — G35 Multiple sclerosis: Secondary | ICD-10-CM | POA: Diagnosis not present

## 2018-07-16 DIAGNOSIS — S76312A Strain of muscle, fascia and tendon of the posterior muscle group at thigh level, left thigh, initial encounter: Secondary | ICD-10-CM

## 2018-07-16 NOTE — Progress Notes (Signed)
Dr. Frederico Hamman T. Dwyane Dupree, MD, [facility] [place], [address] Phone: [phone] Fax: [phone]  07/16/2018  Patient: Morgan Herrera, MRN: [id number], DOB: 12-13-1950, 67 y.o.  Primary Physician:  Pleas Koch, NP   Chief Complaint  Patient presents with  . Strain of Left Hamstring   Subjective:   Morgan Herrera is a 67 y.o. very pleasant female patient who presents with the following:  L hamstring strain + reinjury  07/11/2018 OV with Allie Bossier  pULLED A FEW WEEKS AGO AND AND FELT PRETTY GOOD.  Initially, she was in yoga and felt a posterior pop, but she never had any bruising and no defect. The pain was in the proximal HS and medially. WAS ON  A CRUISE, AND WEARING A PAIR OF and felt something tear. Limping and barely walking. This had been improving with basic change in activity, no yogga, OTC remedies and an altered knee brace for support.   Past Medical History, Surgical History, Social History, Family History, Problem List, Medications, and Allergies have been reviewed and updated if relevant.  Patient Active Problem List   Diagnosis Date Noted  . Chronic diastolic heart failure ([place]) 05/07/2018  . Unstable angina ([place])   . PMR (polymyalgia rheumatica) ([facility]) 04/23/2018  . Acute CHF (congestive heart failure) ([place]) 04/23/2018  . Welcome to Medicare preventive visit 04/10/2017  . Constipation 03/19/2016  . Relapsing remitting multiple sclerosis ([place]) 12/13/2015  . Insomnia 09/19/2015  . Preventative health care 03/04/2015  . Vitamin D deficiency 03/04/2015  . Multiple sclerosis ([place]) 02/11/2015  . Depression 02/11/2015    Past Medical History:  Diagnosis Date  . (HFpEF) heart failure with preserved ejection fraction ([place])    a. 03/2018 Echo: EF 55-60%, no rwma. Mod MR. Nl PASP.  Marland Kitchen Chest pain    a. 03/2018 Ex MV: rest only imaging showed ant wall perfusion defect. No stress imaging as pt  developed hypotension w/ exercise; b. 05/2018 Cath: Nl cors w/ sluggish flow in teh LAD suggestive of endothelial dysfxn. EF 55-65%.  . Migraine   . Multiple sclerosis ([place])   . Neuromuscular disorder ([place])   . PMR (polymyalgia rheumatica) ([facility])   . Pulmonary nodule    a. 03/2018 CTA Chest: 86mm small spiculated nodular density @ the L lung apex. Rec f/u in 3 mos.    Past Surgical History:  Procedure Laterality Date  . BREAST EXCISIONAL BIOPSY    . BREAST SURGERY  1990  . COLONOSCOPY WITH PROPOFOL N/A 05/19/2015   Procedure: COLONOSCOPY WITH PROPOFOL;  Surgeon: Hulen Luster, MD;  Location: [facility];  Service: Gastroenterology;  Laterality: N/A;  . EYE SURGERY     1978 1st of 3 eye surgeries  . LEFT HEART CATH AND CORONARY ANGIOGRAPHY N/A 04/25/2018   Procedure: LEFT HEART CATH AND CORONARY ANGIOGRAPHY;  Surgeon: Wellington Hampshire, MD;  Location: [facility];  Service: Cardiovascular;  Laterality: N/A;  . TENDON REPAIR      Social History   Socioeconomic History  . Marital status: Married    Spouse name: Not on file  . Number of children: Not on file  . Years of education: Not on file  . Highest education level: Not on file  Occupational History  . Not on file  Social Needs  . Financial resource strain: Not on file  . Food insecurity:    Worry: Not on file    Inability: Not on  file  . Transportation needs:    Medical: Not on file    Non-medical: Not on file  Tobacco Use  . Smoking status: Former Research scientist (life sciences)  . Smokeless tobacco: Never Used  Substance and Sexual Activity  . Alcohol use: Yes    Alcohol/week: 0.0 standard drinks    Comment: 1 to 2 glass of wine at least 4 nights a week  . Drug use: No  . Sexual activity: Not on file  Lifestyle  . Physical activity:    Days per week: Not on file    Minutes per session: Not on file  . Stress: Not on file  Relationships  . Social connections:    Talks on phone: Not on file    Gets together: Not on file    Attends  religious service: Not on file    Active member of club or organization: Not on file    Attends meetings of clubs or organizations: Not on file    Relationship status: Not on file  . Intimate partner violence:    Fear of current or ex partner: Not on file    Emotionally abused: Not on file    Physically abused: Not on file    Forced sexual activity: Not on file  Other Topics Concern  . Not on file  Social History Narrative   Married.   Moved here from [place] after 35 years   Enjoys arts and crafts. Taking a painting class.   Walking with her spouse.    Reading, shopping.    Family History  Problem Relation Age of Onset  . Heart disease Mother   . Hypertension Mother   . Parkinson's disease Mother   . Parkinsonism Mother   . Cancer Maternal Grandmother        breast  . Breast cancer Maternal Grandmother   . Asthma Father     No Known Allergies  Medication list reviewed and updated in full in [place].  GEN: No fevers, chills. Nontoxic. Primarily MSK c/o today. MSK: Detailed in the HPI GI: tolerating PO intake without difficulty Neuro: No numbness, parasthesias, or tingling associated. Otherwise the pertinent positives of the ROS are noted above.   Objective:   BP 119/75   Pulse (!) 54   Temp (!) 97.5 F (36.4 C) (Oral)   Ht 5\' 6"  (1.676 m)   Wt 126 lb 8 oz (57.4 kg)   BMI 20.42 kg/m    GEN: WDWN, NAD, Non-toxic, Alert & Oriented x 3 HEENT: Atraumatic, Normocephalic.  Ears and Nose: No external deformity. EXTR: No clubbing/cyanosis/edema NEURO: Normal gait.  PSYCH: Normally interactive. Conversant. Not depressed or anxious appearing.  Calm demeanor.   Knee:  L Gait: Normal heel toe pattern ROM: 0-130 Effusion: neg Echymosis or edema: none Patellar tendon NT Painful PLICA: neg Patellar grind: negative Medial and lateral patellar facet loading: negative medial and lateral joint lines:NT Mcmurray's neg Flexion-pinch neg Varus and valgus  stress: stable Lachman: neg Ant and Post drawer: neg Hip abduction, IR, ER: WNL Hip flexion str: 5/5 Hip abd: 5/5 Quad: 5/5 VMO atrophy:No Hamstring concentric and eccentric: 4+/5  3+ in eccentric, and more pain in semimem/ten isolation  No defect, no bruising  HIP ROM completely normal in ABD, IROM AND EROM  Radiology: No results found.  Assessment and Plan:   Left hamstring muscle strain, initial encounter  Grade 1 semimembranosis or semitendinosis tear, proximal  No defects, str is appropriate for time I expect she resumed activity  a bit too early  Compression thigh sleeve with activity Resumption of HS rehab No yoga at least 3 weeks, needs to be completely pain free before resuming  Patient Instructions  Hamstring walking program: comfortable pace 2.8-4.0 mph, start 0, increase every 2 mins by 3 degrees, 0-3-03-10-11-15-12-9-6-3-0, repeat until 30 mins complete.  It is OK to do bicycle riding, elliptical, etc. Hold off on running until OK'd by MD.  Try to do this workout 3 times a week or more in addition to above.    Hamstring Rehab 1)  HS curls - start with 3 sets of 15 and progress to 3 sets of 30 every 3 days 2)  HS curls with weight - begin with 2 lb ankle weight when above is easy and start with 3 sets of 10; increasing every 5 days by 5 reps - eg to 3 sets of 15 reps 3)  HS running lunges - running lunge position means no more than 45 degrees of knee flexion and running motion.  follow same schedule as above.     Follow-up: prn if not better 1 mo  Signed,  Fahad Cisse T. Anacarolina Evelyn, MD   Allergies as of 07/16/2018   No Known Allergies     Medication List        Accurate as of 07/16/18 11:59 PM. Always use your most recent med list.          albuterol 108 (90 Base) MCG/ACT inhaler Commonly known as:  PROVENTIL HFA;VENTOLIN HFA Inhale 2 puffs into the lungs every 6 (six) hours as needed for wheezing or shortness of breath.   AUBAGIO 14 MG  Tabs Generic drug:  Teriflunomide Take 14 mg by mouth daily.   BIOTIN PO Take 1 tablet by mouth daily.   calcium carbonate 1250 (500 Ca) MG tablet Commonly known as:  OS-CAL - dosed in mg of elemental calcium Take 1 tablet by mouth daily.   cholecalciferol 1000 units tablet Commonly known as:  VITAMIN D Take 2,000 Units by mouth daily.   escitalopram 5 MG tablet Commonly known as:  LEXAPRO TAKE 1 TABLET BY MOUTH EVERY DAY   furosemide 20 MG tablet Commonly known as:  LASIX TAKE 1 TABLET BY MOUTH EVERY DAY   naproxen 500 MG tablet Commonly known as:  NAPROSYN Take 1 tablet (500 mg total) by mouth 2 (two) times daily with a meal.   vitamin B-12 500 MCG tablet Commonly known as:  CYANOCOBALAMIN Take 500 mcg by mouth daily.   zolpidem 10 MG tablet Commonly known as:  AMBIEN Take 0.5-1 tablets (5-10 mg total) by mouth at bedtime as needed for sleep.

## 2018-07-16 NOTE — Patient Instructions (Signed)
Hamstring walking program: comfortable pace 2.8-4.0 mph, start 0, increase every 2 mins by 3 degrees, 0-3-03-10-11-15-12-9-6-3-0, repeat until 30 mins complete.  It is OK to do bicycle riding, elliptical, etc. Hold off on running until OK'd by MD.  Try to do this workout 3 times a week or more in addition to above.    Hamstring Rehab 1)  HS curls - start with 3 sets of 15 and progress to 3 sets of 30 every 3 days 2)  HS curls with weight - begin with 2 lb ankle weight when above is easy and start with 3 sets of 10; increasing every 5 days by 5 reps - eg to 3 sets of 15 reps 3)  HS running lunges - running lunge position means no more than 45 degrees of knee flexion and running motion.  follow same schedule as above.

## 2018-07-17 ENCOUNTER — Ambulatory Visit
Admission: RE | Admit: 2018-07-17 | Discharge: 2018-07-17 | Disposition: A | Discharge status: Home / Self Care | Payer: PPO | Source: Ambulatory Visit | Attending: Internal Medicine | Admitting: Internal Medicine

## 2018-07-17 ENCOUNTER — Ambulatory Visit (HOSPITAL_COMMUNITY): Payer: PPO

## 2018-07-17 DIAGNOSIS — R911 Solitary pulmonary nodule: Secondary | ICD-10-CM

## 2018-07-17 DIAGNOSIS — R918 Other nonspecific abnormal finding of lung field: Secondary | ICD-10-CM | POA: Diagnosis not present

## 2018-07-17 DIAGNOSIS — I700 Atherosclerosis of aorta: Secondary | ICD-10-CM | POA: Diagnosis not present

## 2018-07-17 DIAGNOSIS — J9811 Atelectasis: Secondary | ICD-10-CM | POA: Diagnosis not present

## 2018-07-17 LAB — CBC WITH DIFFERENTIAL
BASOS: 2 %
Basophils Absolute: 0.1 10*3/uL (ref 0.0–0.2)
EOS (ABSOLUTE): 0.2 10*3/uL (ref 0.0–0.4)
EOS: 5 %
HEMATOCRIT: 37 % (ref 34.0–46.6)
Hemoglobin: 12.3 g/dL (ref 11.1–15.9)
IMMATURE GRANS (ABS): 0 10*3/uL (ref 0.0–0.1)
Immature Granulocytes: 0 %
LYMPHS: 24 %
Lymphocytes Absolute: 1.2 10*3/uL (ref 0.7–3.1)
MCH: 28.7 pg (ref 26.6–33.0)
MCHC: 33.2 g/dL (ref 31.5–35.7)
MCV: 86 fL (ref 79–97)
Monocytes Absolute: 0.6 10*3/uL (ref 0.1–0.9)
Monocytes: 12 %
NEUTROS ABS: 2.8 10*3/uL (ref 1.4–7.0)
Neutrophils: 57 %
RBC: 4.28 x10E6/uL (ref 3.77–5.28)
RDW: 14.5 % (ref 12.3–15.4)
WBC: 4.9 10*3/uL (ref 3.4–10.8)

## 2018-07-17 MED ORDER — ALBUTEROL SULFATE (2.5 MG/3ML) 0.083% IN NEBU
2.5000 mg | INHALATION_SOLUTION | Freq: Once | RESPIRATORY_TRACT | Status: AC
  Administered 2018-07-17: 2.5 mg via RESPIRATORY_TRACT
  Filled 2018-07-17: qty 3

## 2018-07-17 NOTE — Progress Notes (Addendum)
NEUROLOGY FOLLOW UP OFFICE NOTE  Morgan Herrera [id number]  HISTORY OF PRESENT ILLNESS: Morgan Herrera is a 67 year old right-handed female with multiple sclerosis, polymyalgia rheumatica and migraine who follows up for multiple sclerosis.  UPDATE: Current medications:  Aubagio, B12 109mcg daily, D3 2000 IU daily.  Vitamin D level from 12/31/17 was 60.31.  She was admitted to [facility] in July for atypical chest pain and shortness of breath due to mild CHF but ultimately thought to be due to atypical pneumonia.  She was found to have elevated liver enzymes:  t bili 0.5, ALP 205, AST 127, ALT 153 on 7/24.  Then 5 bili 0.5, ALP 201, AST 82, ALT 120 on 7/26.  She was advised to discontinue the Aubagio.  I recommended remaining on it and rechecking in a month.  Labs from 05/15/18 showed t bili 0.2, ALP 107, AST 19 and ALT 17.  Repeat labs from 05/28/18 demonstrated t bili 0.7, ALP 61, AST 25, ALT 24.  Labs from 2 days ago demonstrated CBC with diff WBC 4.9 with ALC 1.2 x10E3/uL She still gets short of breath with exertion now.    Vision:  Chronic right sided peripheral vision loss Motor:  No issues Sensory:  No issues Pain:  With prolonged sitting, she experiences leg spasms that cause her to move her legs that helps relieve it.  This started in early summer.  No burning or tingling.  It does not occur while in bed, only when sitting.  No back pain.  However neck and shoulders tight since the pneumonia.  She also pulled her left hamstring. Gait:  Overall stable. Bowel/Bladder:  No issues Fatigue:  Yes, since hospitalization Cognition:  No issues Mood:  No issues  HISTORY: She was diagnosed with MS at age 71, when she presented with bilateral optic neuritis, right worse than left.  She was treated with IV steroids at that time..  In hindsight, she recalls brief episodes from her past.  In her late-20s, she had a 3 week episode of dizziness and ataxia.  She had brief episodes of limb  paresthesias lasting a couple of days.  Work up included MRIs.  MRIs of the brain have shown lesions suspicious for MS.  Prior cervical MRI reportedly normal.  She never had a relapse and therefore never required further IV steroids.  However, she was diagnosed with polymyalgia rheumatica several years ago, after experiencing severe joint pain and stiffness.    She occasionally has fatigue once in a while, sometimes lasting for 2 weeks.   She does not exhibit Lhermitte's sign.  She reports burning and aching on the bottom of her feet when she walks for prolonged period.  She works part-time as a Radiation protection practitioner.  For the past couple of years, she reports word-finding difficulty.  Sometimes it is difficult to concentrate.  It has almost affected her work at times.  She has a BA in [place].  She underwent neuropsychological testing in July 2017, which revealed anxiety and depression but no cognitive impairment.  Prior disease modifying drugs:  Avonex (tired of injections), Tecfidera (nausea, flushing and unable to take aspirin)  09/06/15:  MRI of brain with and without contrast showed chronic non-enhancing T2/FLAIR hyperintensities in the periventricular white matter, some demonstrating Dawson's fingers, with mild corpus callosum involvement, which appears stable compared to 2012 and similar to imaging from 2005.   10/09/16:  MRI of brain with and without contrast was stable. 12/28/17:  MRI of brain with and without contrast  was personally reviewed and is stable compared to prior imaging from 2016.   PAST MEDICAL HISTORY: Past Medical History:  Diagnosis Date  . (HFpEF) heart failure with preserved ejection fraction ([place])    a. 03/2018 Echo: EF 55-60%, no rwma. Mod MR. Nl PASP.  Marland Kitchen Chest pain    a. 03/2018 Ex MV: rest only imaging showed ant wall perfusion defect. No stress imaging as pt developed hypotension w/ exercise; b. 05/2018 Cath: Nl cors w/ sluggish flow in teh LAD suggestive of endothelial  dysfxn. EF 55-65%.  . Migraine   . Multiple sclerosis ([place])   . Neuromuscular disorder ([place])   . PMR (polymyalgia rheumatica) ([facility])   . Pulmonary nodule    a. 03/2018 CTA Chest: 65mm small spiculated nodular density @ the L lung apex. Rec f/u in 3 mos.    MEDICATIONS: Current Outpatient Medications on File Prior to Visit  Medication Sig Dispense Refill  . albuterol (PROAIR HFA) 108 (90 Base) MCG/ACT inhaler Inhale 2 puffs into the lungs every 6 (six) hours as needed for wheezing or shortness of breath. 8 g 3  . BIOTIN PO Take 1 tablet by mouth daily.    . calcium carbonate (OS-CAL - DOSED IN MG OF ELEMENTAL CALCIUM) 1250 (500 Ca) MG tablet Take 1 tablet by mouth daily.    . cholecalciferol (VITAMIN D) 1000 units tablet Take 2,000 Units by mouth daily.    . cyanocobalamin 500 MCG tablet Take 500 mcg by mouth daily.    Marland Kitchen escitalopram (LEXAPRO) 5 MG tablet TAKE 1 TABLET BY MOUTH EVERY DAY 90 tablet 1  . furosemide (LASIX) 20 MG tablet TAKE 1 TABLET BY MOUTH EVERY DAY 90 tablet 1  . naproxen (NAPROSYN) 500 MG tablet Take 1 tablet (500 mg total) by mouth 2 (two) times daily with a meal. 14 tablet 0  . Teriflunomide (AUBAGIO) 14 MG TABS Take 14 mg by mouth daily.    Marland Kitchen zolpidem (AMBIEN) 10 MG tablet Take 0.5-1 tablets (5-10 mg total) by mouth at bedtime as needed for sleep. 90 tablet 0   No current facility-administered medications on file prior to visit.     ALLERGIES: No Known Allergies  FAMILY HISTORY: Family History  Problem Relation Age of Onset  . Heart disease Mother   . Hypertension Mother   . Parkinson's disease Mother   . Parkinsonism Mother   . Cancer Maternal Grandmother        breast  . Breast cancer Maternal Grandmother   . Asthma Father     SOCIAL HISTORY: Social History   Socioeconomic History  . Marital status: Married    Spouse name: Not on file  . Number of children: Not on file  . Years of education: Not on file  . Highest education level: Not on file    Occupational History  . Not on file  Social Needs  . Financial resource strain: Not on file  . Food insecurity:    Worry: Not on file    Inability: Not on file  . Transportation needs:    Medical: Not on file    Non-medical: Not on file  Tobacco Use  . Smoking status: Former Research scientist (life sciences)  . Smokeless tobacco: Never Used  Substance and Sexual Activity  . Alcohol use: Yes    Alcohol/week: 0.0 standard drinks    Comment: 1 to 2 glass of wine at least 4 nights a week  . Drug use: No  . Sexual activity: Not on file  Lifestyle  .  Physical activity:    Days per week: Not on file    Minutes per session: Not on file  . Stress: Not on file  Relationships  . Social connections:    Talks on phone: Not on file    Gets together: Not on file    Attends religious service: Not on file    Active member of club or organization: Not on file    Attends meetings of clubs or organizations: Not on file    Relationship status: Not on file  . Intimate partner violence:    Fear of current or ex partner: Not on file    Emotionally abused: Not on file    Physically abused: Not on file    Forced sexual activity: Not on file  Other Topics Concern  . Not on file  Social History Narrative   Married.   Moved here from [place] after 35 years   Enjoys arts and crafts. Taking a painting class.   Walking with her spouse.    Reading, shopping.    REVIEW OF SYSTEMS: Constitutional: No fevers, chills, or sweats, no generalized fatigue, change in appetite Eyes: No visual changes, double vision, eye pain Ear, nose and throat: No hearing loss, ear pain, nasal congestion, sore throat Cardiovascular: No chest pain, palpitations Respiratory:  No shortness of breath at rest or with exertion, wheezes GastrointestinaI: No nausea, vomiting, diarrhea, abdominal pain, fecal incontinence Genitourinary:  No dysuria, urinary retention or frequency Musculoskeletal:  No neck pain, back pain Integumentary: No rash,  pruritus, skin lesions Neurological: as above Psychiatric: No depression, insomnia, anxiety Endocrine: No palpitations, fatigue, diaphoresis, mood swings, change in appetite, change in weight, increased thirst Hematologic/Lymphatic:  No purpura, petechiae. Allergic/Immunologic: no itchy/runny eyes, nasal congestion, recent allergic reactions, rashes  PHYSICAL EXAM: Blood pressure 118/86, pulse 77, height 5\' 6"  (1.676 m), weight 126 lb 2 oz (57.2 kg). General: No acute distress.  Patient appears well-groomed.  normal body habitus. Head:  Normocephalic/atraumatic Eyes:  Fundi examined but not visualized Neck: supple, no paraspinal tenderness, full range of motion Heart:  Regular rate and rhythm Lungs:  Clear to auscultation bilaterally Back: No paraspinal tenderness Neurological Exam: alert and oriented to person, place, and time. Attention span and concentration intact, recent and remote memory intact, fund of knowledge intact.  Speech fluent and not dysarthric, language intact.  Peripheral vision loss in right eye.  Otherwise, CN II-XII intact. Bulk and tone normal, muscle strength 5-/5 deltoids (secondary to chronic pain), otherwise 5/5 throughout.  Sensation to light touch, temperature and vibration intact.  Deep tendon reflexes 2+ in upper extremities, 3+ in lower extremities, toes downgoing.  Finger to nose and heel to shin testing intact.  Gait normal, Romberg negative.  Timed 25 foot walk 4.61 seconds.   IMPRESSION: 1.  Multiple sclerosis 2.  Fatigue, likely secondary to prolonged recovery from pneumonia (not MS-related) 3.  Muscle spasms in legs/restless leg-type symptoms 4.  Elevated liver enzymes.  Resolved after one month.  Aubagio not suspected.  PLAN: 1.  Continue Aubagio, D3, B12 2.  For spasms, start gabapentin 100mg  at bedtime.  We can increase dose to 200mg  at bedtime if needed. 3.  Check CMP, vitamin D, Mg, CK and ferritin level today 4.  Repeat CBC with diff, CMP and  vitamin D in 6 months 5.  Follow up in 6 months (after repeat labs)  25 minutes spent face to face with patient, over 50% spent discussing management.  Metta Clines, DO  CC:  Alma Friendly, NP

## 2018-07-18 ENCOUNTER — Encounter: Payer: Self-pay | Admitting: Neurology

## 2018-07-18 ENCOUNTER — Other Ambulatory Visit (INDEPENDENT_AMBULATORY_CARE_PROVIDER_SITE_OTHER): Payer: PPO

## 2018-07-18 ENCOUNTER — Ambulatory Visit (INDEPENDENT_AMBULATORY_CARE_PROVIDER_SITE_OTHER): Payer: PPO | Admitting: Neurology

## 2018-07-18 VITALS — BP 118/86 | HR 77 | Ht 66.0 in | Wt 126.1 lb

## 2018-07-18 DIAGNOSIS — G35 Multiple sclerosis: Secondary | ICD-10-CM

## 2018-07-18 DIAGNOSIS — E559 Vitamin D deficiency, unspecified: Secondary | ICD-10-CM | POA: Diagnosis not present

## 2018-07-18 DIAGNOSIS — Z79899 Other long term (current) drug therapy: Secondary | ICD-10-CM

## 2018-07-18 DIAGNOSIS — G35D Multiple sclerosis, unspecified: Secondary | ICD-10-CM

## 2018-07-18 DIAGNOSIS — M62838 Other muscle spasm: Secondary | ICD-10-CM | POA: Diagnosis not present

## 2018-07-18 DIAGNOSIS — R748 Abnormal levels of other serum enzymes: Secondary | ICD-10-CM | POA: Diagnosis not present

## 2018-07-18 DIAGNOSIS — R5383 Other fatigue: Secondary | ICD-10-CM

## 2018-07-18 DIAGNOSIS — G35A Relapsing-remitting multiple sclerosis: Secondary | ICD-10-CM

## 2018-07-18 LAB — VITAMIN D 25 HYDROXY (VIT D DEFICIENCY, FRACTURES): VITD: 82.96 ng/mL (ref 30.00–100.00)

## 2018-07-18 MED ORDER — GABAPENTIN 100 MG PO CAPS: 100.0000 mg | ORAL_CAPSULE | Freq: Every day | ORAL | 5 refills | Status: DC

## 2018-07-18 NOTE — Patient Instructions (Addendum)
1.  For spasms, take gabapentin 100mg  at bedtime 2.  We will check CMP, vitamin D, Mg, CK, and ferritin level  Your provider requests that you have LABS drawn today.  We share a lab with [facility] - they are located in [address] (second floor) of this building.  Once you get there, please have a seat and the phlebotomist will call your name.  If you have waited more than 15 minutes, please advise the front desk   3.  Repeat CBC with diff, CMP and vitamin D in 6 months.  Your provider has requested that you have LABS drawn in the FUTURE.  Our laboratory is located within [facility], [phone] of this building.  You do not need an appointment however, please come to [facility] prior to going to the lab - we will check you into the lab.     4.  Follow up in 6 months (after repeat labs)

## 2018-07-19 LAB — COMPREHENSIVE METABOLIC PANEL
AG RATIO: 2.1 (calc) (ref 1.0–2.5)
ALKALINE PHOSPHATASE (APISO): 48 U/L (ref 33–130)
ALT: 32 U/L — AB (ref 6–29)
AST: 31 U/L (ref 10–35)
Albumin: 4.4 g/dL (ref 3.6–5.1)
BILIRUBIN TOTAL: 0.4 mg/dL (ref 0.2–1.2)
BUN/Creatinine Ratio: 38 (calc) — ABNORMAL HIGH (ref 6–22)
BUN: 31 mg/dL — AB (ref 7–25)
CALCIUM: 9.6 mg/dL (ref 8.6–10.4)
CO2: 27 mmol/L (ref 20–32)
Chloride: 104 mmol/L (ref 98–110)
Creat: 0.82 mg/dL (ref 0.50–0.99)
Globulin: 2.1 g/dL (calc) (ref 1.9–3.7)
Glucose, Bld: 91 mg/dL (ref 65–99)
Potassium: 4.8 mmol/L (ref 3.5–5.3)
Sodium: 140 mmol/L (ref 135–146)
Total Protein: 6.5 g/dL (ref 6.1–8.1)

## 2018-07-19 LAB — CBC WITH DIFFERENTIAL/PLATELET
BASOS PCT: 1.8 %
Basophils Absolute: 90 cells/uL (ref 0–200)
EOS PCT: 3.8 %
Eosinophils Absolute: 190 cells/uL (ref 15–500)
HEMATOCRIT: 36.4 % (ref 35.0–45.0)
HEMOGLOBIN: 12 g/dL (ref 11.7–15.5)
LYMPHS ABS: 1075 {cells}/uL (ref 850–3900)
MCH: 28 pg (ref 27.0–33.0)
MCHC: 33 g/dL (ref 32.0–36.0)
MCV: 85 fL (ref 80.0–100.0)
MPV: 11.1 fL (ref 7.5–12.5)
Monocytes Relative: 11.8 %
NEUTROS ABS: 3055 {cells}/uL (ref 1500–7800)
NEUTROS PCT: 61.1 %
Platelets: 261 10*3/uL (ref 140–400)
RBC: 4.28 10*6/uL (ref 3.80–5.10)
RDW: 14.2 % (ref 11.0–15.0)
Total Lymphocyte: 21.5 %
WBC: 5 10*3/uL (ref 3.8–10.8)
WBCMIX: 590 {cells}/uL (ref 200–950)

## 2018-07-19 LAB — CK: CK TOTAL: 89 U/L (ref 29–143)

## 2018-07-19 LAB — FERRITIN: Ferritin: 80 ng/mL (ref 16–288)

## 2018-07-19 LAB — MAGNESIUM: MAGNESIUM: 1.9 mg/dL (ref 1.5–2.5)

## 2018-07-22 ENCOUNTER — Telehealth: Payer: Self-pay | Admitting: Neurology

## 2018-07-22 DIAGNOSIS — M62838 Other muscle spasm: Secondary | ICD-10-CM

## 2018-07-22 NOTE — Telephone Encounter (Signed)
Patient stopped by the office needing to check the status of the prescription for Gabapentin. She was told by Dr. Tomi Likens that he would have that called in at [facility] in [place]. She would like a 90 Day Supply. Thanks

## 2018-07-23 ENCOUNTER — Telehealth: Payer: Self-pay

## 2018-07-23 MED ORDER — GABAPENTIN 100 MG PO CAPS: 100.0000 mg | ORAL_CAPSULE | Freq: Every day | ORAL | 3 refills | Status: DC

## 2018-07-23 NOTE — Telephone Encounter (Signed)
Called and advised Pt 

## 2018-07-23 NOTE — Telephone Encounter (Signed)
-----   Message from Pieter Partridge, DO sent at 07/21/2018  2:33 PM EDT ----- Labs look okay.  One of the liver enzymes is borderline elevated but nothing concerning at all.  We will repeat routine labs in 6 months as already scheduled.

## 2018-07-29 ENCOUNTER — Encounter: Payer: Self-pay | Admitting: Cardiovascular Disease

## 2018-07-29 ENCOUNTER — Ambulatory Visit (INDEPENDENT_AMBULATORY_CARE_PROVIDER_SITE_OTHER): Payer: PPO | Admitting: Cardiovascular Disease

## 2018-07-29 VITALS — BP 102/64 | HR 54 | Ht 66.0 in | Wt 125.0 lb

## 2018-07-29 DIAGNOSIS — R001 Bradycardia, unspecified: Secondary | ICD-10-CM | POA: Diagnosis not present

## 2018-07-29 DIAGNOSIS — G35 Multiple sclerosis: Secondary | ICD-10-CM

## 2018-07-29 DIAGNOSIS — I5032 Chronic diastolic (congestive) heart failure: Secondary | ICD-10-CM | POA: Diagnosis not present

## 2018-07-29 DIAGNOSIS — I998 Other disorder of circulatory system: Secondary | ICD-10-CM

## 2018-07-29 DIAGNOSIS — I2510 Atherosclerotic heart disease of native coronary artery without angina pectoris: Secondary | ICD-10-CM

## 2018-07-29 NOTE — Progress Notes (Signed)
Cardiology Office Note   Date:  07/29/2018   ID:  Morgan Herrera, DOB [birth year], MRN [id number]  PCP:  Pleas Koch, NP  Cardiologist:   Kathlyn Sacramento, MD   Chief Complaint  Patient presents with  . other    f/u visit ..pt complains of SOB, reviewed meds verbally with pt      History of Present Illness: JETTE Herrera is a 67 y.o. female who presents for a follow-up visit regarding chest pain due to suspected endothelial dysfunction.  She has known history of multiple sclerosis, optic neuritis, polymyalgia rheumatica, remote tobacco use and migraines. The patient was hospitalized in July with fatigue, shortness of breath and chest pain.  D-dimer was elevated.  CTA showed no evidence of pulmonary embolism.  BNP was mildly elevated.  Echocardiogram was unremarkable.  Stress test was aborted due to hypertensive response with exercise.  Cardiac catheterization was performed which showed normal coronary arteries with sluggish flow in the LAD suggestive of endothelial dysfunction.  EF was normal.  LVEDP was mildly elevated. She did not tolerate long-acting nitroglycerin due to headaches.  She has been doing well with no recent chest pain and stable exertional dyspnea.  She has been taking furosemide 20 mg daily for a long time for fluid retention and leg edema.  Past Medical History:  Diagnosis Date  . (HFpEF) heart failure with preserved ejection fraction ([place])    a. 03/2018 Echo: EF 55-60%, no rwma. Mod MR. Nl PASP.  Marland Kitchen Chest pain    a. 03/2018 Ex MV: rest only imaging showed ant wall perfusion defect. No stress imaging as pt developed hypotension w/ exercise; b. 05/2018 Cath: Nl cors w/ sluggish flow in teh LAD suggestive of endothelial dysfxn. EF 55-65%.  . Migraine   . Multiple sclerosis ([place])   . Neuromuscular disorder ([place])   . PMR (polymyalgia rheumatica) (H[facility])   . Pulmonary nodule    a. 03/2018 CTA Chest: 25mm small spiculated nodular density @ the L lung apex. Rec  f/u in 3 mos.    Past Surgical History:  Procedure Laterality Date  . BREAST EXCISIONAL BIOPSY    . BREAST SURGERY  1990  . COLONOSCOPY WITH PROPOFOL N/A 05/19/2015   Procedure: COLONOSCOPY WITH PROPOFOL;  Surgeon: Hulen Luster, MD;  Location: [facility] ENDOSCOPY;  Service: Gastroenterology;  Laterality: N/A;  . EYE SURGERY     1978 1st of 3 eye surgeries  . LEFT HEART CATH AND CORONARY ANGIOGRAPHY N/A 04/25/2018   Procedure: LEFT HEART CATH AND CORONARY ANGIOGRAPHY;  Surgeon: Wellington Hampshire, MD;  Location: [facility] LAB;  Service: Cardiovascular;  Laterality: N/A;  . TENDON REPAIR       Current Outpatient Medications  Medication Sig Dispense Refill  . albuterol (PROAIR HFA) 108 (90 Base) MCG/ACT inhaler Inhale 2 puffs into the lungs every 6 (six) hours as needed for wheezing or shortness of breath. 8 g 3  . BIOTIN PO Take 1 tablet by mouth daily.    . calcium carbonate (OS-CAL - DOSED IN MG OF ELEMENTAL CALCIUM) 1250 (500 Ca) MG tablet Take 1 tablet by mouth daily.    . cholecalciferol (VITAMIN D) 1000 units tablet Take 2,000 Units by mouth daily.    . cyanocobalamin 500 MCG tablet Take 500 mcg by mouth daily.    Marland Kitchen escitalopram (LEXAPRO) 5 MG tablet TAKE 1 TABLET BY MOUTH EVERY DAY 90 tablet 1  . furosemide (LASIX) 20 MG tablet TAKE 1 TABLET BY MOUTH  EVERY DAY 90 tablet 1  . gabapentin (NEURONTIN) 100 MG capsule Take 1 capsule (100 mg total) by mouth at bedtime. 90 capsule 3  . Teriflunomide (AUBAGIO) 14 MG TABS Take 14 mg by mouth daily.    Marland Kitchen zolpidem (AMBIEN) 10 MG tablet Take 0.5-1 tablets (5-10 mg total) by mouth at bedtime as needed for sleep. 90 tablet 0   No current facility-administered medications for this visit.     Allergies:   Patient has no known allergies.    Social History:  The patient  reports that she has quit smoking. She has never used smokeless tobacco. She reports that she drinks alcohol. She reports that she does not use drugs.   Family History:  The  patient's family history includes Asthma in her father; Breast cancer in her maternal grandmother; Cancer in her maternal grandmother; Heart disease in her mother; Hypertension in her mother; Parkinson's disease in her mother; Parkinsonism in her mother.    ROS:  Please see the history of present illness.   Otherwise, review of systems are positive for none.   All other systems are reviewed and negative.    PHYSICAL EXAM: VS:  BP 102/64   Pulse (!) 54   Ht 5\' 6"  (1.676 m)   Wt 125 lb (56.7 kg)   SpO2 94%   BMI 20.18 kg/m  , BMI Body mass index is 20.18 kg/m. GEN: Well nourished, well developed, in no acute distress  HEENT: normal  Neck: no JVD, carotid bruits, or masses Cardiac: RRR; no  rubs, or gallops,no edema .  1 out of 6 systolic murmur at the aortic area. Respiratory:  clear to auscultation bilaterally, normal work of breathing GI: soft, nontender, nondistended, + BS MS: no deformity or atrophy  Skin: warm and dry, no rash Neuro:  Strength and sensation are intact Psych: euthymic mood, full affect   EKG:  EKG is ordered today. The ekg ordered today demonstrates sinus bradycardia with no significant ST or T wave changes.   Recent Labs: 04/23/2018: B Natriuretic Peptide 483.0 05/02/2018: Pro B Natriuretic peptide (BNP) 161.0; TSH 2.22 07/18/2018: ALT 32; BUN 31; Creat 0.82; Hemoglobin 12.0; Magnesium 1.9; Platelets 261; Potassium 4.8; Sodium 140    Lipid Panel    Component Value Date/Time   CHOL 200 04/24/2018 1005   TRIG 110 04/24/2018 1005   HDL 53 04/24/2018 1005   CHOLHDL 3.8 04/24/2018 1005   VLDL 22 04/24/2018 1005   LDLCALC 125 (H) 04/24/2018 1005      Wt Readings from Last 3 Encounters:  07/29/18 125 lb (56.7 kg)  07/18/18 126 lb 2 oz (57.2 kg)  07/16/18 126 lb 8 oz (57.4 kg)       No flowsheet data found.    ASSESSMENT AND PLAN:  1.  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  Minimal chest pain at the present time without any  antianginal medications.    2.  Chronic diastolic heart failure: She appears to be euvolemic on small dose furosemide.  3.  Multiple sclerosis stable on medications  4.  Asymptomatic sinus bradycardia continue to monitor.   Disposition:   FU with me in 1 year  Signed,  Kathlyn Sacramento, MD  07/29/2018 3:10 PM    [place]

## 2018-07-29 NOTE — Patient Instructions (Signed)
Medication Instructions:  No changes  If you need a refill on your cardiac medications before your next appointment, please call your pharmacy.   Lab work: None ordered  Testing/Procedures: None ordered  Follow-Up: At [facility], you and your health needs are our priority.  As part of our continuing mission to provide you with exceptional heart care, we have created designated Provider Care Teams.  These Care Teams include your primary Cardiologist (physician) and Advanced Practice Providers (APPs -  Physician Assistants and Nurse Practitioners) who all work together to provide you with the care you need, when you need it. You will need a follow up appointment in 12 months.  Please call our office 2 months in advance to schedule this appointment.  You may see Muhammad Arida, MD or one of the following Advanced Practice Providers on your designated Care Team:   Christopher Berge, NP Ryan Dunn, PA-C . Jacquelyn Visser, PA-C    

## 2018-08-15 ENCOUNTER — Encounter: Payer: Self-pay | Admitting: Internal Medicine

## 2018-08-15 ENCOUNTER — Ambulatory Visit: Payer: PPO | Admitting: Internal Medicine

## 2018-08-15 VITALS — BP 110/70 | HR 50 | Resp 16 | Ht 66.0 in | Wt 127.0 lb

## 2018-08-15 DIAGNOSIS — J309 Allergic rhinitis, unspecified: Secondary | ICD-10-CM | POA: Diagnosis not present

## 2018-08-15 DIAGNOSIS — R918 Other nonspecific abnormal finding of lung field: Secondary | ICD-10-CM | POA: Diagnosis not present

## 2018-08-15 NOTE — Patient Instructions (Signed)
Start Over the counter Allegra   Albuterol as needed and with exertion  CT chest in 1 year

## 2018-08-15 NOTE — Progress Notes (Signed)
Name: Morgan Herrera MRN: [id number] DOB: [birth year]     CONSULTATION DATE: 05/15/2018 REFERRING MD : Carlis Abbott  CHIEF COMPLAINT: sob  STUDIES:    7.24.19 CT chest Independently reviewed by Me B/l emphysema LUL lung nodule  HISTORY OF PRESENT ILLNESS: Follow up CT chest Follow up PFTs  Results reviewed with patient No evidence of obstructive or restrictive lung disease  CT chest shows stable LUL nodule  Findings reviewed with patient  She has chronic allergic rhinitis Does NOT want to use meds  Uses albuterol infrequently   No signs of infection at this time  No resp distress       Review of Systems:  Gen:  Denies  fever, sweats, chills weigh loss  HEENT: Denies blurred vision, double vision, ear pain, eye pain, hearing loss, nose bleeds, sore throat Cardiac:  No dizziness, chest pain or heaviness, chest tightness,edema, No JVD Resp:   No cough, -sputum production, -shortness of breath,-wheezing, -hemoptysis,  Gi: Denies swallowing difficulty, stomach pain, nausea or vomiting, diarrhea, constipation, bowel incontinence Gu:  Denies bladder incontinence, burning urine Ext:   Denies Joint pain, stiffness or swelling Skin: Denies  skin rash, easy bruising or bleeding or hives Endoc:  Denies polyuria, polydipsia , polyphagia or weight change Psych:   Denies depression, insomnia or hallucinations  Other:  All other systems negative   BP 110/70 (BP Location: Left Arm, Cuff Size: Normal)   Pulse (!) 50   Resp 16   Ht 5\' 6"  (1.676 m)   Wt 127 lb (57.6 kg)   SpO2 95%   BMI 20.50 kg/m    Physical Examination:   GENERAL:NAD, no fevers, chills, no weakness no fatigue HEAD: Normocephalic, atraumatic.  EYES: Pupils equal, round, reactive to light. Extraocular muscles intact. No scleral icterus.  MOUTH: Moist mucosal membrane. Dentition intact. No abscess noted.  EAR, NOSE, THROAT: Clear without exudates. No external lesions.  NECK: Supple. No thyromegaly. No  nodules. No JVD.  PULMONARY: CTA B/L no wheezing, rhonchi, crackles CARDIOVASCULAR: S1 and S2. Regular rate and rhythm. No murmurs, rubs, or gallops. No edema. Pedal pulses 2+ bilaterally.  GASTROINTESTINAL: Soft, nontender, nondistended. No masses. Positive bowel sounds. No hepatosplenomegaly.  MUSCULOSKELETAL: No swelling, clubbing, or edema. Range of motion full in all extremities.  NEUROLOGIC: Cranial nerves II through XII are intact. No gross focal neurological deficits. Sensation intact. Reflexes intact.  SKIN: No ulceration, lesions, rashes, or cyanosis. Skin warm and dry. Turgor intact.  PSYCHIATRIC: Mood, affect within normal limits. The patient is awake, alert and oriented x 3. Insight, judgment intact.  ALL OTHER ROS ARE NEGATIVE            ASSESSMENT / PLAN:  67 year old pleasant white female seen today for abnormal CT chest findings in the setting of probable atypical pneumonia with history of cardiac strain in the setting of prior history of smoking left upper lobe nodule on CT scan  After further assessment of CT scan patient had 2 CT scans one in July 2019 1 in October 2019 in the left upper lobe nodule seems to be pleural-based scarring however due to her previous smoking history recommend CT of the chest in 1 year for follow-up  Recommend OTC antihistamines for allergic rhinitis Recommend using albuterol as needed for intermittent reactive airways disease    Patient satisfied with Plan of action and management. All questions answered  Corrin Parker, M.D.  [facility]  Medical Director [place] Director [place]  Department

## 2018-08-23 ENCOUNTER — Other Ambulatory Visit: Payer: Self-pay | Admitting: Primary Care

## 2018-08-23 DIAGNOSIS — F3342 Major depressive disorder, recurrent, in full remission: Secondary | ICD-10-CM

## 2018-08-25 NOTE — Telephone Encounter (Signed)
Noted, refill sent to pharmacy. 

## 2018-08-25 NOTE — Telephone Encounter (Signed)
Last prescribed on 09/11/2017  Last office visit on 07/11/2018

## 2018-09-22 ENCOUNTER — Other Ambulatory Visit: Payer: Self-pay | Admitting: Primary Care

## 2018-09-22 DIAGNOSIS — Z1231 Encounter for screening mammogram for malignant neoplasm of breast: Secondary | ICD-10-CM

## 2018-10-27 ENCOUNTER — Ambulatory Visit
Admission: RE | Admit: 2018-10-27 | Discharge: 2018-10-27 | Disposition: A | Discharge status: Home / Self Care | Payer: PPO | Source: Ambulatory Visit | Attending: Primary Care | Admitting: Primary Care

## 2018-10-27 ENCOUNTER — Telehealth: Payer: Self-pay | Admitting: Neurology

## 2018-10-27 DIAGNOSIS — Z1231 Encounter for screening mammogram for malignant neoplasm of breast: Secondary | ICD-10-CM | POA: Diagnosis not present

## 2018-10-27 NOTE — Telephone Encounter (Signed)
Patient left VM about paperwork that was to be completed for [employer] ins. She wanted to check the status of this. Please call her back at [phone]. Thanks!

## 2018-10-27 NOTE — Telephone Encounter (Signed)
Will check on paperwork, and call Pt tomorrow

## 2018-10-29 ENCOUNTER — Other Ambulatory Visit: Payer: Self-pay

## 2018-10-29 DIAGNOSIS — G4700 Insomnia, unspecified: Secondary | ICD-10-CM

## 2018-10-29 MED ORDER — ZOLPIDEM TARTRATE 10 MG PO TABS: 5.0000 mg | ORAL_TABLET | Freq: Every evening | ORAL | 0 refills | Status: DC | PRN

## 2018-10-29 NOTE — Telephone Encounter (Signed)
Noted.  Refill sent to pharmacy. 

## 2018-10-29 NOTE — Telephone Encounter (Signed)
Name of Medication: zolpidem (AMBIEN) 10 MG tablet  Name of [facility]: [facility]  Last Fill or Written Date and Quantity: 03/21/2018 #90  Last Office Visit and Type: 07/16/2018 Acute Visit

## 2018-11-03 ENCOUNTER — Telehealth: Payer: Self-pay | Admitting: Neurology

## 2018-11-03 NOTE — Telephone Encounter (Signed)
Called and spoke with Pt. We are working on forms.

## 2018-11-03 NOTE — Telephone Encounter (Signed)
Patient wants to talk to someone about the medication paperwork that she brought in last week

## 2018-11-16 ENCOUNTER — Other Ambulatory Visit: Payer: Self-pay | Admitting: Primary Care

## 2018-11-16 DIAGNOSIS — R609 Edema, unspecified: Secondary | ICD-10-CM

## 2019-01-12 ENCOUNTER — Telehealth: Payer: Self-pay | Admitting: Neurology

## 2019-01-12 ENCOUNTER — Other Ambulatory Visit: Payer: Self-pay | Admitting: *Deleted

## 2019-01-12 ENCOUNTER — Encounter: Payer: Self-pay | Admitting: *Deleted

## 2019-01-12 DIAGNOSIS — Z79899 Other long term (current) drug therapy: Secondary | ICD-10-CM

## 2019-01-12 DIAGNOSIS — G35 Multiple sclerosis: Secondary | ICD-10-CM

## 2019-01-12 DIAGNOSIS — M62838 Other muscle spasm: Secondary | ICD-10-CM

## 2019-01-12 NOTE — Telephone Encounter (Signed)
Orders put in.  Attempted to contact patient to let her know but her mailbox is full.  I will send message via My Chart.

## 2019-01-12 NOTE — Telephone Encounter (Signed)
Patient called regarding needing to have her blood work taken at [facility] between now and her appt on 01/19/19. She wants to make sure her orders were in the system. She was also advised that our office will call her to to let her know if her appt will be an E Visit or scheduled out. Thanks

## 2019-01-13 ENCOUNTER — Other Ambulatory Visit: Payer: Self-pay | Admitting: Primary Care

## 2019-01-14 ENCOUNTER — Other Ambulatory Visit (INDEPENDENT_AMBULATORY_CARE_PROVIDER_SITE_OTHER): Payer: PPO

## 2019-01-14 ENCOUNTER — Other Ambulatory Visit: Payer: Self-pay

## 2019-01-14 DIAGNOSIS — G35D Multiple sclerosis, unspecified: Secondary | ICD-10-CM

## 2019-01-14 DIAGNOSIS — M62838 Other muscle spasm: Secondary | ICD-10-CM | POA: Diagnosis not present

## 2019-01-14 DIAGNOSIS — Z79899 Other long term (current) drug therapy: Secondary | ICD-10-CM

## 2019-01-14 DIAGNOSIS — G35 Multiple sclerosis: Secondary | ICD-10-CM

## 2019-01-14 DIAGNOSIS — I472 Ventricular tachycardia: Secondary | ICD-10-CM | POA: Diagnosis not present

## 2019-01-14 LAB — COMPREHENSIVE METABOLIC PANEL
ALT: 21 U/L (ref 0–35)
AST: 24 U/L (ref 0–37)
Albumin: 4.5 g/dL (ref 3.5–5.2)
Alkaline Phosphatase: 52 U/L (ref 39–117)
BUN: 23 mg/dL (ref 6–23)
CO2: 30 mEq/L (ref 19–32)
Calcium: 9.7 mg/dL (ref 8.4–10.5)
Chloride: 105 mEq/L (ref 96–112)
Creatinine, Ser: 0.76 mg/dL (ref 0.40–1.20)
GFR: 75.83 mL/min (ref >60.00)
Glucose, Bld: 97 mg/dL (ref 70–99)
Potassium: 4 mEq/L (ref 3.5–5.1)
Sodium: 142 mEq/L (ref 135–145)
Total Bilirubin: 0.5 mg/dL (ref 0.2–1.2)
Total Protein: 7.1 g/dL (ref 6.0–8.3)

## 2019-01-14 LAB — CBC WITH DIFFERENTIAL/PLATELET
Basophils Absolute: 0.1 10*3/uL (ref 0.0–0.1)
Basophils Relative: 2.6 % (ref 0.0–3.0)
Eosinophils Absolute: 0.2 10*3/uL (ref 0.0–0.7)
Eosinophils Relative: 4.6 % (ref 0.0–5.0)
HCT: 38.3 % (ref 36.0–46.0)
Hemoglobin: 12.9 g/dL (ref 12.0–15.0)
Lymphocytes Relative: 27.4 % (ref 12.0–46.0)
Lymphs Abs: 1.1 10*3/uL (ref 0.7–4.0)
MCHC: 33.7 g/dL (ref 30.0–36.0)
MCV: 86 fl (ref 78.0–100.0)
Monocytes Absolute: 0.6 10*3/uL (ref 0.1–1.0)
Monocytes Relative: 14.3 % — ABNORMAL HIGH (ref 3.0–12.0)
Neutro Abs: 2.1 10*3/uL (ref 1.4–7.7)
Neutrophils Relative %: 51.1 % (ref 43.0–77.0)
Platelets: 233 10*3/uL (ref 150.0–400.0)
RBC: 4.46 Mil/uL (ref 3.87–5.11)
RDW: 14.2 % (ref 11.5–15.5)
WBC: 4.1 10*3/uL (ref 4.0–10.5)

## 2019-01-14 LAB — VITAMIN D 25 HYDROXY (VIT D DEFICIENCY, FRACTURES): VITD: 85.74 ng/mL (ref 30.00–100.00)

## 2019-01-16 NOTE — Progress Notes (Signed)
Virtual Visit via Video Note The purpose of this virtual visit is to provide medical care while limiting exposure to the novel coronavirus.    Consent was obtained for video visit:  Yes.   Answered questions that patient had about telehealth interaction:  Yes.   I discussed the limitations, risks, security and privacy concerns of performing an evaluation and management service by telemedicine. I also discussed with the patient that there may be a patient responsible charge related to this service. The patient expressed understanding and agreed to proceed.  Pt location: Home Physician Location: office Name of referring provider:  Pleas Koch, NP I connected with Levy Pupa Magee at patients initiation/request on 01/19/2019 at  1:30 PM EDT by video enabled telemedicine application and verified that I am speaking with the correct person using two identifiers. Pt MRN:  [id number] Pt DOB:  [birth year] Video Participants:  Levy Pupa Offutt   History of Present Illness:  Morgan Herrera is a 68 year old right-handed woman with multiple sclerosis, polymyalgia rheumatica and migraine who follows up for multiple sclerosis.  UPDATE: Current medications:  Aubagio, B12 1072mcg daily, D3 2000 IU daily.  Vitamin D level from 12/31/17 was 60.31.  01/14/19 LABS:  CBC with WBC 4.1, HGB 12.9, HCT 38.3, PLT 233, ALC 1.1; CMP with Na 142, K 4, Cl 105, CO2 30, glucose 97, BUN 23, Cr 0.76, t bili 0.5, ALP 52, AST 24, ALT 21; vitamin D 85.74.  She continues to have discomfort in the left leg.  It is an aching type of pain.  It is not a cramp or burning or shooting pain.  There is no associated bowel/bladder dysfunction, weakness, numbness or tingling.  There is no back pain.  The pain moves around.  It may be in the calf, the hip, knee or thigh.  At first, it occurred with just prolonged sitting.  Now it happens while walking as well.  The gabapentin helps but she doesn't frequently take it.  She also  reports some shortness of breath.  It occurs when she goes for walks but it also occurs with light exertion such as making the bed.  She has an occasional cough but denies fevers.    HISTORY: She was diagnosed with MS at age 8, when she presented with bilateral optic neuritis, right worse than left. She was treated with IV steroids at that time.. In hindsight, she recalls brief episodes from her past. In her late-20s, she had a 3 week episode of dizziness and ataxia. She had brief episodes of limb paresthesias lasting a couple of days.  Work up included MRIs. MRIs of the brain have shown lesions suspicious for MS. Prior cervical MRI reportedly normal.  She never had a relapse and therefore never required further IV steroids. However, she was diagnosed with polymyalgia rheumatica several years ago, after experiencing severe joint pain and stiffness.   She occasionally has fatigue once in a while, sometimes lasting for 2 weeks. She does not exhibit Lhermitte's sign. She reports burning and aching on the bottom of her feet when she walks for prolonged period.  She works part-time as a Radiation protection practitioner. For the past couple of years, she reports word-finding difficulty. Sometimes it is difficult to concentrate. It has almost affected her work at times. She has a BA in [place]. She underwent neuropsychological testing in July 2017, which revealed anxiety and depression but no cognitive impairment.  Prior disease modifying drugs: Avonex (tired of injections), Tecfidera (nausea, flushing and unable  to take aspirin)  09/06/15:  MRI of brain with and without contrast showed chronic non-enhancing T2/FLAIR hyperintensities in the periventricular white matter, some demonstrating Dawson's fingers, with mild corpus callosum involvement, which appears stable compared to 2012 and similar to imaging from 2005.  10/09/16:  MRI of brain with and without contrast was stable. 12/28/17:  MRI of brain with  and without contrast was personally reviewed andis stable compared to prior imaging from 2016.  Past Medical History: Past Medical History:  Diagnosis Date  . (HFpEF) heart failure with preserved ejection fraction ([place])    a. 03/2018 Echo: EF 55-60%, no rwma. Mod MR. Nl PASP.  Marland Kitchen Chest pain    a. 03/2018 Ex MV: rest only imaging showed ant wall perfusion defect. No stress imaging as pt developed hypotension w/ exercise; b. 05/2018 Cath: Nl cors w/ sluggish flow in teh LAD suggestive of endothelial dysfxn. EF 55-65%.  . Migraine   . Multiple sclerosis ([place])   . Neuromuscular disorder ([place])   . PMR (polymyalgia rheumatica) ([facility])   . Pulmonary nodule    a. 03/2018 CTA Chest: 1mm small spiculated nodular density @ the L lung apex. Rec f/u in 3 mos.    Medications: Outpatient Encounter Medications as of 01/19/2019  Medication Sig  . albuterol (PROAIR HFA) 108 (90 Base) MCG/ACT inhaler Inhale 2 puffs into the lungs every 6 (six) hours as needed for wheezing or shortness of breath.  Marland Kitchen BIOTIN PO Take 1 tablet by mouth daily.  . calcium carbonate (OS-CAL - DOSED IN MG OF ELEMENTAL CALCIUM) 1250 (500 Ca) MG tablet Take 1 tablet by mouth daily.  . cholecalciferol (VITAMIN D) 1000 units tablet Take 2,000 Units by mouth daily.  . cyanocobalamin 500 MCG tablet Take 500 mcg by mouth daily.  Marland Kitchen escitalopram (LEXAPRO) 5 MG tablet TAKE 1 TABLET BY MOUTH EVERY DAY  . furosemide (LASIX) 20 MG tablet TAKE 1 TABLET BY MOUTH EVERY DAY  . gabapentin (NEURONTIN) 100 MG capsule Take 1 capsule (100 mg total) by mouth at bedtime. (Patient taking differently: Take 100 mg by mouth at bedtime as needed. )  . Teriflunomide (AUBAGIO) 14 MG TABS Take 14 mg by mouth daily.  Marland Kitchen zolpidem (AMBIEN) 10 MG tablet Take 0.5-1 tablets (5-10 mg total) by mouth at bedtime as needed for sleep.   No facility-administered encounter medications on file as of 01/19/2019.     Allergies: No Known Allergies  Family History: Family History   Problem Relation Age of Onset  . Heart disease Mother   . Hypertension Mother   . Parkinson's disease Mother   . Parkinsonism Mother   . Cancer Maternal Grandmother        breast  . Breast cancer Maternal Grandmother   . Asthma Father     Social History: Social History   Socioeconomic History  . Marital status: Married    Spouse name: Not on file  . Number of children: Not on file  . Years of education: Not on file  . Highest education level: Not on file  Occupational History  . Not on file  Social Needs  . Financial resource strain: Not on file  . Food insecurity:    Worry: Not on file    Inability: Not on file  . Transportation needs:    Medical: Not on file    Non-medical: Not on file  Tobacco Use  . Smoking status: Former Research scientist (life sciences)  . Smokeless tobacco: Never Used  Substance and Sexual Activity  .  Alcohol use: Yes    Alcohol/week: 0.0 standard drinks    Comment: 1 to 2 glass of wine at least 4 nights a week  . Drug use: No  . Sexual activity: Not on file  Lifestyle  . Physical activity:    Days per week: Not on file    Minutes per session: Not on file  . Stress: Not on file  Relationships  . Social connections:    Talks on phone: Not on file    Gets together: Not on file    Attends religious service: Not on file    Active member of club or organization: Not on file    Attends meetings of clubs or organizations: Not on file    Relationship status: Not on file  . Intimate partner violence:    Fear of current or ex partner: Not on file    Emotionally abused: Not on file    Physically abused: Not on file    Forced sexual activity: Not on file  Other Topics Concern  . Not on file  Social History Narrative   Married.   Moved here from [place] after 35 years   Enjoys arts and crafts. Taking a painting class.   Walking with her spouse.    Reading, shopping.   Observations/Objective:   Pulse (!) 58, height 5\' 6"  (1.676 m), weight 125 lb (56.7 kg). Alert  and oriented.  Remote and recent memory intact.  Attention and concentration intact.  Fund of knowledge intact.  Speech fluent and not dysarthric.  Language intact.  Eyes orthophoric and move in all directions.  Facial sensation intact.  Face symmetric.  Tongue midline.  No pronator drift.  Finger-thumb tapping symmetric.  Romberg with sway.  Assessment and Plan:   Multiple sclerosis Left leg pain.  Not clearly radicular but may be.  Has gotten a little worse.  Responds to gabapentin.  1.  Aubagio for DMT 2.  Increase gabapentin 100mg .  May take up to three times daily. 3.  Take D3 5000 IU daily 4.  Repeat CBC with diff, LFTs and vitamin D in 6 months. 5.  Advised to contact PCP regarding dyspnea on exertion 6.  Follow up in 6 months.  Follow Up Instructions:    -I discussed the assessment and treatment plan with the patient. The patient was provided an opportunity to ask questions and all were answered. The patient agreed with the plan and demonstrated an understanding of the instructions.   The patient was advised to call back or seek an in-person evaluation if the symptoms worsen or if the condition fails to improve as anticipated.  Dudley Major, DO]

## 2019-01-19 ENCOUNTER — Other Ambulatory Visit: Payer: Self-pay

## 2019-01-19 ENCOUNTER — Encounter: Payer: Self-pay | Admitting: Neurology

## 2019-01-19 ENCOUNTER — Telehealth (INDEPENDENT_AMBULATORY_CARE_PROVIDER_SITE_OTHER): Payer: PPO | Admitting: Neurology

## 2019-01-19 VITALS — HR 58 | Ht 66.0 in | Wt 125.0 lb

## 2019-01-19 DIAGNOSIS — G35 Multiple sclerosis: Secondary | ICD-10-CM | POA: Diagnosis not present

## 2019-01-19 DIAGNOSIS — M62838 Other muscle spasm: Secondary | ICD-10-CM

## 2019-01-19 DIAGNOSIS — M79605 Pain in left leg: Secondary | ICD-10-CM

## 2019-01-19 DIAGNOSIS — Z79899 Other long term (current) drug therapy: Secondary | ICD-10-CM

## 2019-01-19 MED ORDER — GABAPENTIN 100 MG PO CAPS: 100.0000 mg | ORAL_CAPSULE | Freq: Three times a day (TID) | ORAL | 5 refills | Status: DC

## 2019-01-20 ENCOUNTER — Other Ambulatory Visit: Payer: Self-pay | Admitting: Primary Care

## 2019-01-20 DIAGNOSIS — G4700 Insomnia, unspecified: Secondary | ICD-10-CM

## 2019-01-20 NOTE — Telephone Encounter (Signed)
Name of Medication:zolpidem (AMBIEN) 10 mg  [facility]  Last Fill or Written Date and Quantity:10/29/2018 #90  Last Office Visit and Type:07/16/2018 with Dr Lorelei Pont Acute Visit

## 2019-01-20 NOTE — Telephone Encounter (Signed)
Noted, refill sent to pharmacy. 

## 2019-02-11 ENCOUNTER — Other Ambulatory Visit: Payer: Self-pay | Admitting: Neurology

## 2019-02-11 DIAGNOSIS — M62838 Other muscle spasm: Secondary | ICD-10-CM

## 2019-02-20 ENCOUNTER — Telehealth: Payer: Self-pay | Admitting: Cardiovascular Disease

## 2019-02-20 NOTE — Telephone Encounter (Signed)
Patient returning call.

## 2019-02-20 NOTE — Telephone Encounter (Signed)
Pt c/o Shortness Of Breath: STAT if SOB developed within the last 24 hours or pt is noticeably SOB on the phone  1. Are you currently SOB (can you hear that pt is SOB on the phone)? no  2. How long have you been experiencing SOB? Several months  3. Are you SOB when sitting or when up moving around? Up moving around  4. Are you currently experiencing any other symptoms? Headache, and can walk from living room to kitchen and have to stop and catch her breath, but not consistent.

## 2019-02-20 NOTE — Telephone Encounter (Signed)
First number listed went straight to a busy signal. Left message to call back on second number.

## 2019-02-24 NOTE — Telephone Encounter (Signed)
Spoke with patient. States she's been having increased SOB over the past few months. For example, when she does yoga she may get out of breath and it takes a few minutes for her to recover. Patient is agreeable to virtual visit.  States her mobile number is considered international ([place] ara code) and it may not work with video.  She is willing to try.  Otherwise, will need to do telephone with home number. MyChart consent sent to to patient and she will review and respond.

## 2019-02-25 ENCOUNTER — Telehealth: Payer: Self-pay

## 2019-02-25 NOTE — Telephone Encounter (Signed)
Left message reminding patient to sign teleheath consent through MyChart prior to appointment with Dr. Fletcher Anon tomorrow. Advised patient to call back if she has any questions or concerns.

## 2019-02-26 ENCOUNTER — Other Ambulatory Visit: Payer: Self-pay

## 2019-02-26 ENCOUNTER — Encounter: Payer: Self-pay | Admitting: Cardiovascular Disease

## 2019-02-26 ENCOUNTER — Telehealth (INDEPENDENT_AMBULATORY_CARE_PROVIDER_SITE_OTHER): Payer: PPO | Admitting: Cardiovascular Disease

## 2019-02-26 VITALS — BP 113/53 | HR 63 | Temp 97.7°F | Ht 66.0 in | Wt 129.0 lb

## 2019-02-26 DIAGNOSIS — G35 Multiple sclerosis: Secondary | ICD-10-CM | POA: Diagnosis not present

## 2019-02-26 DIAGNOSIS — R0609 Other forms of dyspnea: Secondary | ICD-10-CM

## 2019-02-26 DIAGNOSIS — I5032 Chronic diastolic (congestive) heart failure: Secondary | ICD-10-CM | POA: Diagnosis not present

## 2019-02-26 NOTE — Addendum Note (Signed)
Addended by: Lamar Laundry on: 02/26/2019 08:38 AM   Modules accepted: Orders

## 2019-02-26 NOTE — Progress Notes (Signed)
Virtual Visit via Video Note   This visit type was conducted due to national recommendations for restrictions regarding the COVID-19 Pandemic (e.g. social distancing) in an effort to limit this patient's exposure and mitigate transmission in our community.  Due to her co-morbid illnesses, this patient is at least at moderate risk for complications without adequate follow up.  This format is felt to be most appropriate for this patient at this time.  All issues noted in this document were discussed and addressed.  A limited physical exam was performed with this format.  Please refer to the patient's chart for her consent to telehealth for [facility].   Date:  02/26/2019   ID:  Morgan Herrera, DOB 1950-10-27, MRN [id number]  Patient Location: Home Provider Location: Office  PCP:  Pleas Koch, NP  Cardiologist:  Kathlyn Sacramento, MD  Electrophysiologist:  None   Evaluation Performed:  Follow-Up Visit  Chief Complaint: Shortness of breath  History of Present Illness:    Morgan Herrera is a 68 y.o. female who was seen via video visit for follow-up regarding shortness of breath.  She has been followed for previous chest pain thought to be due to endothelial dysfunction. She has known history of multiple sclerosis, optic neuritis, polymyalgia rheumatica, remote tobacco use and migraines. The patient was hospitalized in July of 2019 with fatigue, shortness of breath and chest pain.  D-dimer was elevated.  CTA showed no evidence of pulmonary embolism.  BNP was mildly elevated.  Echocardiogram was unremarkable.  Stress test was aborted due to hypertensive response with exercise.  Cardiac catheterization was performed which showed normal coronary arteries with sluggish flow in the LAD suggestive of endothelial dysfunction.  EF was normal.  LVEDP was mildly elevated.  Over the last 6 months, she has experienced noticeable exertional dyspnea especially with any incline walking.  She is  very active in terms of exercise and usually walks 3 miles daily with yoga few times a week.  She feels fine if she is walking on a flat level but then she starts" huffing and puffing" if there is any incline or walking upstairs.  No orthopnea, PND or leg edema.  She has minimal chest discomfort.  The patient does not have symptoms concerning for COVID-19 infection (fever, chills, cough, or new shortness of breath).    Past Medical History:  Diagnosis Date  . (HFpEF) heart failure with preserved ejection fraction ([place])    a. 03/2018 Echo: EF 55-60%, no rwma. Mod MR. Nl PASP.  Marland Kitchen Chest pain    a. 03/2018 Ex MV: rest only imaging showed ant wall perfusion defect. No stress imaging as pt developed hypotension w/ exercise; b. 05/2018 Cath: Nl cors w/ sluggish flow in teh LAD suggestive of endothelial dysfxn. EF 55-65%.  . Migraine   . Multiple sclerosis ([place])   . Neuromuscular disorder ([place])   . PMR (polymyalgia rheumatica) (HCC)   . Pulmonary nodule    a. 03/2018 CTA Chest: 31mm small spiculated nodular density @ the L lung apex. Rec f/u in 3 mos.   Past Surgical History:  Procedure Laterality Date  . BREAST EXCISIONAL BIOPSY    . BREAST SURGERY  1990  . COLONOSCOPY WITH PROPOFOL N/A 05/19/2015   Procedure: COLONOSCOPY WITH PROPOFOL;  Surgeon: Hulen Luster, MD;  Location: [facility] ENDOSCOPY;  Service: Gastroenterology;  Laterality: N/A;  . EYE SURGERY     1978 1st of 3 eye surgeries  . LEFT HEART CATH AND CORONARY ANGIOGRAPHY N/A  04/25/2018   Procedure: LEFT HEART CATH AND CORONARY ANGIOGRAPHY;  Surgeon: Wellington Hampshire, MD;  Location: [place] CV LAB;  Service: Cardiovascular;  Laterality: N/A;  . TENDON REPAIR       Current Meds  Medication Sig  . albuterol (PROAIR HFA) 108 (90 Base) MCG/ACT inhaler Inhale 2 puffs into the lungs every 6 (six) hours as needed for wheezing or shortness of breath.  . calcium carbonate (OS-CAL - DOSED IN MG OF ELEMENTAL CALCIUM) 1250 (500 Ca) MG tablet Take 1  tablet by mouth daily.  . cholecalciferol (VITAMIN D) 1000 units tablet Take 2,000 Units by mouth daily.  . cyanocobalamin 500 MCG tablet Take 500 mcg by mouth daily.  Marland Kitchen escitalopram (LEXAPRO) 5 MG tablet TAKE 1 TABLET BY MOUTH EVERY DAY  . furosemide (LASIX) 20 MG tablet TAKE 1 TABLET BY MOUTH EVERY DAY  . gabapentin (NEURONTIN) 100 MG capsule TAKE 1 CAPSULE BY MOUTH THREE TIMES A DAY  . Teriflunomide (AUBAGIO) 14 MG TABS Take 14 mg by mouth daily.  Marland Kitchen zolpidem (AMBIEN) 10 MG tablet TAKE 0.5-1 TABLETS (5-10 MG TOTAL) BY MOUTH AT BEDTIME AS NEEDED FOR SLEEP.     Allergies:   Patient has no known allergies.   Social History   Tobacco Use  . Smoking status: Former Research scientist (life sciences)  . Smokeless tobacco: Never Used  Substance Use Topics  . Alcohol use: Yes    Alcohol/week: 0.0 standard drinks    Comment: 1 to 2 glass of wine at least 4 nights a week  . Drug use: No     Family Hx: The patient's family history includes Asthma in her father; Breast cancer in her maternal grandmother; Cancer in her maternal grandmother; Heart disease in her mother; Hypertension in her mother; Parkinson's disease in her mother; Parkinsonism in her mother.  ROS:   Please see the history of present illness.     All other systems reviewed and are negative.   Prior CV studies:   The following studies were reviewed today:  Reviewed cardiac studies from last year.  Labs/Other Tests and Data Reviewed:    EKG:  No ECG reviewed.  Recent Labs: 04/23/2018: B Natriuretic Peptide 483.0 05/02/2018: Pro B Natriuretic peptide (BNP) 161.0; TSH 2.22 07/18/2018: Magnesium 1.9 01/14/2019: ALT 21; BUN 23; Creatinine, Ser 0.76; Hemoglobin 12.9; Platelets 233.0; Potassium 4.0; Sodium 142   Recent Lipid Panel Lab Results  Component Value Date/Time   CHOL 200 04/24/2018 10:05 AM   TRIG 110 04/24/2018 10:05 AM   HDL 53 04/24/2018 10:05 AM   CHOLHDL 3.8 04/24/2018 10:05 AM   LDLCALC 125 (H) 04/24/2018 10:05 AM    Wt Readings  from Last 3 Encounters:  02/26/19 129 lb (58.5 kg)  01/19/19 125 lb (56.7 kg)  08/15/18 127 lb (57.6 kg)     Objective:    Vital Signs:  BP (!) 113/53   Pulse 63   Temp 97.7 F (36.5 C)   Ht 5\' 6"  (1.676 m)   Wt 129 lb (58.5 kg)   BMI 20.82 kg/m    VITAL SIGNS:  reviewed GEN:  no acute distress EYES:  sclerae anicteric, EOMI - Extraocular Movements Intact RESPIRATORY:  normal respiratory effort, symmetric expansion CARDIOVASCULAR:  no peripheral edema SKIN:  no rash, lesions or ulcers. MUSCULOSKELETAL:  no obvious deformities. NEURO:  alert and oriented x 3, no obvious focal deficit PSYCH:  normal affect  ASSESSMENT & PLAN:    1.    Worsening exertional dyspnea: Unclear etiology.  I reviewed her labs done in April which were unremarkable.  I am going to obtain an echocardiogram to evaluate LV systolic function, diastolic function and pulmonary pressure. If echocardiogram is unremarkable, pulmonary evaluation might be needed.  She has been seen in the past by Dr. Mortimer Fries.  She is a former smoker.  2. 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  Minimal chest pain at the present time without any antianginal medications.    I doubt that this is causing shortness of breath.  3.  Chronic diastolic heart failure: She appears to be euvolemic on small dose furosemide.  4.  Multiple sclerosis stable on medications  5.  Asymptomatic sinus bradycardia continue to monitor.   COVID-19 Education: The signs and symptoms of COVID-19 were discussed with the patient and how to seek care for testing (follow up with PCP or arrange E-visit).  The importance of social distancing was discussed today.  Time:   Today, I have spent 12 minutes with the patient with telehealth technology discussing the above problems.     Medication Adjustments/Labs and Tests Ordered: Current medicines are reviewed at length with the patient today.  Concerns regarding medicines are outlined above.    Tests Ordered: No orders of the defined types were placed in this encounter.   Medication Changes: No orders of the defined types were placed in this encounter.   Disposition:  Follow up in 6 month(s)  Signed, Kathlyn Sacramento, MD  02/26/2019 8:15 AM    [place]

## 2019-02-26 NOTE — Patient Instructions (Signed)
Medication Instructions:  No change in medications If you need a refill on your cardiac medications before your next appointment, please call your pharmacy.   Lab work: None If you have labs (blood work) drawn today and your tests are completely normal, you will receive your results only by: Marland Kitchen MyChart Message (if you have MyChart) OR . A paper copy in the mail If you have any lab test that is abnormal or we need to change your treatment, we will call you to review the results.  Testing/Procedures: Echocardiogram  Follow-Up: At [facility], you and your health needs are our priority.  As part of our continuing mission to provide you with exceptional heart care, we have created designated Provider Care Teams.  These Care Teams include your primary Cardiologist (physician) and Advanced Practice Providers (APPs -  Physician Assistants and Nurse Practitioners) who all work together to provide you with the care you need, when you need it. You will need a follow up appointment in 6 months.  Please call our office 2 months in advance to schedule this appointment.  You may see Kathlyn Sacramento, MD or one of the following Advanced Practice Providers on your designated Care Team:   Murray Hodgkins, NP Christell Faith, PA-C . Marrianne Mood, PA-C

## 2019-03-18 ENCOUNTER — Other Ambulatory Visit: Payer: Self-pay

## 2019-03-18 ENCOUNTER — Ambulatory Visit (INDEPENDENT_AMBULATORY_CARE_PROVIDER_SITE_OTHER): Payer: PPO

## 2019-03-18 DIAGNOSIS — R0609 Other forms of dyspnea: Secondary | ICD-10-CM | POA: Diagnosis not present

## 2019-03-20 ENCOUNTER — Telehealth: Payer: Self-pay | Admitting: Cardiovascular Disease

## 2019-03-20 NOTE — Telephone Encounter (Signed)
Patient calling to discuss recent  Echo testing results   Please call   

## 2019-03-23 DIAGNOSIS — H16042 Marginal corneal ulcer, left eye: Secondary | ICD-10-CM | POA: Diagnosis not present

## 2019-03-23 NOTE — Telephone Encounter (Signed)
Notes recorded by Wellington Hampshire, MD on 03/20/2019 at 10:05 AM EDT  Inform patient that echo was fine. Normal ejection fraction with mild to moderate mitral regurgitation. Diastolic function was reported to be abnormal but I looked at the study and I think her diastolic function is normal. Based on this, no explanation for shortness of breath from a cardiac standpoint. Consider pulmonary evaluation.   Cc: Alma Friendly

## 2019-03-23 NOTE — Telephone Encounter (Signed)
I spoke with the patient regarding her echo results and she voices understanding.

## 2019-05-07 ENCOUNTER — Other Ambulatory Visit: Payer: Self-pay

## 2019-05-07 ENCOUNTER — Ambulatory Visit (HOSPITAL_COMMUNITY)
Admission: EM | Admit: 2019-05-07 | Discharge: 2019-05-07 | Disposition: A | Discharge status: Home / Self Care | Payer: PPO | Attending: Physician Assistant | Admitting: Physician Assistant

## 2019-05-07 ENCOUNTER — Ambulatory Visit (INDEPENDENT_AMBULATORY_CARE_PROVIDER_SITE_OTHER): Payer: PPO

## 2019-05-07 ENCOUNTER — Telehealth: Payer: Self-pay

## 2019-05-07 DIAGNOSIS — E559 Vitamin D deficiency, unspecified: Secondary | ICD-10-CM | POA: Diagnosis not present

## 2019-05-07 DIAGNOSIS — I5032 Chronic diastolic (congestive) heart failure: Secondary | ICD-10-CM | POA: Diagnosis not present

## 2019-05-07 DIAGNOSIS — Z20828 Contact with and (suspected) exposure to other viral communicable diseases: Secondary | ICD-10-CM | POA: Diagnosis not present

## 2019-05-07 DIAGNOSIS — R0602 Shortness of breath: Secondary | ICD-10-CM

## 2019-05-07 DIAGNOSIS — F329 Major depressive disorder, single episode, unspecified: Secondary | ICD-10-CM | POA: Insufficient documentation

## 2019-05-07 DIAGNOSIS — Z79899 Other long term (current) drug therapy: Secondary | ICD-10-CM | POA: Insufficient documentation

## 2019-05-07 DIAGNOSIS — Z7952 Long term (current) use of systemic steroids: Secondary | ICD-10-CM | POA: Diagnosis not present

## 2019-05-07 DIAGNOSIS — Z87891 Personal history of nicotine dependence: Secondary | ICD-10-CM | POA: Insufficient documentation

## 2019-05-07 DIAGNOSIS — M353 Polymyalgia rheumatica: Secondary | ICD-10-CM | POA: Insufficient documentation

## 2019-05-07 DIAGNOSIS — Z8249 Family history of ischemic heart disease and other diseases of the circulatory system: Secondary | ICD-10-CM | POA: Diagnosis not present

## 2019-05-07 DIAGNOSIS — Z803 Family history of malignant neoplasm of breast: Secondary | ICD-10-CM | POA: Insufficient documentation

## 2019-05-07 DIAGNOSIS — R05 Cough: Secondary | ICD-10-CM | POA: Diagnosis not present

## 2019-05-07 DIAGNOSIS — G4700 Insomnia, unspecified: Secondary | ICD-10-CM | POA: Diagnosis not present

## 2019-05-07 DIAGNOSIS — Z825 Family history of asthma and other chronic lower respiratory diseases: Secondary | ICD-10-CM | POA: Insufficient documentation

## 2019-05-07 DIAGNOSIS — G35 Multiple sclerosis: Secondary | ICD-10-CM | POA: Insufficient documentation

## 2019-05-07 MED ORDER — PREDNISONE 50 MG PO TABS: ORAL_TABLET | ORAL | 0 refills | Status: DC

## 2019-05-07 MED ORDER — ALBUTEROL SULFATE HFA 108 (90 BASE) MCG/ACT IN AERS: 1.0000 | INHALATION_SPRAY | Freq: Four times a day (QID) | RESPIRATORY_TRACT | 1 refills | Status: DC | PRN

## 2019-05-07 NOTE — Telephone Encounter (Signed)
Noted, agree with advice given 

## 2019-05-07 NOTE — ED Triage Notes (Signed)
Triaged by provider  

## 2019-05-07 NOTE — Telephone Encounter (Signed)
Pt walked in; pt having SOB upon exertion; prod cough with clear - yellow phlegm. Pt has hx of pneumonia. Pt has H/A 1 1/2 wks mostly continuous in rt temple area. Stiffness in neck and on and off neck pain in back of neck that radiates down both shoulders and arms. Legs feel weak. Pt states she is not in any distress.Pt was advised to go to UC and pt said she will go to [facility] ED now.Gentry Fitz NP out of office until 05/12/19. Will Dion Saucier NP and Avie Echevaria NP who is in office.

## 2019-05-07 NOTE — Discharge Instructions (Addendum)
Your chest xray is normal today.  Use albuterol inhaler.   Follow up with your Physician.  Your covid test is pending.

## 2019-05-07 NOTE — ED Provider Notes (Signed)
Morgan Herrera    CSN: [id number] Arrival date & time: 05/07/19  1220      History   Chief Complaint No chief complaint on file.   HPI Morgan Herrera is a 68 y.o. female.   The history is provided by the patient. No language interpreter was used.  Cough Cough characteristics:  Non-productive Sputum characteristics:  Nondescript Severity:  Moderate Onset quality:  Gradual Timing:  Constant Progression:  Worsening Chronicity:  New Smoker: no   Worsened by:  Nothing Ineffective treatments:  None tried Associated symptoms: no shortness of breath   Pt complains of a cough for several months. Pt reports she had pneumonia a year ago.  Pt reports she is concerned that she has again.  Pt denies any fever.   Past Medical History:  Diagnosis Date  . (HFpEF) heart failure with preserved ejection fraction ([place])    a. 03/2018 Echo: EF 55-60%, no rwma. Mod MR. Nl PASP.  Marland Kitchen Chest pain    a. 03/2018 Ex MV: rest only imaging showed ant wall perfusion defect. No stress imaging as pt developed hypotension w/ exercise; b. 05/2018 Cath: Nl cors w/ sluggish flow in teh LAD suggestive of endothelial dysfxn. EF 55-65%.  . Migraine   . Multiple sclerosis ([place])   . Neuromuscular disorder ([place])   . PMR (polymyalgia rheumatica) (HCC)   . Pulmonary nodule    a. 03/2018 CTA Chest: 38mm small spiculated nodular density @ the L lung apex. Rec f/u in 3 mos.    Patient Active Problem List   Diagnosis Date Noted  . Chronic diastolic heart failure ([place]) 05/07/2018  . Unstable angina ([place])   . PMR (polymyalgia rheumatica) (HCC) 04/23/2018  . Acute CHF (congestive heart failure) ([place]) 04/23/2018  . Welcome to Medicare preventive visit 04/10/2017  . Constipation 03/19/2016  . Relapsing remitting multiple sclerosis ([place]) 12/13/2015  . Insomnia 09/19/2015  . Preventative health care 03/04/2015  . Vitamin D deficiency 03/04/2015  . Multiple sclerosis ([place]) 02/11/2015  . Depression 02/11/2015    Past Surgical History:  Procedure Laterality Date  . BREAST EXCISIONAL BIOPSY    . BREAST SURGERY  1990  . COLONOSCOPY WITH PROPOFOL N/A 05/19/2015   Procedure: COLONOSCOPY WITH PROPOFOL;  Surgeon: Hulen Luster, MD;  Location: [facility] ENDOSCOPY;  Service: Gastroenterology;  Laterality: N/A;  . EYE SURGERY     1978 1st of 3 eye surgeries  . LEFT HEART CATH AND CORONARY ANGIOGRAPHY N/A 04/25/2018   Procedure: LEFT HEART CATH AND CORONARY ANGIOGRAPHY;  Surgeon: Wellington Hampshire, MD;  Location: [place] CV LAB;  Service: Cardiovascular;  Laterality: N/A;  . TENDON REPAIR      OB History   No obstetric history on file.      Home Medications    Prior to Admission medications   Medication Sig Start Date End Date Taking? Authorizing Provider  albuterol (VENTOLIN HFA) 108 (90 Base) MCG/ACT inhaler Inhale 1-2 puffs into the lungs every 6 (six) hours as needed for wheezing or shortness of breath. 05/07/19   Fransico Meadow, PA-C  calcium carbonate (OS-CAL - DOSED IN MG OF ELEMENTAL CALCIUM) 1250 (500 Ca) MG tablet Take 1 tablet by mouth daily.    [provider]  cholecalciferol (VITAMIN D) 1000 units tablet Take 2,000 Units by mouth daily.    [provider]  cyanocobalamin 500 MCG tablet Take 500 mcg by mouth daily.    [provider]  escitalopram (LEXAPRO) 5 MG tablet TAKE 1 TABLET  BY MOUTH EVERY DAY 08/25/18   Pleas Koch, NP  furosemide (LASIX) 20 MG tablet TAKE 1 TABLET BY MOUTH EVERY DAY 11/17/18   Pleas Koch, NP  gabapentin (NEURONTIN) 100 MG capsule TAKE 1 CAPSULE BY MOUTH THREE TIMES A DAY 02/11/19   Tomi Likens, Adam R, DO  predniSONE (DELTASONE) 50 MG tablet One a day 05/07/19   Fransico Meadow, PA-C  Teriflunomide (AUBAGIO) 14 MG TABS Take 14 mg by mouth daily.    [provider]  zolpidem (AMBIEN) 10 MG tablet TAKE 0.5-1 TABLETS (5-10 MG TOTAL) BY MOUTH AT BEDTIME AS NEEDED FOR SLEEP. 01/20/19   Pleas Koch, NP    Family History  Family History  Problem Relation Age of Onset  . Heart disease Mother   . Hypertension Mother   . Parkinson's disease Mother   . Parkinsonism Mother   . Cancer Maternal Grandmother        breast  . Breast cancer Maternal Grandmother   . Asthma Father     Social History Social History   Tobacco Use  . Smoking status: Former Research scientist (life sciences)  . Smokeless tobacco: Never Used  Substance Use Topics  . Alcohol use: Yes    Alcohol/week: 0.0 standard drinks    Comment: 1 to 2 glass of wine at least 4 nights a week  . Drug use: No     Allergies   Patient has no known allergies.   Review of Systems Review of Systems  Respiratory: Positive for cough. Negative for shortness of breath.   All other systems reviewed and are negative.    Physical Exam Triage Vital Signs ED Triage Vitals  Enc Vitals Group     BP 05/07/19 1428 115/87     Pulse Rate 05/07/19 1428 (!) 55     Resp 05/07/19 1428 18     Temp 05/07/19 1428 98.3 F (36.8 C)     Temp src --      SpO2 05/07/19 1428 100 %     Weight --      Height --      Head Circumference --      Peak Flow --      Pain Score 05/07/19 1519 0     Pain Loc --      Pain Edu? --      Excl. in [place]? --    No data found.  Updated Vital Signs BP 115/87   Pulse (!) 55   Temp 98.3 F (36.8 C)   Resp 18   SpO2 100%   Visual Acuity Right Eye Distance:   Left Eye Distance:   Bilateral Distance:    Right Eye Near:   Left Eye Near:    Bilateral Near:     Physical Exam Vitals signs and nursing note reviewed.  Constitutional:      Appearance: She is well-developed.  HENT:     Head: Normocephalic.     Right Ear: External ear normal.     Left Ear: External ear normal.     Nose: Nose normal.     Mouth/Throat:     Mouth: Mucous membranes are moist.  Eyes:     Pupils: Pupils are equal, round, and reactive to light.  Neck:     Musculoskeletal: Normal range of motion.  Cardiovascular:     Rate and Rhythm: Normal rate and regular  rhythm.     Pulses: Normal pulses.  Pulmonary:     Effort: Pulmonary effort is normal.  Abdominal:     General: Abdomen is flat. There is no distension.  Musculoskeletal: Normal range of motion.  Skin:    General: Skin is warm.  Neurological:     General: No focal deficit present.     Mental Status: She is alert and oriented to person, place, and time.  Psychiatric:        Mood and Affect: Mood normal.      UC Treatments / Results  Labs (all labs ordered are listed, but only abnormal results are displayed) Labs Reviewed  NOVEL CORONAVIRUS, NAA (HOSPITAL ORDER, SEND-OUT TO REF LAB)    EKG   Radiology Dg Chest 2 View  Result Date: 05/07/2019 CLINICAL DATA:  Shortness of breath EXAM: CHEST - 2 VIEW COMPARISON:  April 23, 2018 FINDINGS: No edema or consolidation. Heart size and pulmonary vascularity are normal. No adenopathy. There is upper thoracic levoscoliosis. IMPRESSION: No edema or consolidation. Electronically Signed   By: Lowella Grip III M.D.   On: 05/07/2019 14:35    Procedures Procedures (including critical care time)  Medications Ordered in UC Medications - No data to display  Initial Impression / Assessment and Plan / UC Course  I have reviewed the triage vital signs and the nursing notes.  Pertinent labs & imaging results that were available during my care of the patient were reviewed by me and considered in my medical decision making (see chart for details).     MDM  Covid test ordered.   Chest xray  No acute abnormality.  I will try pt on prednisone and have her use her inhaler Final Clinical Impressions(s) / UC Diagnoses   Final diagnoses:  Shortness of breath     Discharge Instructions     Your chest xray is normal today.  Use albuterol inhaler.   Follow up with your Physician.  Your covid test is pending.    ED Prescriptions    Medication Sig Dispense Auth. Provider   albuterol (VENTOLIN HFA) 108 (90 Base) MCG/ACT inhaler Inhale 1-2  puffs into the lungs every 6 (six) hours as needed for wheezing or shortness of breath. 1 g Torria Fromer K, PA-C   predniSONE (DELTASONE) 50 MG tablet One a day 5 tablet Fransico Meadow, [place]     Controlled Substance Prescriptions [place] Controlled Substance Registry consulted? Not Applicable   Fransico Meadow, [place] 05/07/19 [id number]

## 2019-05-08 LAB — NOVEL CORONAVIRUS, NAA (HOSP ORDER, SEND-OUT TO REF LAB; TAT 18-24 HRS): SARS-CoV-2, NAA: NOT DETECTED

## 2019-05-11 ENCOUNTER — Encounter (HOSPITAL_COMMUNITY): Payer: Self-pay

## 2019-05-17 ENCOUNTER — Other Ambulatory Visit: Payer: Self-pay | Admitting: Primary Care

## 2019-05-17 DIAGNOSIS — R609 Edema, unspecified: Secondary | ICD-10-CM

## 2019-05-19 NOTE — Telephone Encounter (Signed)
Last prescribed on 11/17/2018 . Last appointment on 07/16/2018. No future appointment

## 2019-05-19 NOTE — Telephone Encounter (Signed)
Dr. Fletcher Anon, Given this patient's grossly normal echocardiogram is there an indication to continue with daily furosemide? I just wanted to get your thoughts before we refilled something that may not be necessary.  Thanks! Allie Bossier, NP-C

## 2019-05-20 NOTE — Telephone Encounter (Signed)
Sounds great, thanks! Refill sent to pharmacy.

## 2019-05-20 NOTE — Telephone Encounter (Signed)
Probably worth keeping her on small dose furosemide for now given previous mild diastolic heart failure.  I can reevaluate the need for continuation when she follows up with me in few months.  Thanks

## 2019-05-27 DIAGNOSIS — H5201 Hypermetropia, right eye: Secondary | ICD-10-CM | POA: Diagnosis not present

## 2019-05-27 DIAGNOSIS — Z961 Presence of intraocular lens: Secondary | ICD-10-CM | POA: Diagnosis not present

## 2019-05-27 DIAGNOSIS — H52221 Regular astigmatism, right eye: Secondary | ICD-10-CM | POA: Diagnosis not present

## 2019-05-27 DIAGNOSIS — H3540 Unspecified peripheral retinal degeneration: Secondary | ICD-10-CM | POA: Diagnosis not present

## 2019-05-27 DIAGNOSIS — H5212 Myopia, left eye: Secondary | ICD-10-CM | POA: Diagnosis not present

## 2019-05-27 DIAGNOSIS — H2512 Age-related nuclear cataract, left eye: Secondary | ICD-10-CM | POA: Diagnosis not present

## 2019-05-27 DIAGNOSIS — H59811 Chorioretinal scars after surgery for detachment, right eye: Secondary | ICD-10-CM | POA: Diagnosis not present

## 2019-05-27 DIAGNOSIS — H524 Presbyopia: Secondary | ICD-10-CM | POA: Diagnosis not present

## 2019-06-11 ENCOUNTER — Other Ambulatory Visit: Payer: Self-pay

## 2019-06-11 ENCOUNTER — Ambulatory Visit (INDEPENDENT_AMBULATORY_CARE_PROVIDER_SITE_OTHER): Payer: PPO

## 2019-06-11 DIAGNOSIS — Z23 Encounter for immunization: Secondary | ICD-10-CM

## 2019-07-17 ENCOUNTER — Other Ambulatory Visit: Payer: Self-pay

## 2019-07-17 ENCOUNTER — Other Ambulatory Visit (INDEPENDENT_AMBULATORY_CARE_PROVIDER_SITE_OTHER): Payer: PPO

## 2019-07-17 DIAGNOSIS — Z79899 Other long term (current) drug therapy: Secondary | ICD-10-CM | POA: Diagnosis not present

## 2019-07-17 DIAGNOSIS — G35 Multiple sclerosis: Secondary | ICD-10-CM

## 2019-07-17 LAB — CBC WITH DIFFERENTIAL/PLATELET
Basophils Absolute: 0.1 10*3/uL (ref 0.0–0.1)
Basophils Relative: 3 % (ref 0.0–3.0)
Eosinophils Absolute: 0.2 10*3/uL (ref 0.0–0.7)
Eosinophils Relative: 3.2 % (ref 0.0–5.0)
HCT: 39.8 % (ref 36.0–46.0)
Hemoglobin: 13 g/dL (ref 12.0–15.0)
Lymphocytes Relative: 21.7 % (ref 12.0–46.0)
Lymphs Abs: 1.1 10*3/uL (ref 0.7–4.0)
MCHC: 32.5 g/dL (ref 30.0–36.0)
MCV: 87 fl (ref 78.0–100.0)
Monocytes Absolute: 0.6 10*3/uL (ref 0.1–1.0)
Monocytes Relative: 12.4 % — ABNORMAL HIGH (ref 3.0–12.0)
Neutro Abs: 2.9 10*3/uL (ref 1.4–7.7)
Neutrophils Relative %: 59.7 % (ref 43.0–77.0)
Platelets: 238 10*3/uL (ref 150.0–400.0)
RBC: 4.58 Mil/uL (ref 3.87–5.11)
RDW: 14.3 % (ref 11.5–15.5)
WBC: 4.9 10*3/uL (ref 4.0–10.5)

## 2019-07-17 LAB — COMPREHENSIVE METABOLIC PANEL
ALT: 31 U/L (ref 0–35)
AST: 30 U/L (ref 0–37)
Albumin: 4.5 g/dL (ref 3.5–5.2)
Alkaline Phosphatase: 56 U/L (ref 39–117)
BUN: 25 mg/dL — ABNORMAL HIGH (ref 6–23)
CO2: 30 mEq/L (ref 19–32)
Calcium: 9.8 mg/dL (ref 8.4–10.5)
Chloride: 104 mEq/L (ref 96–112)
Creatinine, Ser: 0.81 mg/dL (ref 0.40–1.20)
GFR: 70.35 mL/min (ref >60.00)
Glucose, Bld: 116 mg/dL — ABNORMAL HIGH (ref 70–99)
Potassium: 4.1 mEq/L (ref 3.5–5.1)
Sodium: 141 mEq/L (ref 135–145)
Total Bilirubin: 0.4 mg/dL (ref 0.2–1.2)
Total Protein: 6.9 g/dL (ref 6.0–8.3)

## 2019-07-17 LAB — VITAMIN D 25 HYDROXY (VIT D DEFICIENCY, FRACTURES): VITD: 62.32 ng/mL (ref 30.00–100.00)

## 2019-07-21 NOTE — Progress Notes (Signed)
NEUROLOGY FOLLOW UP OFFICE NOTE  Morgan Herrera [id number]  HISTORY OF PRESENT ILLNESS: Morgan Herrera is a 68 year old right-handed woman with multiple sclerosis, polymyalgia rheumatica and migraine who follows up for multiple sclerosis.  UPDATE: Current medications: Aubagio, B12 1072mcg daily, D3 2000 IU daily. Vitamin D level from 12/31/17 was 60.31.  07/17/2019 LABS: CBC with WBC 4.9, Hgb 13, HCT 39.8, PLT 238, ALC 1.1; CMP with NA 141, K4.1, CL 104, CO2 30, glucose 116, BUN 25, CR 0.81, T bili 0.4, ALP 56, AST 30, ALT 31; vitamin D 62.32  She reports difficulty with regulating body temperature.  She fluctuates from feeling hot to cold.   Vision: Chronic right sided peripheral vision loss Motor:  No concerns Sensory:  none Pain:  Left leg aches.  No back pain. Gait:  none Bowel/Bladder:  No concerns Fatigue:  She feels more tired than usual but it is not affecting her quality of life.  She sleeps well. Cognition:  No concerns Mood:  No concerns She still has a chronic cough and slight shortness of breath with over-exertion.    She participates in yoga and walking   HISTORY: She was diagnosed with MS at age 75, when she presented with bilateral optic neuritis, right worse than left. She was treated with IV steroids at that time.. In hindsight, she recalls brief episodes from her past. In her late-20s, she had a 3 week episode of dizziness and ataxia. She had brief episodes of limb paresthesias lasting a couple of days.  Work up included MRIs. MRIs of the brain have shown lesions suspicious for MS. Prior cervical MRI reportedly normal.  She never had a relapse and therefore never required further IV steroids. However, she was diagnosed with polymyalgia rheumatica several years ago, after experiencing severe joint pain and stiffness.   She occasionally has fatigue once in a while, sometimes lasting for 2 weeks. She does not exhibit Lhermitte's sign. She  reports burning and aching on the bottom of her feet when she walks for prolonged period.  She works part-time as a Radiation protection practitioner. For the past couple of years, she reports word-finding difficulty. Sometimes it is difficult to concentrate. It has almost affected her work at times. She has a BA in [place]. She underwent neuropsychological testing in July 2017, which revealed anxiety and depression but no cognitive impairment.  Prior disease modifying drugs: Avonex (tired of injections), Tecfidera (nausea, flushing and unable to take aspirin)  09/06/15:MRI of brain with and without contrastshowedchronic non-enhancing T2/FLAIR hyperintensities in the periventricular white matter, some demonstrating Dawson's fingers, with mild corpus callosum involvement, which appears stable compared to 2012 and similar to imaging from 2005. 10/09/16:MRI of brain with and without contrast was stable. 12/28/17:MRI of brain with and without contrast was personally reviewed andis stable compared to prior imaging from 2016.  PAST MEDICAL HISTORY: Past Medical History:  Diagnosis Date  . (HFpEF) heart failure with preserved ejection fraction ([place])    a. 03/2018 Echo: EF 55-60%, no rwma. Mod MR. Nl PASP.  Marland Kitchen Chest pain    a. 03/2018 Ex MV: rest only imaging showed ant wall perfusion defect. No stress imaging as pt developed hypotension w/ exercise; b. 05/2018 Cath: Nl cors w/ sluggish flow in teh LAD suggestive of endothelial dysfxn. EF 55-65%.  . Migraine   . Multiple sclerosis ([place])   . Neuromuscular disorder ([place])   . PMR (polymyalgia rheumatica) ([facility])   . Pulmonary nodule    a. 03/2018 CTA Chest: 60mm  small spiculated nodular density @ the L lung apex. Rec f/u in 3 mos.    MEDICATIONS: Current Outpatient Medications on File Prior to Visit  Medication Sig Dispense Refill  . albuterol (VENTOLIN HFA) 108 (90 Base) MCG/ACT inhaler Inhale 1-2 puffs into the lungs every 6 (six) hours as needed for wheezing  or shortness of breath. 1 g 1  . calcium carbonate (OS-CAL - DOSED IN MG OF ELEMENTAL CALCIUM) 1250 (500 Ca) MG tablet Take 1 tablet by mouth daily.    . cholecalciferol (VITAMIN D) 1000 units tablet Take 2,000 Units by mouth daily.    . cyanocobalamin 500 MCG tablet Take 500 mcg by mouth daily.    Marland Kitchen escitalopram (LEXAPRO) 5 MG tablet TAKE 1 TABLET BY MOUTH EVERY DAY 90 tablet 3  . furosemide (LASIX) 20 MG tablet TAKE 1 TABLET BY MOUTH EVERY DAY 90 tablet 0  . gabapentin (NEURONTIN) 100 MG capsule TAKE 1 CAPSULE BY MOUTH THREE TIMES A DAY 270 capsule 2  . predniSONE (DELTASONE) 50 MG tablet One a day 5 tablet 0  . Teriflunomide (AUBAGIO) 14 MG TABS Take 14 mg by mouth daily.    Marland Kitchen zolpidem (AMBIEN) 10 MG tablet TAKE 0.5-1 TABLETS (5-10 MG TOTAL) BY MOUTH AT BEDTIME AS NEEDED FOR SLEEP. 90 tablet 0   No current facility-administered medications on file prior to visit.     ALLERGIES: No Known Allergies  FAMILY HISTORY: Family History  Problem Relation Age of Onset  . Heart disease Mother   . Hypertension Mother   . Parkinson's disease Mother   . Parkinsonism Mother   . Cancer Maternal Grandmother        breast  . Breast cancer Maternal Grandmother   . Asthma Father    SOCIAL HISTORY: Social History   Socioeconomic History  . Marital status: Married    Spouse name: Not on file  . Number of children: Not on file  . Years of education: Not on file  . Highest education level: Not on file  Occupational History  . Not on file  Social Needs  . Financial resource strain: Not on file  . Food insecurity    Worry: Not on file    Inability: Not on file  . Transportation needs    Medical: Not on file    Non-medical: Not on file  Tobacco Use  . Smoking status: Former Research scientist (life sciences)  . Smokeless tobacco: Never Used  Substance and Sexual Activity  . Alcohol use: Yes    Alcohol/week: 0.0 standard drinks    Comment: 1 to 2 glass of wine at least 4 nights a week  . Drug use: No  . Sexual  activity: Not on file  Lifestyle  . Physical activity    Days per week: Not on file    Minutes per session: Not on file  . Stress: Not on file  Relationships  . Social Herbalist on phone: Not on file    Gets together: Not on file    Attends religious service: Not on file    Active member of club or organization: Not on file    Attends meetings of clubs or organizations: Not on file    Relationship status: Not on file  . Intimate partner violence    Fear of current or ex partner: Not on file    Emotionally abused: Not on file    Physically abused: Not on file    Forced sexual activity: Not on file  Other Topics Concern  . Not on file  Social History Narrative   Married.   Moved here from [place] after 35 years   Enjoys arts and crafts. Taking a painting class.   Walking with her spouse.    Reading, shopping.    REVIEW OF SYSTEMS: Constitutional: fatigue Eyes: No visual changes, double vision, eye pain Ear, nose and throat: No hearing loss, ear pain, nasal congestion, sore throat Cardiovascular: No chest pain, palpitations Respiratory:  No shortness of breath at rest or with exertion, wheezes GastrointestinaI: No nausea, vomiting, diarrhea, abdominal pain, fecal incontinence Genitourinary:  No dysuria, urinary retention or frequency Musculoskeletal:  No neck pain, back pain Integumentary: No rash, pruritus, skin lesions Neurological: as above Psychiatric: No depression, insomnia, anxiety Endocrine: fluctuation in temperature Hematologic/Lymphatic:  No purpura, petechiae. Allergic/Immunologic: no itchy/runny eyes, nasal congestion, recent allergic reactions, rashes  PHYSICAL EXAM: Blood pressure 113/75, pulse 74, height 5\' 6"  (1.676 m), weight 131 lb (59.4 kg), SpO2 98 %. General: No acute distress.  Patient appears well-groomed.   Head:  Normocephalic/atraumatic Eyes:  Fundi examined but not visualized Neck: supple, no paraspinal tenderness, full range of  motion Heart:  Regular rate and rhythm Lungs:  Clear to auscultation bilaterally Back: No paraspinal tenderness Neurological Exam: alert and oriented to person, place, and time. Attention span and concentration intact, recent and remote memory intact, fund of knowledge intact.  Speech fluent and not dysarthric, language intact.  Peripheral vision loss in right eye.  Otherwise, CN II-XII intact. Bulk and tone normal, muscle strength 5-/5 deltoids secondary to pain, otherwise 5/5 throughout.  Sensation to light touch, temperature and vibration intact.  Deep tendon reflexes 2+ in upper extremities, 3+ lower extremities, toes downgoing.  Finger to nose and heel to shin testing intact.  Gait normal, Romberg negative.  IMPRESSION: Multiple sclerosis  PLAN: 1.  Aubagio and D3 2.  Repeat CBC with diff, CMP and D in 6 months 3.  Follow up in 6 months  25 minutes spent face to face with patient, over 50% spent discussing management.   Metta Clines, DO  CC: Alma Friendly, NP

## 2019-07-24 ENCOUNTER — Encounter: Payer: Self-pay | Admitting: Neurology

## 2019-07-24 ENCOUNTER — Other Ambulatory Visit: Payer: Self-pay

## 2019-07-24 ENCOUNTER — Ambulatory Visit (INDEPENDENT_AMBULATORY_CARE_PROVIDER_SITE_OTHER): Payer: PPO | Admitting: Neurology

## 2019-07-24 VITALS — BP 113/75 | HR 74 | Ht 66.0 in | Wt 131.0 lb

## 2019-07-24 DIAGNOSIS — G35 Multiple sclerosis: Secondary | ICD-10-CM

## 2019-07-24 NOTE — Patient Instructions (Addendum)
1.  Continue Aubagio 2.  Continue D3 2000 IU daily 3.  Repeat CBC with diff, CMP and vitamin D in 6 months followed up follow up with me.

## 2019-07-28 DIAGNOSIS — Z23 Encounter for immunization: Secondary | ICD-10-CM

## 2019-07-28 MED ORDER — ZOSTER VAC RECOMB ADJUVANTED 50 MCG/0.5ML IM SUSR: 0.5000 mL | Freq: Once | INTRAMUSCULAR | 1 refills | Status: AC

## 2019-08-05 ENCOUNTER — Other Ambulatory Visit: Payer: Self-pay | Admitting: Primary Care

## 2019-08-05 DIAGNOSIS — F3342 Major depressive disorder, recurrent, in full remission: Secondary | ICD-10-CM

## 2019-08-11 ENCOUNTER — Other Ambulatory Visit: Payer: Self-pay | Admitting: Internal Medicine

## 2019-08-11 DIAGNOSIS — R918 Other nonspecific abnormal finding of lung field: Secondary | ICD-10-CM

## 2019-08-14 ENCOUNTER — Other Ambulatory Visit: Payer: Self-pay | Admitting: Primary Care

## 2019-08-14 DIAGNOSIS — R609 Edema, unspecified: Secondary | ICD-10-CM

## 2019-08-16 IMAGING — CT CT CHEST W/O CM
2 of 4 series · 15 of 36 positions shown, 18 images · non-contrast
Comparison: Chest CT, 04/23/2018.

CLINICAL DATA: Pt stated she is here for lung nodule that was found
in [REDACTED]. Here for follow up exam makes sure there has not been any
exertion no chest pains and does have hx of smoking for 30 years.

EXAM:
CT CHEST WITHOUT CONTRAST
TECHNIQUE: Multidetector CT imaging of the chest was performed following the
standard protocol without IV contrast.

[Series 2: chest · axial · 0.56mm/px · z∈[-1291,-987]mm · 12 of 180 slices shown, 15 images (1 of 2)]
[im 14/180  mediastinal]
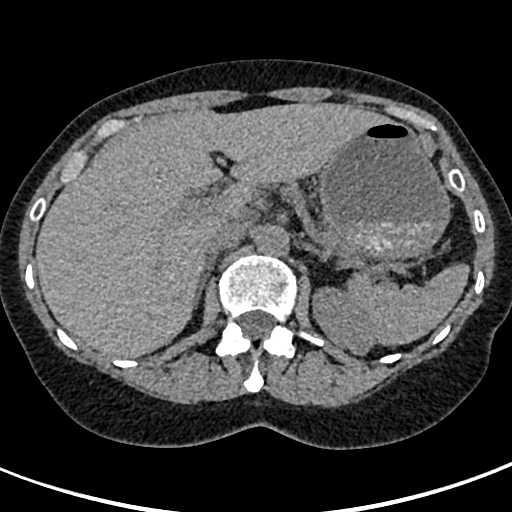
[im 14/180  lung]
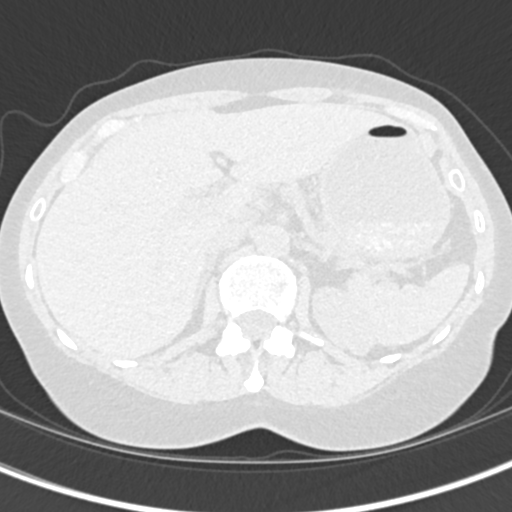
[im 28/180  lung]
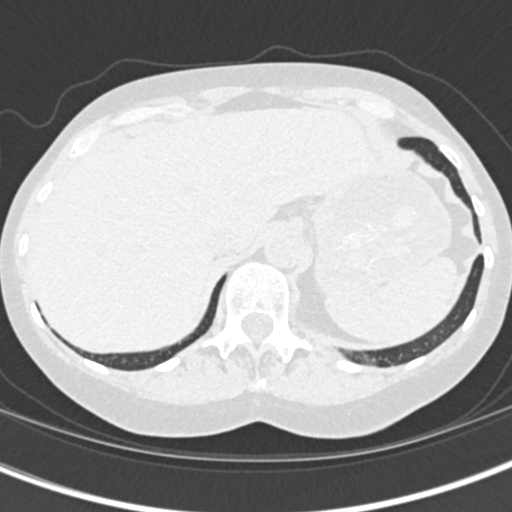
[im 42/180  lung]
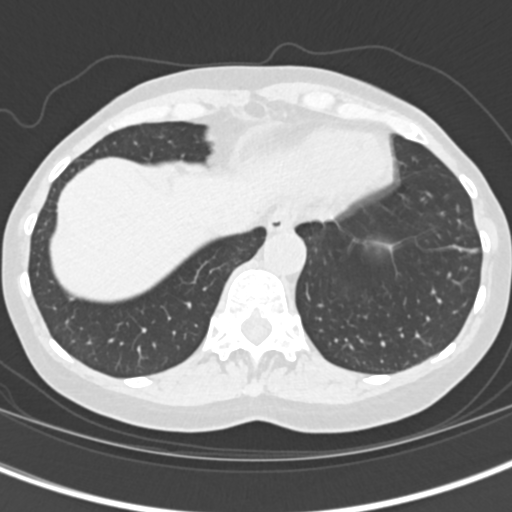
[im 56/180  lung]
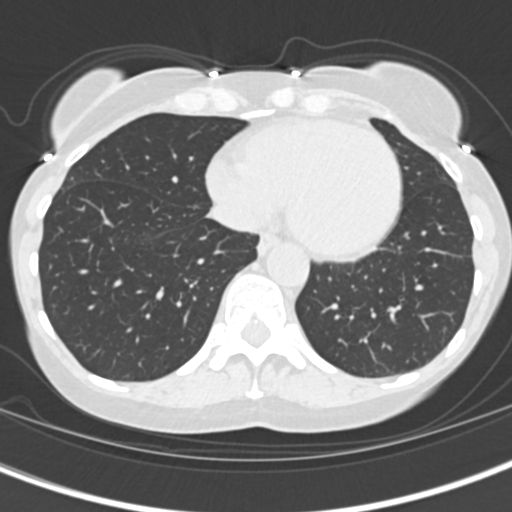
[im 69/180  mediastinal]
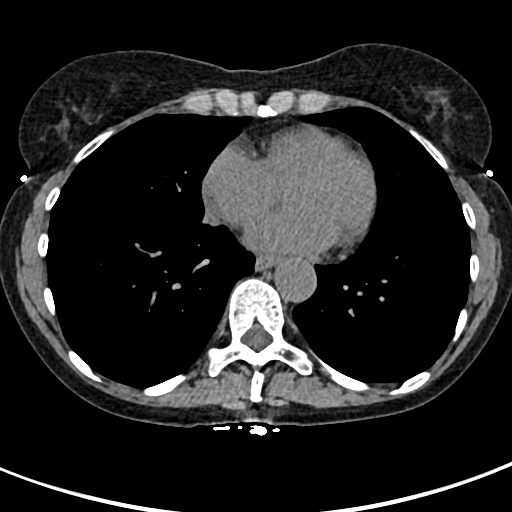
[im 69/180  lung]
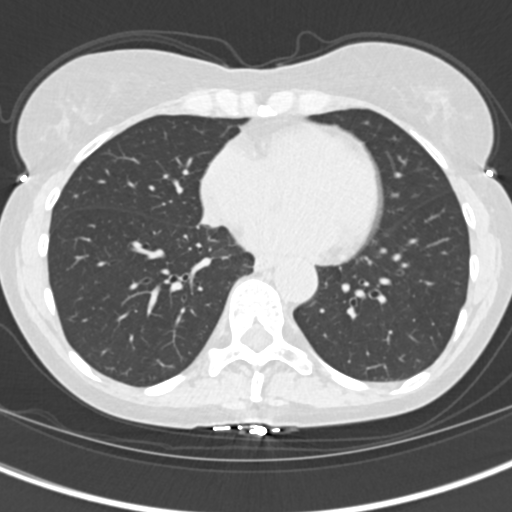
[im 83/180  lung]
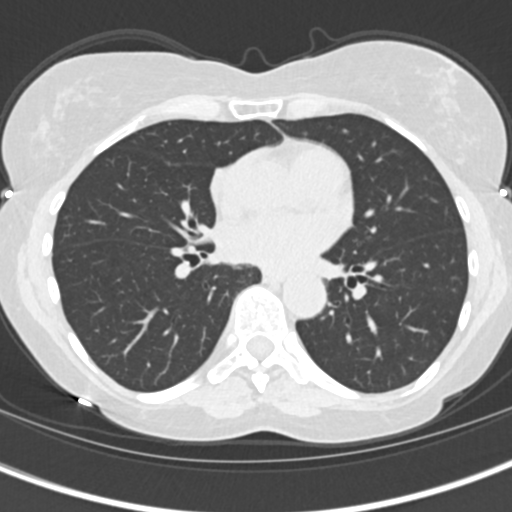
[im 97/180  lung]
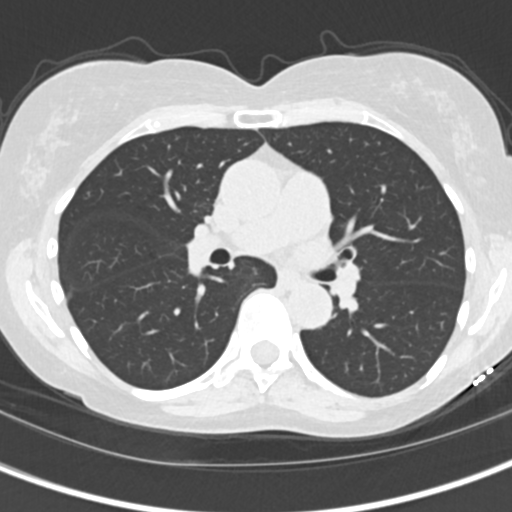
[im 111/180  lung]
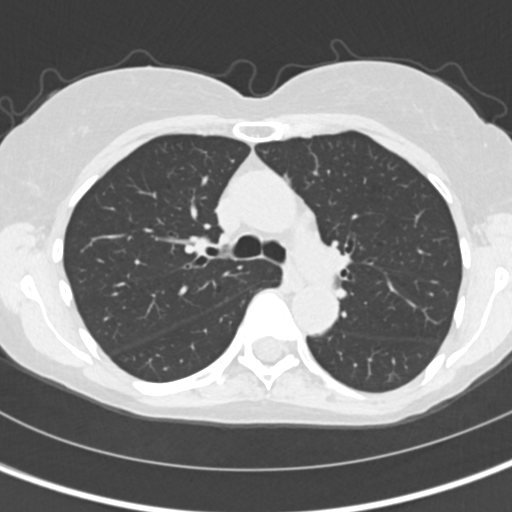
[im 124/180  mediastinal]
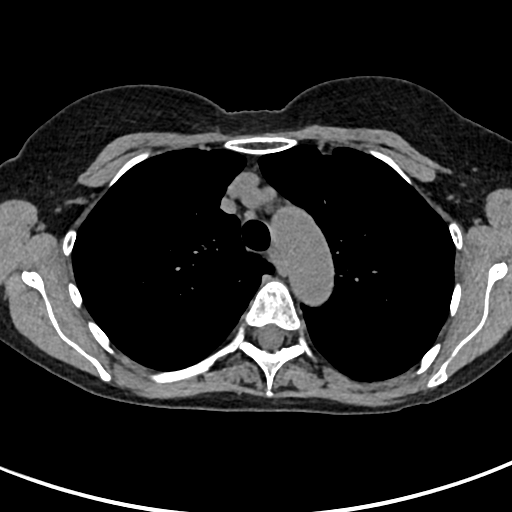
[im 124/180  lung]
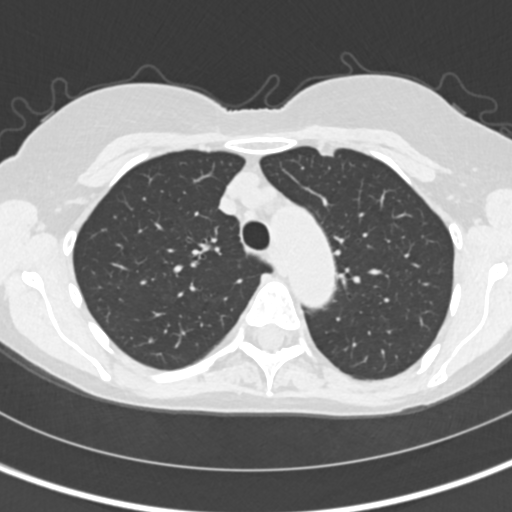
[im 138/180  lung]
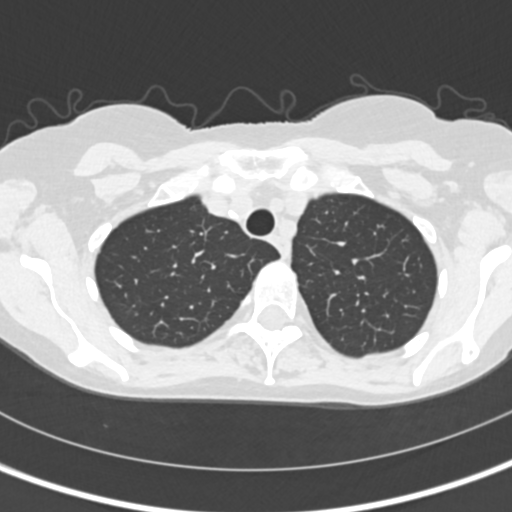
[im 152/180  lung]
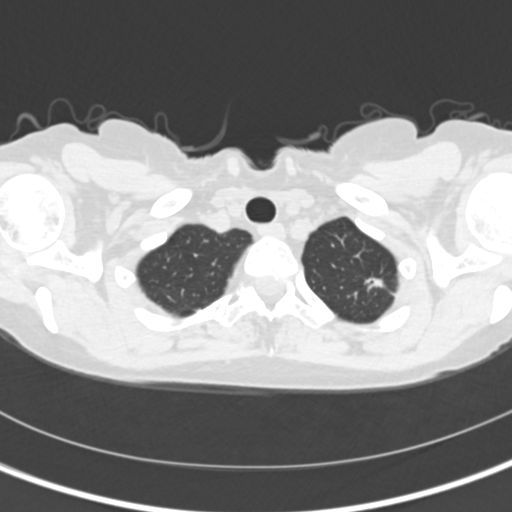
[im 166/180  lung]
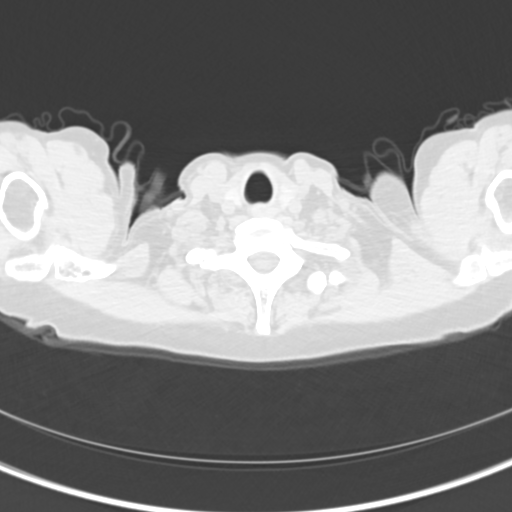

[Series 5: chest · coronal · 0.56mm/px · 3 of 143 slices shown (2 of 2)]
[im 29/143  lung]
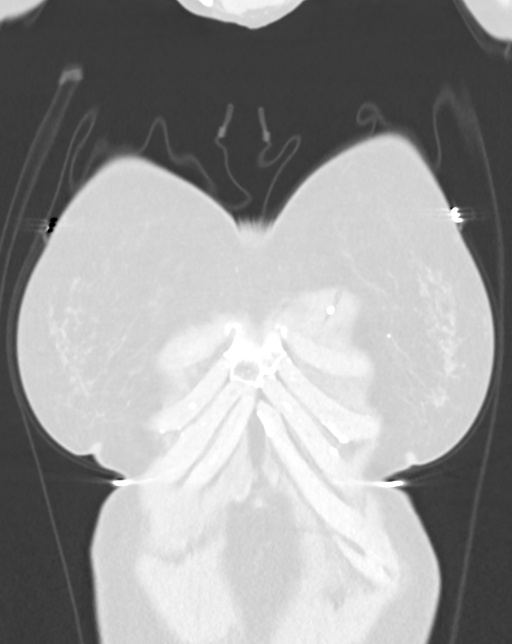
[im 57/143  lung]
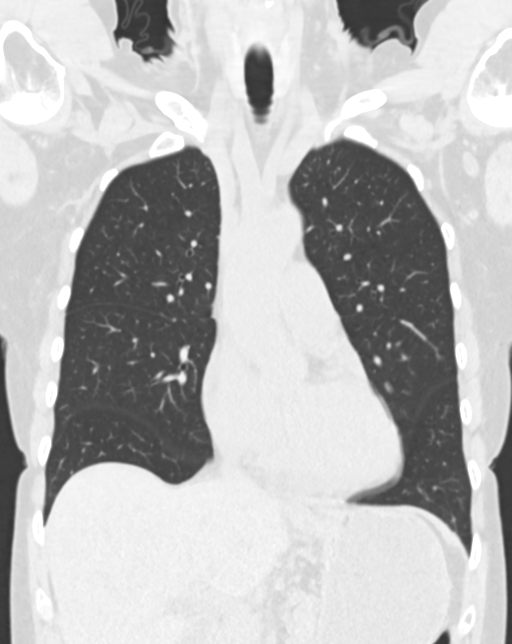
[im 86/143  lung]
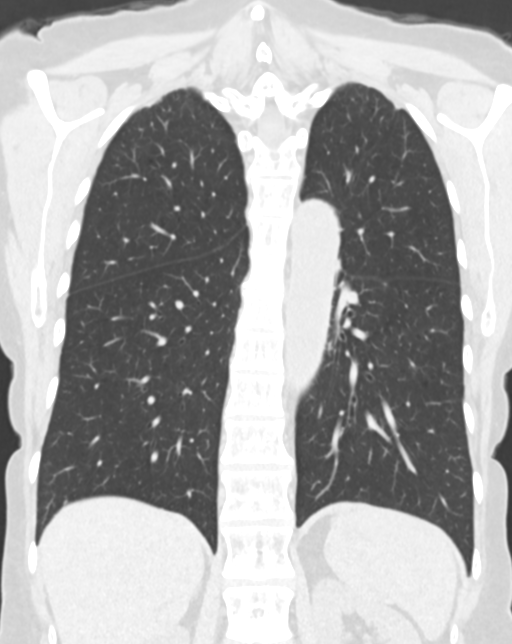

[15 of 36 positions shown; findings below may reference images not displayed]

FINDINGS: Cardiovascular: Heart is normal in size and configuration. No
pericardial effusion. No coronary artery calcifications. Great
vessels normal caliber. Minor atherosclerotic calcifications noted
along the aortic arch.

Mediastinum/Nodes: No enlarged mediastinal or axillary lymph nodes.
Thyroid gland, trachea, and esophagus demonstrate no significant
findings.

Lungs/Pleura: There is focal nodular opacity contiguous with pleural
based opacity the left apex measuring 10 x 6 mm, mean 8 mm,
unchanged from the prior exam allowing for differences in
measurement technique.

No other nodules. Is mild pleuroparenchymal scarring at both apices,
slightly greater on the left.

Minor linear subsegmental atelectasis at the left lateral lung base.

No evidence of pneumonia or pulmonary edema. No pleural effusion or
pneumothorax.

Upper Abdomen: Unremarkable.

Musculoskeletal: No chest wall mass or suspicious bone lesions
identified.
IMPRESSION: 1. Previously described left upper lobe 9 mm nodule is without
change prior chest CT. It is contiguous with pleural based opacity
consistent with apical pleuroparenchymal scarring. There is milder
apical pleuroparenchymal scarring on the right. The nodule is likely
also due to scarring. Recommend 1 additional follow-up chest CT in
12 months to document longer term stability per Fleischner criteria.
2. There are no new nodules. There are no acute findings and there
has been no interval change since the prior CT.

Aortic Atherosclerosis (1HPO0-ZAR.R).

## 2019-09-04 DIAGNOSIS — Z20828 Contact with and (suspected) exposure to other viral communicable diseases: Secondary | ICD-10-CM | POA: Diagnosis not present

## 2019-09-09 NOTE — Progress Notes (Signed)
Hard fax from [employer] 14 MG   The coverage request has been approved through 09/30/2020  [phone]

## 2019-10-27 ENCOUNTER — Ambulatory Visit: Payer: PPO

## 2019-10-30 ENCOUNTER — Ambulatory Visit: Payer: PPO

## 2019-11-05 ENCOUNTER — Ambulatory Visit: Payer: PPO

## 2019-11-10 ENCOUNTER — Other Ambulatory Visit: Payer: Self-pay | Admitting: Primary Care

## 2019-11-10 DIAGNOSIS — Z1231 Encounter for screening mammogram for malignant neoplasm of breast: Secondary | ICD-10-CM

## 2019-11-11 ENCOUNTER — Other Ambulatory Visit: Payer: Self-pay

## 2019-11-11 ENCOUNTER — Ambulatory Visit
Admission: RE | Admit: 2019-11-11 | Discharge: 2019-11-11 | Disposition: A | Discharge status: Home / Self Care | Payer: PPO | Source: Ambulatory Visit

## 2019-11-11 DIAGNOSIS — Z1231 Encounter for screening mammogram for malignant neoplasm of breast: Secondary | ICD-10-CM | POA: Diagnosis not present

## 2019-11-13 ENCOUNTER — Ambulatory Visit: Payer: PPO

## 2019-11-13 ENCOUNTER — Other Ambulatory Visit: Payer: Self-pay

## 2019-11-13 DIAGNOSIS — G4700 Insomnia, unspecified: Secondary | ICD-10-CM

## 2019-11-14 NOTE — Telephone Encounter (Signed)
Last filled 01-20-19 #90 Last OV 07-11-18 No Future OVs [facility]

## 2019-11-16 ENCOUNTER — Ambulatory Visit: Payer: PPO

## 2019-11-16 NOTE — Telephone Encounter (Signed)
Cannot refill due to last office visit. My chart message sent for patient to schedule.

## 2019-11-18 ENCOUNTER — Encounter: Payer: Self-pay | Admitting: Primary Care

## 2019-11-18 ENCOUNTER — Ambulatory Visit (INDEPENDENT_AMBULATORY_CARE_PROVIDER_SITE_OTHER): Payer: PPO | Admitting: Primary Care

## 2019-11-18 ENCOUNTER — Other Ambulatory Visit: Payer: Self-pay

## 2019-11-18 VITALS — BP 118/78 | HR 78 | Temp 97.2°F | Ht 66.0 in | Wt 130.5 lb

## 2019-11-18 DIAGNOSIS — G4700 Insomnia, unspecified: Secondary | ICD-10-CM | POA: Diagnosis not present

## 2019-11-18 DIAGNOSIS — G35 Multiple sclerosis: Secondary | ICD-10-CM | POA: Diagnosis not present

## 2019-11-18 DIAGNOSIS — F330 Major depressive disorder, recurrent, mild: Secondary | ICD-10-CM

## 2019-11-18 DIAGNOSIS — R0602 Shortness of breath: Secondary | ICD-10-CM | POA: Diagnosis not present

## 2019-11-18 MED ORDER — ZOLPIDEM TARTRATE 10 MG PO TABS: 5.0000 mg | ORAL_TABLET | Freq: Every evening | ORAL | 0 refills | Status: DC | PRN

## 2019-11-18 MED ORDER — ESCITALOPRAM OXALATE 10 MG PO TABS: 10.0000 mg | ORAL_TABLET | Freq: Every day | ORAL | 0 refills | Status: DC

## 2019-11-18 NOTE — Progress Notes (Signed)
Subjective:    Patient ID: Morgan Herrera, female    DOB: April 20, 1951, 69 y.o.   MRN: [id number]  HPI  This visit occurred during the SARS-CoV-2 public health emergency.  Safety protocols were in place, including screening questions prior to the visit, additional usage of staff PPE, and extensive cleaning of exam room while observing appropriate contact time as indicated for disinfecting solutions.   Morgan Herrera is a 69 year old female who presents today for follow up.  1) Multiple Sclerosis: Following with neurology with her last visit being in Fall 2020. Managed on Augagio for MS, gabapentin for restless legs. Overall doing well on her current regimen.   2) Depression: Currently managed on Lexaro 5 mg but thinks she may need a dose increase. She's noticed symptoms of irritability, mood swings, less patience over the last several months. Denies SI/HI. She is doing well on 1/2 tablet of Ambien nightly and is needing a refill today.  3) Shortness of Breath: Using albuterol inhaler sparingly as needed. Evaluated by pulmonology and cardiology previously who couldn't find a cause for symptoms. She is compliant to her furosemide 20 mg daily.  Wt Readings from Last 3 Encounters:  11/18/19 130 lb 8 oz (59.2 kg)  07/24/19 131 lb (59.4 kg)  02/26/19 129 lb (58.5 kg)   BP Readings from Last 3 Encounters:  11/18/19 118/78  07/24/19 113/75  05/07/19 115/87     Review of Systems  Respiratory: Negative for shortness of breath.   Cardiovascular: Negative for chest pain.  Neurological: Negative for dizziness and headaches.  Psychiatric/Behavioral:       See HPI       Past Medical History:  Diagnosis Date  . (HFpEF) heart failure with preserved ejection fraction ([place])    a. 03/2018 Echo: EF 55-60%, no rwma. Mod MR. Nl PASP.  Marland Kitchen Chest pain    a. 03/2018 Ex MV: rest only imaging showed ant wall perfusion defect. No stress imaging as pt developed hypotension w/ exercise; b. 05/2018 Cath: Nl  cors w/ sluggish flow in teh LAD suggestive of endothelial dysfxn. EF 55-65%.  . Migraine   . Multiple sclerosis ([place])   . Neuromuscular disorder ([place])   . PMR (polymyalgia rheumatica) ([facility])   . Pulmonary nodule    a. 03/2018 CTA Chest: 8mm small spiculated nodular density @ the L lung apex. Rec f/u in 3 mos.     Social History   Socioeconomic History  . Marital status: Married    Spouse name: Not on file  . Number of children: 1  . Years of education: Not on file  . Highest education level: Bachelor's degree (e.g., BA, AB, BS)  Occupational History  . Not on file  Tobacco Use  . Smoking status: Former Research scientist (life sciences)  . Smokeless tobacco: Never Used  Substance and Sexual Activity  . Alcohol use: Yes    Alcohol/week: 0.0 standard drinks    Comment: 1 to 2 glass of wine at least 4 nights a week  . Drug use: No  . Sexual activity: Not on file  Other Topics Concern  . Not on file  Social History Narrative   Married.   Moved here from [place] after 35 years   Enjoys arts and crafts. Taking a painting class.   Walking with her spouse.    Reading, shopping.   Social Determinants of Health   Financial Resource Strain:   . Difficulty of Paying Living Expenses: Not on file  Food Insecurity:   .  Worried About Charity fundraiser in the Last Year: Not on file  . Ran Out of Food in the Last Year: Not on file  Transportation Needs:   . Lack of Transportation (Medical): Not on file  . Lack of Transportation (Non-Medical): Not on file  Physical Activity:   . Days of Exercise per Week: Not on file  . Minutes of Exercise per Session: Not on file  Stress:   . Feeling of Stress : Not on file  Social Connections:   . Frequency of Communication with Friends and Family: Not on file  . Frequency of Social Gatherings with Friends and Family: Not on file  . Attends Religious Services: Not on file  . Active Member of Clubs or Organizations: Not on file  . Attends Archivist Meetings:  Not on file  . Marital Status: Not on file  Intimate Partner Violence:   . Fear of Current or Ex-Partner: Not on file  . Emotionally Abused: Not on file  . Physically Abused: Not on file  . Sexually Abused: Not on file    Past Surgical History:  Procedure Laterality Date  . BREAST EXCISIONAL BIOPSY    . BREAST SURGERY  1990  . COLONOSCOPY WITH PROPOFOL N/A 05/19/2015   Procedure: COLONOSCOPY WITH PROPOFOL;  Surgeon: Hulen Luster, MD;  Location: [facility] ENDOSCOPY;  Service: Gastroenterology;  Laterality: N/A;  . EYE SURGERY     1978 1st of 3 eye surgeries  . LEFT HEART CATH AND CORONARY ANGIOGRAPHY N/A 04/25/2018   Procedure: LEFT HEART CATH AND CORONARY ANGIOGRAPHY;  Surgeon: Wellington Hampshire, MD;  Location: [facility];  Service: Cardiovascular;  Laterality: N/A;  . TENDON REPAIR      Family History  Problem Relation Age of Onset  . Heart disease Mother   . Hypertension Mother   . Parkinson's disease Mother   . Parkinsonism Mother   . Cancer Maternal Grandmother        breast  . Breast cancer Maternal Grandmother   . Asthma Father     No Known Allergies  Current Outpatient Medications on File Prior to Visit  Medication Sig Dispense Refill  . albuterol (VENTOLIN HFA) 108 (90 Base) MCG/ACT inhaler Inhale 1-2 puffs into the lungs every 6 (six) hours as needed for wheezing or shortness of breath. 1 g 1  . calcium carbonate (OS-CAL - DOSED IN MG OF ELEMENTAL CALCIUM) 1250 (500 Ca) MG tablet Take 1 tablet by mouth daily.    . cholecalciferol (VITAMIN D) 1000 units tablet Take 2,000 Units by mouth daily.    . cyanocobalamin 500 MCG tablet Take 500 mcg by mouth daily.    . furosemide (LASIX) 20 MG tablet TAKE 1 TABLET BY MOUTH EVERY DAY 90 tablet 1  . gabapentin (NEURONTIN) 100 MG capsule TAKE 1 CAPSULE BY MOUTH THREE TIMES A DAY 270 capsule 2  . Teriflunomide (AUBAGIO) 14 MG TABS Take 14 mg by mouth daily.     No current facility-administered medications on file prior to  visit.    BP 118/78   Pulse 78   Temp (!) 97.2 F (36.2 C) (Temporal)   Ht 5\' 6"  (1.676 m)   Wt 130 lb 8 oz (59.2 kg)   SpO2 98%   BMI 21.06 kg/m    Objective:   Physical Exam  Constitutional: She appears well-nourished.  Cardiovascular: Normal rate and regular rhythm.  Respiratory: Effort normal and breath sounds normal.  Musculoskeletal:     Cervical  back: Neck supple.  Skin: Skin is warm and dry.  Psychiatric: She has a normal mood and affect.           Assessment & Plan:

## 2019-11-18 NOTE — Assessment & Plan Note (Signed)
Doing well on current regimen, following with neurology. Continue gabapentin and Aubagio.

## 2019-11-18 NOTE — Assessment & Plan Note (Signed)
Doing well on PRN albuterol inhaler, continue same.  

## 2019-11-18 NOTE — Assessment & Plan Note (Signed)
Doing well on zolpidem, refill sent to pharmacy. Continue same.

## 2019-11-18 NOTE — Assessment & Plan Note (Signed)
Overall doing well on Lexapro, increased symptoms over the last several months. Agree to increase dose of Lexapro to 10 mg, new Rx sent to pharmacy. She will update.

## 2019-11-18 NOTE — Patient Instructions (Signed)
We've increased the dose of your Lexapro to 10 mg. I sent a new prescription to your pharmacy.  It was a pleasure to see you today!

## 2019-12-24 ENCOUNTER — Other Ambulatory Visit: Payer: PPO

## 2019-12-24 ENCOUNTER — Other Ambulatory Visit: Payer: Self-pay

## 2019-12-24 ENCOUNTER — Other Ambulatory Visit (INDEPENDENT_AMBULATORY_CARE_PROVIDER_SITE_OTHER): Payer: PPO

## 2019-12-24 DIAGNOSIS — G35 Multiple sclerosis: Secondary | ICD-10-CM | POA: Diagnosis not present

## 2019-12-24 LAB — COMPLETE METABOLIC PANEL WITH GFR
AG Ratio: 2.3 (calc) (ref 1.0–2.5)
ALT: 19 U/L (ref 6–29)
AST: 23 U/L (ref 10–35)
Albumin: 4.3 g/dL (ref 3.6–5.1)
Alkaline phosphatase (APISO): 49 U/L (ref 37–153)
BUN: 24 mg/dL (ref 7–25)
CO2: 30 mmol/L (ref 20–32)
Calcium: 10 mg/dL (ref 8.6–10.4)
Chloride: 104 mmol/L (ref 98–110)
Creat: 0.78 mg/dL (ref 0.50–0.99)
GFR, Est African American: 91 mL/min/{1.73_m2} (ref >=60)
GFR, Est Non African American: 78 mL/min/{1.73_m2} (ref >=60)
Globulin: 1.9 g/dL (calc) (ref 1.9–3.7)
Glucose, Bld: 122 mg/dL — ABNORMAL HIGH (ref 65–99)
Potassium: 4.6 mmol/L (ref 3.5–5.3)
Sodium: 142 mmol/L (ref 135–146)
Total Bilirubin: 0.4 mg/dL (ref 0.2–1.2)
Total Protein: 6.2 g/dL (ref 6.1–8.1)

## 2019-12-24 LAB — VITAMIN D 25 HYDROXY (VIT D DEFICIENCY, FRACTURES): VITD: 59.53 ng/mL (ref 30.00–100.00)

## 2019-12-25 LAB — CBC WITH DIFFERENTIAL
Basophils Absolute: 0.1 10*3/uL (ref 0.0–0.2)
Basos: 2 %
EOS (ABSOLUTE): 0.2 10*3/uL (ref 0.0–0.4)
Eos: 4 %
Hematocrit: 38.4 % (ref 34.0–46.6)
Hemoglobin: 12.5 g/dL (ref 11.1–15.9)
Immature Grans (Abs): 0 10*3/uL (ref 0.0–0.1)
Immature Granulocytes: 0 %
Lymphocytes Absolute: 1.1 10*3/uL (ref 0.7–3.1)
Lymphs: 22 %
MCH: 28.5 pg (ref 26.6–33.0)
MCHC: 32.6 g/dL (ref 31.5–35.7)
MCV: 88 fL (ref 79–97)
Monocytes Absolute: 0.6 10*3/uL (ref 0.1–0.9)
Monocytes: 12 %
Neutrophils Absolute: 3 10*3/uL (ref 1.4–7.0)
Neutrophils: 60 %
RBC: 4.38 x10E6/uL (ref 3.77–5.28)
RDW: 13.3 % (ref 11.7–15.4)
WBC: 5.1 10*3/uL (ref 3.4–10.8)

## 2019-12-28 ENCOUNTER — Telehealth: Payer: Self-pay

## 2019-12-28 NOTE — Telephone Encounter (Signed)
Pt came for 01/28/20 appt thinking it was 12/28/19. Pt states had labs 12/24/19. Please advise if repeat labs needed or if labs done can be used for visit 01/28/20. Verified pt phone.

## 2019-12-28 NOTE — Telephone Encounter (Signed)
Spoke with patient and advised that per last office note it does not look like additional lab will be needed prior to appointment.  Patient agreeable.

## 2019-12-29 ENCOUNTER — Telehealth: Payer: Self-pay | Admitting: Primary Care

## 2019-12-29 NOTE — Progress Notes (Signed)
  Chronic Care Management   Outreach Note  12/29/2019 Name: Morgan Herrera MRN: [id number] DOB: [birth year]  Referred by: Pleas Koch, NP Reason for referral : No chief complaint on file.   An unsuccessful telephone outreach was attempted today. The patient was referred to the pharmacist for assistance with care management and care coordination.   Follow Up Plan:   [name] [employer] Scheduler

## 2020-01-27 NOTE — Progress Notes (Signed)
NEUROLOGY FOLLOW UP OFFICE NOTE  LATRELLE FENNING CZ:[id number]  HISTORY OF PRESENT ILLNESS: Brytani Dohn is a 69 year old right-handed woman with multiple sclerosis, polymyalgia rheumatica and migraine who follows up for multiple sclerosis.  UPDATE: Current medications: Aubagio, B12 1084mcg daily, gabapentin 100mg  TID PRN; Lexapro 10mg  daily; Ambien; D3 2000 IU daily.   12/24/2019 LABS:  CBC with WBC 5.1, HGB 12.5, HCT 38.4, ALC 1.1; CMP with Na 142, K 4.6, Cl 104, CO2 30, glucose 122, BUN 24, Cr 0.78, t bili 0.4, ALP 49, AST 23, ALT 19; vitamin D 59.53  Vision: Chronic right sided peripheral vision loss Motor:  No concerns Sensory:  none Pain:  Left leg aches.  No back pain. Gait:  none Bowel/Bladder:  No concerns Fatigue:  She feels more tired than usual but it is not affecting her quality of life.  She sleeps well. Cognition:  No concerns Mood:  No concerns She still has a chronic cough and slight shortness of breath with over-exertion.    She participates in yoga and walking.  Still getting hot flashes.     HISTORY: She was diagnosed with MS at age 63, when she presented with bilateral optic neuritis, right worse than left. She was treated with IV steroids at that time.. In hindsight, she recalls brief episodes from her past. In her late-20s, she had a 3 week episode of dizziness and ataxia. She had brief episodes of limb paresthesias lasting a couple of days.  Work up included MRIs. MRIs of the brain have shown lesions suspicious for MS. Prior cervical MRI reportedly normal.  She never had a relapse and therefore never required further IV steroids. However, she was diagnosed with polymyalgia rheumatica several years ago, after experiencing severe joint pain and stiffness.   She occasionally has fatigue once in a while, sometimes lasting for 2 weeks. She does not exhibit Lhermitte's sign. She reports burning and aching on the bottom of her feet when she  walks for prolonged period.  She works part-time as a Radiation protection practitioner. For the past couple of years, she reports word-finding difficulty. Sometimes it is difficult to concentrate. It has almost affected her work at times. She has a BA in [place]. She underwent neuropsychological testing in July 2017, which revealed anxiety and depression but no cognitive impairment.  Prior disease modifying drugs: Avonex (tired of injections), Tecfidera (nausea, flushing and unable to take aspirin)  09/06/15:MRI of brain with and without contrastshowedchronic non-enhancing T2/FLAIR hyperintensities in the periventricular white matter, some demonstrating Dawson's fingers, with mild corpus callosum involvement, which appears stable compared to 2012 and similar to imaging from 2005. 10/09/16:MRI of brain with and without contrast was stable. 12/28/17:MRI of brain with and without contrast was personally reviewed andis stable compared to prior imaging from 2016.  PAST MEDICAL HISTORY: Past Medical History:  Diagnosis Date  . (HFpEF) heart failure with preserved ejection fraction ([place])    a. 03/2018 Echo: EF 55-60%, no rwma. Mod MR. Nl PASP.  Marland Kitchen Chest pain    a. 03/2018 Ex MV: rest only imaging showed ant wall perfusion defect. No stress imaging as pt developed hypotension w/ exercise; b. 05/2018 Cath: Nl cors w/ sluggish flow in teh LAD suggestive of endothelial dysfxn. EF 55-65%.  . Migraine   . Multiple sclerosis ([place])   . Neuromuscular disorder ([place])   . PMR (polymyalgia rheumatica) (H[facility])   . Pulmonary nodule    a. 03/2018 CTA Chest: 64mm small spiculated nodular density @ the L lung  apex. Rec f/u in 3 mos.    MEDICATIONS: Current Outpatient Medications on File Prior to Visit  Medication Sig Dispense Refill  . albuterol (VENTOLIN HFA) 108 (90 Base) MCG/ACT inhaler Inhale 1-2 puffs into the lungs every 6 (six) hours as needed for wheezing or shortness of breath. 1 g 1  . calcium carbonate (OS-CAL -  DOSED IN MG OF ELEMENTAL CALCIUM) 1250 (500 Ca) MG tablet Take 1 tablet by mouth daily.    . cholecalciferol (VITAMIN D) 1000 units tablet Take 2,000 Units by mouth daily.    . cyanocobalamin 500 MCG tablet Take 500 mcg by mouth daily.    Marland Kitchen escitalopram (LEXAPRO) 10 MG tablet Take 1 tablet (10 mg total) by mouth daily. For depression. 90 tablet 0  . furosemide (LASIX) 20 MG tablet TAKE 1 TABLET BY MOUTH EVERY DAY 90 tablet 1  . gabapentin (NEURONTIN) 100 MG capsule TAKE 1 CAPSULE BY MOUTH THREE TIMES A DAY 270 capsule 2  . Teriflunomide (AUBAGIO) 14 MG TABS Take 14 mg by mouth daily.    Marland Kitchen zolpidem (AMBIEN) 10 MG tablet Take 0.5-1 tablets (5-10 mg total) by mouth at bedtime as needed for sleep. 90 tablet 0   No current facility-administered medications on file prior to visit.    ALLERGIES: No Known Allergies  FAMILY HISTORY: Family History  Problem Relation Age of Onset  . Heart disease Mother   . Hypertension Mother   . Parkinson's disease Mother   . Parkinsonism Mother   . Cancer Maternal Grandmother        breast  . Breast cancer Maternal Grandmother   . Asthma Father    SOCIAL HISTORY: Social History   Socioeconomic History  . Marital status: Married    Spouse name: Not on file  . Number of children: 1  . Years of education: Not on file  . Highest education level: Bachelor's degree (e.g., BA, AB, BS)  Occupational History  . Not on file  Tobacco Use  . Smoking status: Former Research scientist (life sciences)  . Smokeless tobacco: Never Used  Substance and Sexual Activity  . Alcohol use: Yes    Alcohol/week: 0.0 standard drinks    Comment: 1 to 2 glass of wine at least 4 nights a week  . Drug use: No  . Sexual activity: Not on file  Other Topics Concern  . Not on file  Social History Narrative   Married.   Moved here from [place] after 35 years   Enjoys arts and crafts. Taking a painting class.   Walking with her spouse.    Reading, shopping.   Social Determinants of Health    Financial Resource Strain:   . Difficulty of Paying Living Expenses:   Food Insecurity:   . Worried About Charity fundraiser in the Last Year:   . Arboriculturist in the Last Year:   Transportation Needs:   . Film/video editor (Medical):   Marland Kitchen Lack of Transportation (Non-Medical):   Physical Activity:   . Days of Exercise per Week:   . Minutes of Exercise per Session:   Stress:   . Feeling of Stress :   Social Connections:   . Frequency of Communication with Friends and Family:   . Frequency of Social Gatherings with Friends and Family:   . Attends Religious Services:   . Active Member of Clubs or Organizations:   . Attends Archivist Meetings:   Marland Kitchen Marital Status:   Intimate Partner Violence:   .  Fear of Current or Ex-Partner:   . Emotionally Abused:   Marland Kitchen Physically Abused:   . Sexually Abused:      PHYSICAL EXAM: Blood pressure 119/75, pulse 61, resp. rate 18, height 5\' 6"  (1.676 m), weight 127 lb (57.6 kg), SpO2 94 %. General: No acute distress.  Patient appears well-groomed.   Head:  Normocephalic/atraumatic Eyes:  Fundi examined but not visualized Neck: supple, no paraspinal tenderness, full range of motion Heart:  Regular rate and rhythm Lungs:  Clear to auscultation bilaterally Back: No paraspinal tenderness Neurological Exam: alert and oriented to person, place, and time. Attention span and concentration intact, recent and remote memory intact, fund of knowledge intact.  Speech fluent and not dysarthric, language intact.  Peripheral vision loss in right eye.  Otherwise, CN II-XII intact. Bulk and tone normal, muscle strength 5/5 throughout.  Sensation to pinprick and vibration intact.  Deep tendon reflexes 2+ upper extremities, 3+ lower extremities, toes downgoing.  Finger to nose and heel to shin testing intact.  Gait normal, Romberg negative.  IMPRESSION: Multiple sclerosis  PLAN: 1.  Aubagio 2.  D3 2000 IU daily; escitalopram; gabapentin 3.   Repeat CBC with diff, CMP and vitamin D level in 6 months. 4.  MRI of brain and cervical spine with and without contrast in 6 months 5.  Follow up in 6 months (after tests).Metta Clines, DO  CC: Alma Friendly, NP

## 2020-01-28 ENCOUNTER — Ambulatory Visit: Payer: PPO | Admitting: Neurology

## 2020-01-28 ENCOUNTER — Encounter: Payer: Self-pay | Admitting: Neurology

## 2020-01-28 ENCOUNTER — Other Ambulatory Visit: Payer: Self-pay

## 2020-01-28 VITALS — BP 119/75 | HR 61 | Resp 18 | Ht 66.0 in | Wt 127.0 lb

## 2020-01-28 DIAGNOSIS — G35 Multiple sclerosis: Secondary | ICD-10-CM | POA: Diagnosis not present

## 2020-01-28 NOTE — Patient Instructions (Addendum)
1.  Continue Aubagio, gabapentin, escitalopram and D3 2000 IU daily 2.  Repeat CBC with diff, CMP, and vitamin D level in 6 months 3.  Repeat MRI of brain and cervical spine with and without contrast in 6 months. We have sent a referral to [facility] for your MRI and they will call you directly to schedule your appointment. They are located at [address]. If you need to contact them directly please call [phone].  4.  Follow up after repeat testing.

## 2020-02-04 ENCOUNTER — Other Ambulatory Visit: Payer: Self-pay | Admitting: Primary Care

## 2020-02-04 DIAGNOSIS — R609 Edema, unspecified: Secondary | ICD-10-CM

## 2020-02-07 ENCOUNTER — Other Ambulatory Visit: Payer: Self-pay | Admitting: Primary Care

## 2020-02-07 DIAGNOSIS — G4700 Insomnia, unspecified: Secondary | ICD-10-CM

## 2020-02-07 DIAGNOSIS — F330 Major depressive disorder, recurrent, mild: Secondary | ICD-10-CM

## 2020-02-08 ENCOUNTER — Other Ambulatory Visit: Payer: Self-pay

## 2020-02-08 DIAGNOSIS — G4700 Insomnia, unspecified: Secondary | ICD-10-CM

## 2020-02-08 NOTE — Telephone Encounter (Signed)
escitalopram (LEXAPRO) 10 mg Last prescribed on 11/18/2019 #90 with 0 refill.   zolpidem (AMBIEN) 10 mg Last prescribed on 11/18/2019 #90 with 0 refill.  Last OV on 11/18/2019. No future OV scheduled

## 2020-02-08 NOTE — Telephone Encounter (Signed)
[facility] for #30 of lexapro but will defer ambien to pcp who will return tomorrow

## 2020-02-09 MED ORDER — ZOLPIDEM TARTRATE 10 MG PO TABS: 5.0000 mg | ORAL_TABLET | Freq: Every evening | ORAL | 0 refills | Status: DC | PRN

## 2020-02-09 NOTE — Telephone Encounter (Signed)
Refill provided for zolpidem.

## 2020-02-09 NOTE — Addendum Note (Signed)
Addended by: Pleas Koch on: 02/09/2020 08:53 AM   Modules accepted: Orders

## 2020-02-19 ENCOUNTER — Other Ambulatory Visit: Payer: Self-pay | Admitting: Neurology

## 2020-02-23 ENCOUNTER — Ambulatory Visit (INDEPENDENT_AMBULATORY_CARE_PROVIDER_SITE_OTHER): Payer: PPO

## 2020-02-23 DIAGNOSIS — Z0000 Encounter for general adult medical examination without abnormal findings: Secondary | ICD-10-CM

## 2020-02-23 NOTE — Progress Notes (Signed)
PCP notes:  Health Maintenance: No gaps noted    Abnormal Screenings: none   Patient concerns: none   Nurse concerns: none   Next PCP appt: none 

## 2020-02-23 NOTE — Progress Notes (Signed)
Subjective:   Morgan Herrera is a 69 y.o. female who presents for an Initial Medicare Annual Wellness Visit.  Review of Systems: N/A      I connected with the patient today by telephone and verified that I am speaking with the correct person using two identifiers. Location patient: home Location nurse: work Persons participating in the virtual visit: patient, Marine scientist.   I discussed the limitations, risks, security and privacy concerns of performing an evaluation and management service by telephone and the availability of in person appointments. I also discussed with the patient that there may be a patient responsible charge related to this service. The patient expressed understanding and verbally consented to this telephonic visit.    Interactive audio and video telecommunications were attempted between this nurse and patient, however failed, due to patient having technical difficulties OR patient did not have access to video capability.  We continued and completed visit with audio only.      Cardiac Risk Factors include: advanced age (>8men, >48 women)     Objective:    Today's Vitals   There is no height or weight on file to calculate BMI.  Advanced Directives 02/23/2020 01/28/2020 07/24/2019 01/19/2019 04/23/2018 04/23/2018 05/19/2015  Does Patient Have a Medical Advance Directive? No Yes Yes Yes No No No  Type of Advance Directive - - Living will;Healthcare Power of [place];Living will - - -  Does patient want to make changes to medical advance directive? - - - No - Patient declined - - -  Copy of [place] in Chart? - - - No - copy requested - - -  Would patient like information on creating a medical advance directive? No - Patient declined - - - No - Patient declined No - Patient declined -    Current Medications (verified) Outpatient Encounter Medications as of 02/23/2020  Medication Sig  . albuterol (VENTOLIN HFA) 108 (90  Base) MCG/ACT inhaler Inhale 1-2 puffs into the lungs every 6 (six) hours as needed for wheezing or shortness of breath.  . AUBAGIO 14 MG TABS Take 1 tablet by mouth once per day  . calcium carbonate (OS-CAL - DOSED IN MG OF ELEMENTAL CALCIUM) 1250 (500 Ca) MG tablet Take 1 tablet by mouth daily.  . cholecalciferol (VITAMIN D) 1000 units tablet Take 2,000 Units by mouth daily.  . cyanocobalamin 500 MCG tablet Take 500 mcg by mouth daily.  Marland Kitchen escitalopram (LEXAPRO) 10 MG tablet TAKE 1 TABLET (10 MG TOTAL) BY MOUTH DAILY. FOR DEPRESSION.  . furosemide (LASIX) 20 MG tablet TAKE 1 TABLET BY MOUTH EVERY DAY  . gabapentin (NEURONTIN) 100 MG capsule TAKE 1 CAPSULE BY MOUTH THREE TIMES A DAY  . zolpidem (AMBIEN) 10 MG tablet Take 0.5-1 tablets (5-10 mg total) by mouth at bedtime as needed for sleep.   No facility-administered encounter medications on file as of 02/23/2020.    Allergies (verified) Patient has no known allergies.   History: Past Medical History:  Diagnosis Date  . (HFpEF) heart failure with preserved ejection fraction ([place])    a. 03/2018 Echo: EF 55-60%, no rwma. Mod MR. Nl PASP.  Marland Kitchen Chest pain    a. 03/2018 Ex MV: rest only imaging showed ant wall perfusion defect. No stress imaging as pt developed hypotension w/ exercise; b. 05/2018 Cath: Nl cors w/ sluggish flow in teh LAD suggestive of endothelial dysfxn. EF 55-65%.  . Migraine   . Multiple sclerosis ([place])   . Neuromuscular  disorder ([place])   . PMR (polymyalgia rheumatica) (H[facility])   . Pulmonary nodule    a. 03/2018 CTA Chest: 41mm small spiculated nodular density @ the L lung apex. Rec f/u in 3 mos.   Past Surgical History:  Procedure Laterality Date  . BREAST EXCISIONAL BIOPSY    . BREAST SURGERY  1990  . COLONOSCOPY WITH PROPOFOL N/A 05/19/2015   Procedure: COLONOSCOPY WITH PROPOFOL;  Surgeon: Hulen Luster, MD;  Location: [facility] ENDOSCOPY;  Service: Gastroenterology;  Laterality: N/A;  . EYE SURGERY     1978 1st of 3 eye surgeries  .  LEFT HEART CATH AND CORONARY ANGIOGRAPHY N/A 04/25/2018   Procedure: LEFT HEART CATH AND CORONARY ANGIOGRAPHY;  Surgeon: Wellington Hampshire, MD;  Location: [facility] CV [facility];  Service: Cardiovascular;  Laterality: N/A;  . TENDON REPAIR     Family History  Problem Relation Age of Onset  . Heart disease Mother   . Hypertension Mother   . Parkinson's disease Mother   . Parkinsonism Mother   . Cancer Maternal Grandmother        breast  . Breast cancer Maternal Grandmother   . Asthma Father    Social History   Socioeconomic History  . Marital status: Married    Spouse name: Not on file  . Number of children: 1  . Years of education: Not on file  . Highest education level: Bachelor's degree (e.g., BA, AB, BS)  Occupational History  . Not on file  Tobacco Use  . Smoking status: Former Research scientist (life sciences)  . Smokeless tobacco: Never Used  Substance and Sexual Activity  . Alcohol use: Yes    Alcohol/week: 0.0 standard drinks    Comment: 1 to 2 glass of wine at least 4 nights a week  . Drug use: No  . Sexual activity: Not on file  Other Topics Concern  . Not on file  Social History Narrative   Married.   Moved here from [place] after 35 years   Enjoys arts and crafts. Taking a painting class.   Walking with her spouse.    Reading, shopping.   Social Determinants of Health   Financial Resource Strain: Low Risk   . Difficulty of Paying Living Expenses: Not hard at all  Food Insecurity: No Food Insecurity  . Worried About Charity fundraiser in the Last Year: Never true  . Ran Out of Food in the Last Year: Never true  Transportation Needs: No Transportation Needs  . Lack of Transportation (Medical): No  . Lack of Transportation (Non-Medical): No  Physical Activity: Sufficiently Active  . Days of Exercise per Week: 7 days  . Minutes of Exercise per Session: 60 min  Stress: No Stress Concern Present  . Feeling of Stress : Not at all  Social Connections:   . Frequency of  Communication with Friends and Family:   . Frequency of Social Gatherings with Friends and Family:   . Attends Religious Services:   . Active Member of Clubs or Organizations:   . Attends Archivist Meetings:   Marland Kitchen Marital Status:     Tobacco Counseling Counseling given: Not Answered   Clinical Intake:  Pre-visit preparation completed: Yes  Pain : No/denies pain     Nutritional Risks: None Diabetes: No  How often do you need to have someone help you when you read instructions, pamphlets, or other written materials from your doctor or pharmacy?: 1 - Never What is the last grade level you completed  in school?: bachelors  Interpreter Needed?: No  Information entered by :: [place], LPN   Activities of Daily Living In your present state of health, do you have any difficulty performing the following activities: 02/23/2020  Hearing? N  Vision? N  Difficulty concentrating or making decisions? N  Walking or climbing stairs? N  Dressing or bathing? N  Doing errands, shopping? N  Preparing Food and eating ? N  Using the Toilet? N  In the past six months, have you accidently leaked urine? N  Do you have problems with loss of bowel control? N  Managing your Medications? N  Managing your Finances? N  Housekeeping or managing your Housekeeping? N  Some recent data might be hidden     Immunizations and Health Maintenance Immunization History  Administered Date(s) Administered  . Fluad Quad(high Dose 65+) 06/11/2019  . Influenza,inj,Quad PF,6+ Mos 06/26/2018  . PFIZER SARS-COV-2 Vaccination 10/26/2019, 11/16/2019  . PPD Test 06/15/2016  . Pneumococcal Conjugate-13 04/10/2017  . Pneumococcal Polysaccharide-23 05/26/2015  . Td 11/30/2014  . Zoster 10/01/2010  . Zoster Recombinat (Shingrix) 07/28/2019, 01/25/2020   There are no preventive care reminders to display for this patient.  Patient Care Team: Pleas Koch, NP as PCP - General (Nurse  Practitioner) Wellington Hampshire, MD as PCP - Cardiology (Cardiology) Wellington Hampshire, MD as Consulting Physician (Cardiology) Pieter Partridge, DO as Consulting Physician (Neurology)  Indicate any recent Medical Services you may have received from other than [facility] providers in the past year (date may be approximate).     Assessment:   This is a routine wellness examination for Timothy.  Hearing/Vision screen  Hearing Screening   125Hz  250Hz  500Hz  1000Hz  2000Hz  3000Hz  4000Hz  6000Hz  8000Hz   Right ear:           Left ear:           Vision Screening Comments: Patient gets annual eye exams.  Dietary issues and exercise activities discussed: Current Exercise Habits: Structured exercise class, Type of exercise: yoga;walking, Time (Minutes): > 60, Frequency (Times/Week): 7, Weekly Exercise (Minutes/Week): 0, Intensity: Moderate, Exercise limited by: None identified  Goals    . Patient Stated     02/23/2020, I will continue to walk 5 miles everyday and do yoga 3 days a week for 1 hour.       Depression Screen PHQ 2/9 Scores 02/23/2020 05/07/2018 04/10/2017  PHQ - 2 Score 0 0 0  PHQ- 9 Score 0 - -    Fall Risk Fall Risk  02/23/2020 01/28/2020 07/24/2019 01/19/2019 07/18/2018  Falls in the past year? 0 0 0 0 No  Number falls in past yr: 0 0 0 - -  Injury with Fall? 0 0 0 - -  Risk for fall due to : No Fall Risks - - - -  Follow up Falls evaluation completed;Falls prevention discussed - - Falls evaluation completed -    Is the patient's home free of loose throw rugs in walkways, pet beds, electrical cords, etc?   yes      Grab bars in the bathroom? no      Handrails on the stairs?   yes      Adequate lighting?   yes  Timed Get Up and Go Performed: N/A  Cognitive Function: MMSE - Mini Mental State Exam 02/23/2020 12/13/2015  Not completed: Refused -  Orientation to time - 5  Orientation to Place - 4  Registration - 3  Attention/ Calculation - 5  Recall - 3  Language- name 2 objects - 2   Language- repeat - 1  Language- follow 3 step command - 3  Language- read & follow direction - 1  Write a sentence - 1  Copy design - 1  Total score - 29  Mini Cog  Mini-Cog screen was completed. Maximum score is 22. A value of 0 denotes this part of the MMSE was not completed or the patient failed this part of the Mini-Cog screening.       Screening Tests Health Maintenance  Topic Date Due  . INFLUENZA VACCINE  05/01/2020  . PNA vac Low Risk Adult (2 of 2 - PPSV23) 05/25/2020  . MAMMOGRAM  11/10/2021  . TETANUS/TDAP  11/29/2024  . COLONOSCOPY  05/18/2025  . DEXA SCAN  Completed  . COVID-19 Vaccine  Completed  . Hepatitis C Screening  Completed    Qualifies for Shingles Vaccine: Completed series  Cancer Screenings: Lung: Low Dose CT Chest recommended if Age 59-80 years, 30 pack-year currently smoking OR have quit w/in 15 years. Patient does not qualify. Breast: Up to date on Mammogram: Yes, completed 11/11/2019   Bone Density/Dexa: completed 03/15/2015 Colorectal: completed 05/19/2015  Additional Screenings:  Hepatitis C Screening: 03/12/2016     Plan:    Patient will continue to walk 5 miles everyday and do yoga 3 days a week for 1 hour.  I have personally reviewed and noted the following in the patient's chart:   . Medical and social history . Use of alcohol, tobacco or illicit drugs  . Current medications and supplements . Functional ability and status . Nutritional status . Physical activity . Advanced directives . List of other physicians . Hospitalizations, surgeries, and ER visits in previous 12 months . Vitals . Screenings to include cognitive, depression, and falls . Referrals and appointments  In addition, I have reviewed and discussed with patient certain preventive protocols, quality metrics, and best practice recommendations. A written personalized care plan for preventive services as well as general preventive health recommendations were provided  to patient.     Andrez Grime, LPN   [id number]

## 2020-02-23 NOTE — Patient Instructions (Signed)
Morgan Herrera , Thank you for taking time to come for your Medicare Wellness Visit. I appreciate your ongoing commitment to your health goals. Please review the following plan we discussed and let me know if I can assist you in the future.   Screening recommendations/referrals: Colonoscopy: Up to date, completed 05/19/2015 Mammogram: Up to date, completed 11/11/2019 Bone Density: completed 03/15/2015 Recommended yearly ophthalmology/optometry visit for glaucoma screening and checkup Recommended yearly dental visit for hygiene and checkup  Vaccinations: Influenza vaccine: Up to date, completed 06/11/2019 Pneumococcal vaccine: Completed series Tdap vaccine: Up to date, completed 11/30/2014 Shingles vaccine: Completed series     Advanced directives: Advance directive discussed with you today. Even though you declined this today please call our office should you change your mind and we can give you the proper paperwork for you to fill out.   Conditions/risks identified: none  Next appointment: none   Preventive Care 65 Years and Older, Female Preventive care refers to lifestyle choices and visits with your health care provider that can promote health and wellness. What does preventive care include?  A yearly physical exam. This is also called an annual well check.  Dental exams once or twice a year.  Routine eye exams. Ask your health care provider how often you should have your eyes checked.  Personal lifestyle choices, including:  Daily care of your teeth and gums.  Regular physical activity.  Eating a healthy diet.  Avoiding tobacco and drug use.  Limiting alcohol use.  Practicing safe sex.  Taking low-dose aspirin every day.  Taking vitamin and mineral supplements as recommended by your health care provider. What happens during an annual well check? The services and screenings done by your health care provider during your annual well check will depend on your age,  overall health, lifestyle risk factors, and family history of disease. Counseling  Your health care provider may ask you questions about your:  Alcohol use.  Tobacco use.  Drug use.  Emotional well-being.  Home and relationship well-being.  Sexual activity.  Eating habits.  History of falls.  Memory and ability to understand (cognition).  Work and work Statistician.  Reproductive health. Screening  You may have the following tests or measurements:  Height, weight, and BMI.  Blood pressure.  Lipid and cholesterol levels. These may be checked every 5 years, or more frequently if you are over 76 years old.  Skin check.  Lung cancer screening. You may have this screening every year starting at age 17 if you have a 30-pack-year history of smoking and currently smoke or have quit within the past 15 years.  Fecal occult blood test (FOBT) of the stool. You may have this test every year starting at age 77.  Flexible sigmoidoscopy or colonoscopy. You may have a sigmoidoscopy every 5 years or a colonoscopy every 10 years starting at age 21.  Hepatitis C blood test.  Hepatitis B blood test.  Sexually transmitted disease (STD) testing.  Diabetes screening. This is done by checking your blood sugar (glucose) after you have not eaten for a while (fasting). You may have this done every 1-3 years.  Bone density scan. This is done to screen for osteoporosis. You may have this done starting at age 34.  Mammogram. This may be done every 1-2 years. Talk to your health care provider about how often you should have regular mammograms. Talk with your health care provider about your test results, treatment options, and if necessary, the need for more tests. Vaccines  Your health care provider may recommend certain vaccines, such as:  Influenza vaccine. This is recommended every year.  Tetanus, diphtheria, and acellular pertussis (Tdap, Td) vaccine. You may need a Td booster every 10  years.  Zoster vaccine. You may need this after age 65.  Pneumococcal 13-valent conjugate (PCV13) vaccine. One dose is recommended after age 14.  Pneumococcal polysaccharide (PPSV23) vaccine. One dose is recommended after age 45. Talk to your health care provider about which screenings and vaccines you need and how often you need them. This information is not intended to replace advice given to you by your health care provider. Make sure you discuss any questions you have with your health care provider. Document Released: 10/14/2015 Document Revised: 06/06/2016 Document Reviewed: 07/19/2015 Elsevier Interactive Patient Education  2017 [place] Prevention in the Home Falls can cause injuries. They can happen to people of all ages. There are many things you can do to make your home safe and to help prevent falls. What can I do on the outside of my home?  Regularly fix the edges of walkways and driveways and fix any cracks.  Remove anything that might make you trip as you walk through a door, such as a raised step or threshold.  Trim any bushes or trees on the path to your home.  Use bright outdoor lighting.  Clear any walking paths of anything that might make someone trip, such as rocks or tools.  Regularly check to see if handrails are loose or broken. Make sure that both sides of any steps have handrails.  Any raised decks and porches should have guardrails on the edges.  Have any leaves, snow, or ice cleared regularly.  Use sand or salt on walking paths during winter.  Clean up any spills in your garage right away. This includes oil or grease spills. What can I do in the bathroom?  Use night lights.  Install grab bars by the toilet and in the tub and shower. Do not use towel bars as grab bars.  Use non-skid mats or decals in the tub or shower.  If you need to sit down in the shower, use a plastic, non-slip stool.  Keep the floor dry. Clean up any water that  spills on the floor as soon as it happens.  Remove soap buildup in the tub or shower regularly.  Attach bath mats securely with double-sided non-slip rug tape.  Do not have throw rugs and other things on the floor that can make you trip. What can I do in the bedroom?  Use night lights.  Make sure that you have a light by your bed that is easy to reach.  Do not use any sheets or blankets that are too big for your bed. They should not hang down onto the floor.  Have a firm chair that has side arms. You can use this for support while you get dressed.  Do not have throw rugs and other things on the floor that can make you trip. What can I do in the kitchen?  Clean up any spills right away.  Avoid walking on wet floors.  Keep items that you use a lot in easy-to-reach places.  If you need to reach something above you, use a strong step stool that has a grab bar.  Keep electrical cords out of the way.  Do not use floor polish or wax that makes floors slippery. If you must use wax, use non-skid floor wax.  Do  not have throw rugs and other things on the floor that can make you trip. What can I do with my stairs?  Do not leave any items on the stairs.  Make sure that there are handrails on both sides of the stairs and use them. Fix handrails that are broken or loose. Make sure that handrails are as long as the stairways.  Check any carpeting to make sure that it is firmly attached to the stairs. Fix any carpet that is loose or worn.  Avoid having throw rugs at the top or bottom of the stairs. If you do have throw rugs, attach them to the floor with carpet tape.  Make sure that you have a light switch at the top of the stairs and the bottom of the stairs. If you do not have them, ask someone to add them for you. What else can I do to help prevent falls?  Wear shoes that:  Do not have high heels.  Have rubber bottoms.  Are comfortable and fit you well.  Are closed at the  toe. Do not wear sandals.  If you use a stepladder:  Make sure that it is fully opened. Do not climb a closed stepladder.  Make sure that both sides of the stepladder are locked into place.  Ask someone to hold it for you, if possible.  Clearly mark and make sure that you can see:  Any grab bars or handrails.  First and last steps.  Where the edge of each step is.  Use tools that help you move around (mobility aids) if they are needed. These include:  Canes.  Walkers.  Scooters.  Crutches.  Turn on the lights when you go into a dark area. Replace any light bulbs as soon as they burn out.  Set up your furniture so you have a clear path. Avoid moving your furniture around.  If any of your floors are uneven, fix them.  If there are any pets around you, be aware of where they are.  Review your medicines with your doctor. Some medicines can make you feel dizzy. This can increase your chance of falling. Ask your doctor what other things that you can do to help prevent falls. This information is not intended to replace advice given to you by your health care provider. Make sure you discuss any questions you have with your health care provider. Document Released: 07/14/2009 Document Revised: 02/23/2016 Document Reviewed: 10/22/2014 Elsevier Interactive Patient Education  2017 [employer].

## 2020-02-26 ENCOUNTER — Other Ambulatory Visit: Payer: Self-pay | Admitting: Family Medicine

## 2020-02-26 DIAGNOSIS — F330 Major depressive disorder, recurrent, mild: Secondary | ICD-10-CM

## 2020-03-03 ENCOUNTER — Emergency Department
Admission: EM | Admit: 2020-03-03 | Discharge: 2020-03-03 | Disposition: A | Discharge status: Home / Self Care | Payer: PPO | Attending: Student in an Organized Health Care Education/Training Program | Admitting: Student in an Organized Health Care Education/Training Program

## 2020-03-03 ENCOUNTER — Other Ambulatory Visit: Payer: Self-pay

## 2020-03-03 DIAGNOSIS — S51811A Laceration without foreign body of right forearm, initial encounter: Secondary | ICD-10-CM | POA: Diagnosis not present

## 2020-03-03 DIAGNOSIS — Z87891 Personal history of nicotine dependence: Secondary | ICD-10-CM | POA: Diagnosis not present

## 2020-03-03 DIAGNOSIS — Y929 Unspecified place or not applicable: Secondary | ICD-10-CM | POA: Diagnosis not present

## 2020-03-03 DIAGNOSIS — Y9389 Activity, other specified: Secondary | ICD-10-CM | POA: Diagnosis not present

## 2020-03-03 DIAGNOSIS — Y999 Unspecified external cause status: Secondary | ICD-10-CM | POA: Insufficient documentation

## 2020-03-03 DIAGNOSIS — W540XXA Bitten by dog, initial encounter: Secondary | ICD-10-CM | POA: Insufficient documentation

## 2020-03-03 DIAGNOSIS — I5032 Chronic diastolic (congestive) heart failure: Secondary | ICD-10-CM | POA: Insufficient documentation

## 2020-03-03 DIAGNOSIS — S51851A Open bite of right forearm, initial encounter: Secondary | ICD-10-CM | POA: Diagnosis not present

## 2020-03-03 DIAGNOSIS — Z79899 Other long term (current) drug therapy: Secondary | ICD-10-CM | POA: Diagnosis not present

## 2020-03-03 MED ORDER — AMOXICILLIN-POT CLAVULANATE 875-125 MG PO TABS
1.0000 | ORAL_TABLET | Freq: Once | ORAL | Status: AC
  Administered 2020-03-03: 1 via ORAL
  Filled 2020-03-03: qty 1

## 2020-03-03 MED ORDER — AMOXICILLIN-POT CLAVULANATE 875-125 MG PO TABS
1.0000 | ORAL_TABLET | Freq: Two times a day (BID) | ORAL | 0 refills | Status: DC
Start: 2020-03-03 — End: 2020-07-08

## 2020-03-03 NOTE — ED Provider Notes (Signed)
[facility] Emergency Department Provider Note  ____________________________________________  Time seen: Approximately 8:48 PM  I have reviewed the triage vital signs and the nursing notes.   HISTORY  Chief Complaint Animal Bite   HPI Morgan Herrera is a 69 y.o. female who presents to the emergency department for treatment and evaluation  after dog bite.  She states that while she was at the red [employer] there was a Clinical research associate that she was petting.  She states that something startled the dog and he somehow cut her right inner forearm with a tooth.  She states that he had not been aggressive.  Owners stated that the rabies vaccinations were current.  Patient did not get any information from the owners and has no way to contact them.  Past Medical History:  Diagnosis Date  . (HFpEF) heart failure with preserved ejection fraction ([place])    a. 03/2018 Echo: EF 55-60%, no rwma. Mod MR. Nl PASP.  Marland Kitchen Chest pain    a. 03/2018 Ex MV: rest only imaging showed ant wall perfusion defect. No stress imaging as pt developed hypotension w/ exercise; b. 05/2018 Cath: Nl cors w/ sluggish flow in teh LAD suggestive of endothelial dysfxn. EF 55-65%.  . Migraine   . Multiple sclerosis ([place])   . Neuromuscular disorder ([place])   . PMR (polymyalgia rheumatica) (HCC)   . Pulmonary nodule    a. 03/2018 CTA Chest: 40mm small spiculated nodular density @ the L lung apex. Rec f/u in 3 mos.    Patient Active Problem List   Diagnosis Date Noted  . Chronic diastolic heart failure ([place]) 05/07/2018  . Shortness of breath 04/30/2018  . Unstable angina ([place])   . PMR (polymyalgia rheumatica) (HCC) 04/23/2018  . Acute CHF (congestive heart failure) ([place]) 04/23/2018  . Welcome to Medicare preventive visit 04/10/2017  . Constipation 03/19/2016  . Relapsing remitting multiple sclerosis ([place]) 12/13/2015  . Insomnia 09/19/2015  . Preventative health care 03/04/2015  . Vitamin D deficiency  03/04/2015  . Multiple sclerosis ([place]) 02/11/2015  . Depression 02/11/2015    Past Surgical History:  Procedure Laterality Date  . BREAST EXCISIONAL BIOPSY    . BREAST SURGERY  1990  . COLONOSCOPY WITH PROPOFOL N/A 05/19/2015   Procedure: COLONOSCOPY WITH PROPOFOL;  Surgeon: Hulen Luster, MD;  Location: [facility] ENDOSCOPY;  Service: Gastroenterology;  Laterality: N/A;  . EYE SURGERY     1978 1st of 3 eye surgeries  . LEFT HEART CATH AND CORONARY ANGIOGRAPHY N/A 04/25/2018   Procedure: LEFT HEART CATH AND CORONARY ANGIOGRAPHY;  Surgeon: Wellington Hampshire, MD;  Location: [facility];  Service: Cardiovascular;  Laterality: N/A;  . TENDON REPAIR      Prior to Admission medications   Medication Sig Start Date End Date Taking? Authorizing Provider  albuterol (VENTOLIN HFA) 108 (90 Base) MCG/ACT inhaler Inhale 1-2 puffs into the lungs every 6 (six) hours as needed for wheezing or shortness of breath. 05/07/19   Fransico Meadow, PA-C  amoxicillin-clavulanate (AUGMENTIN) 875-125 MG tablet Take 1 tablet by mouth 2 (two) times daily. 03/03/20   Sinia Antosh, Johnette Abraham B, FNP  AUBAGIO 14 MG TABS Take 1 tablet by mouth once per day 02/19/20   Pieter Partridge, DO  calcium carbonate (OS-CAL - DOSED IN MG OF ELEMENTAL CALCIUM) 1250 (500 Ca) MG tablet Take 1 tablet by mouth daily.    [provider]  cholecalciferol (VITAMIN D) 1000 units tablet Take 2,000 Units by mouth daily.  [provider]  cyanocobalamin 500 MCG tablet Take 500 mcg by mouth daily.    [provider]  escitalopram (LEXAPRO) 10 MG tablet TAKE 1 TABLET (10 MG TOTAL) BY MOUTH DAILY. FOR DEPRESSION. 02/08/20   Lesleigh Noe, MD  furosemide (LASIX) 20 MG tablet TAKE 1 TABLET BY MOUTH EVERY DAY 02/04/20   Pleas Koch, NP  gabapentin (NEURONTIN) 100 MG capsule TAKE 1 CAPSULE BY MOUTH THREE TIMES A DAY 02/11/19   Jaffe, Adam R, DO  zolpidem (AMBIEN) 10 MG tablet Take 0.5-1 tablets (5-10 mg total) by mouth at bedtime as  needed for sleep. 02/09/20   Pleas Koch, NP    Allergies Patient has no known allergies.  Family History  Problem Relation Age of Onset  . Heart disease Mother   . Hypertension Mother   . Parkinson's disease Mother   . Parkinsonism Mother   . Cancer Maternal Grandmother        breast  . Breast cancer Maternal Grandmother   . Asthma Father     Social History Social History   Tobacco Use  . Smoking status: Former Research scientist (life sciences)  . Smokeless tobacco: Never Used  Substance Use Topics  . Alcohol use: Yes    Alcohol/week: 0.0 standard drinks    Comment: 1 to 2 glass of wine at least 4 nights a week  . Drug use: No    Review of Systems  Constitutional: Negative for fever. Respiratory: Negative for cough or shortness of breath.  Musculoskeletal: Negative for myalgias Skin: Positive for laceration Neurological: Negative for numbness or paresthesias. ____________________________________________   PHYSICAL EXAM:  VITAL SIGNS: ED Triage Vitals  Enc Vitals Group     BP 03/03/20 2034 (!) 146/75     Pulse Rate 03/03/20 2034 (!) 57     Resp 03/03/20 2034 17     Temp 03/03/20 2034 97.9 F (36.6 C)     Temp Source 03/03/20 2034 Oral     SpO2 03/03/20 2034 97 %     Weight 03/03/20 2035 125 lb (56.7 kg)     Height 03/03/20 2035 5\' 6"  (1.676 m)     Head Circumference --      Peak Flow --      Pain Score 03/03/20 2035 1     Pain Loc --      Pain Edu? --      Excl. in [place]? --      Constitutional: Well appearing. Eyes: Conjunctivae are clear without discharge or drainage. Nose: No rhinorrhea noted. Mouth/Throat: Airway is patent.  Neck: No stridor. Unrestricted range of motion observed. Cardiovascular: Capillary refill is <3 seconds.  Respiratory: Respirations are even and unlabored.. Musculoskeletal: Unrestricted range of motion observed. Neurologic: Awake, alert, and oriented x 4.  Skin: 3.5 cm laceration to the volar aspect of the right forearm with tendon  exposure.  ____________________________________________   LABS (all labs ordered are listed, but only abnormal results are displayed)  Labs Reviewed - No data to display ____________________________________________  EKG  Not indicated. ____________________________________________  RADIOLOGY  Not indicated ____________________________________________   PROCEDURES  .Marland KitchenLaceration Repair  Date/Time: 03/03/2020 10:32 PM Performed by: Victorino Dike, FNP Authorized by: Victorino Dike, FNP   Consent:    Consent obtained:  Verbal   Consent given by:  Patient   Risks discussed:  Infection, poor cosmetic result and poor wound healing Laceration details:    Location:  Shoulder/arm   Shoulder/arm location:  R lower arm   Length (  cm):  3.5 Repair type:    Repair type:  Simple Exploration:    Wound exploration: wound explored through full range of motion     Wound extent: no foreign bodies/material noted and no tendon damage noted   Treatment:    Area cleansed with:  Betadine and saline   Amount of cleaning:  Standard   Irrigation method:  Syringe Skin repair:    Repair method:  Steri-Strips and tissue adhesive   Number of Steri-Strips:  2 Approximation:    Approximation:  Loose Post-procedure details:    Patient tolerance of procedure:  Tolerated well, no immediate complications   ____________________________________________   INITIAL IMPRESSION / ASSESSMENT AND PLAN / ED COURSE  Morgan Herrera is a 69 y.o. female presenting to the emergency department for treatment and evaluation of right forearm laceration.  See HPI for further details.  Wound required partial closure due to exposure of the tendon.  The wound was closed just enough to cover the tendon then Steri-Strips were applied loosely in the other areas.  Patient was given strict instructions to monitor for infection.  First dose of Augmentin was given tonight and she was advised of the importance of taking  the antibiotics as prescribed and until finished.  Her tetanus vaccination is current.  She does not feel that the dog was rabid.  She states that he was friendly and the only reason that he bit her was because he was startled.  He acted normal after the bite.  Based on this, it is highly unlikely that rabies series needed to be initiated.  Animal control was notified by the RN, but they have not returned her call thus far.  They have the patient's name and phone number.  Patient was advised to see primary care or be evaluated in the emergency department for any concerns of infection.  Medications  amoxicillin-clavulanate (AUGMENTIN) 875-125 MG per tablet 1 tablet (1 tablet Oral Given 03/03/20 2113)     Pertinent labs & imaging results that were available during my care of the patient were reviewed by me and considered in my medical decision making (see chart for details).  ____________________________________________   FINAL CLINICAL IMPRESSION(S) / ED DIAGNOSES  Final diagnoses:  Dog bite, initial encounter    ED Discharge Orders         Ordered    amoxicillin-clavulanate (AUGMENTIN) 875-125 MG tablet  2 times daily     03/03/20 2106           Note:  This document was prepared using Dragon voice recognition software and may include unintentional dictation errors.   Victorino Dike, FNP 03/03/20 2238    Merlyn Lot, MD 03/07/20 [phone]

## 2020-03-03 NOTE — ED Notes (Signed)
See triage note, pt POV from [employer] with dog bite to right forearm. Bleeding controlled at this time.  Pt states black lab bit her, states owners told her dog was up to date on shots. PT DID NOT GET OWNER CONTACT INFO

## 2020-03-03 NOTE — ED Notes (Signed)
This RN reported dog bite to [employer], spoke with Salena Saner, was informed would receive a call back regarding whether animal control or a deputy would follow up in ED or after discharge.

## 2020-03-03 NOTE — ED Notes (Signed)
Animal control has pt contact info and will call pt directly instead of sending unit to Ed

## 2020-03-03 NOTE — ED Triage Notes (Addendum)
Pt arrives to ED via POV from [facility] with c/o dog bite to the right forearm that happened immediately PTA. Pt states she startled a dog that bit her, but denies knowing the name of the owner or obtaining any contact information for same, but states the owner "assured" her the dog was UTD on shots. Pt arrives with an approximately 1.5" laceration to the anterior forearm with no bleeding at this time. Pt is A&O, in NAD; RR even, regular, and unlabored.

## 2020-03-03 NOTE — Discharge Instructions (Signed)
Please watch for any sign of infection. If concerned, go to the nearest emergency department.

## 2020-03-04 ENCOUNTER — Encounter: Payer: Self-pay | Admitting: Internal Medicine

## 2020-03-04 ENCOUNTER — Ambulatory Visit (INDEPENDENT_AMBULATORY_CARE_PROVIDER_SITE_OTHER): Payer: PPO | Admitting: Internal Medicine

## 2020-03-04 ENCOUNTER — Telehealth: Payer: Self-pay

## 2020-03-04 VITALS — BP 110/70 | HR 58 | Temp 98.1°F | Ht 66.0 in | Wt 127.0 lb

## 2020-03-04 DIAGNOSIS — T148XXA Other injury of unspecified body region, initial encounter: Secondary | ICD-10-CM | POA: Diagnosis not present

## 2020-03-04 DIAGNOSIS — S51851A Open bite of right forearm, initial encounter: Secondary | ICD-10-CM

## 2020-03-04 HISTORY — DX: Other injury of unspecified body region, initial encounter: T14.8XXA

## 2020-03-04 NOTE — Assessment & Plan Note (Signed)
Of forearm from dog bite Was down to tendon--[place] Discussed that her inflammation and tenderness are from the trauma of the bite---not secondary infection at this point Got 10 days of augmentin--should use at least 7 days

## 2020-03-04 NOTE — Telephone Encounter (Signed)
I spoke with pt's husband; pt was bit by dog last night; pt is leaving for extended two wk driving trip to [place] on 03/05/20.pt was seen [facility] ED on 03/03/20. Pt said is having some pinkish drainage from arm. Pt wants to be seen to make sure arm is OK to travel. Pt has no covid symptoms, no travel and no known exposure to + covid. Per immunization record last Td was 11/30/14. Pt has appt with Dr Silvio Pate this morning at 10:15 AM.

## 2020-03-04 NOTE — Progress Notes (Signed)
Subjective:    Patient ID: Morgan Herrera, female    DOB: 22-Jan-1951, 69 y.o.   MRN: [id number]  HPI Here for follow up after a dog bite This visit occurred during the SARS-CoV-2 public health emergency.  Safety protocols were in place, including screening questions prior to the visit, additional usage of staff PPE, and extensive cleaning of exam room while observing appropriate contact time as indicated for disinfecting solutions.   Was at [facility] ~7PM Someone walked by with a dog She asked and then petted the dog--but got bit on volar right forearm after Cleaned well Decided to go to the ER Got glue at base---due to tendon being visible Rx augmentin and took the first dose  Some seeping this morning  Current Outpatient Medications on File Prior to Visit  Medication Sig Dispense Refill  . albuterol (VENTOLIN HFA) 108 (90 Base) MCG/ACT inhaler Inhale 1-2 puffs into the lungs every 6 (six) hours as needed for wheezing or shortness of breath. 1 g 1  . amoxicillin-clavulanate (AUGMENTIN) 875-125 MG tablet Take 1 tablet by mouth 2 (two) times daily. 20 tablet 0  . AUBAGIO 14 MG TABS Take 1 tablet by mouth once per day 30 tablet 11  . calcium carbonate (OS-CAL - DOSED IN MG OF ELEMENTAL CALCIUM) 1250 (500 Ca) MG tablet Take 1 tablet by mouth daily.    . cholecalciferol (VITAMIN D) 1000 units tablet Take 2,000 Units by mouth daily.    . cyanocobalamin 500 MCG tablet Take 500 mcg by mouth daily.    Marland Kitchen escitalopram (LEXAPRO) 10 MG tablet TAKE 1 TABLET (10 MG TOTAL) BY MOUTH DAILY. FOR DEPRESSION. 30 tablet 0  . furosemide (LASIX) 20 MG tablet TAKE 1 TABLET BY MOUTH EVERY DAY 90 tablet 1  . gabapentin (NEURONTIN) 100 MG capsule TAKE 1 CAPSULE BY MOUTH THREE TIMES A DAY 270 capsule 2  . zolpidem (AMBIEN) 10 MG tablet Take 0.5-1 tablets (5-10 mg total) by mouth at bedtime as needed for sleep. 90 tablet 0   No current facility-administered medications on file prior to visit.    No Known  Allergies  Past Medical History:  Diagnosis Date  . (HFpEF) heart failure with preserved ejection fraction ([place])    a. 03/2018 Echo: EF 55-60%, no rwma. Mod MR. Nl PASP.  Marland Kitchen Chest pain    a. 03/2018 Ex MV: rest only imaging showed ant wall perfusion defect. No stress imaging as pt developed hypotension w/ exercise; b. 05/2018 Cath: Nl cors w/ sluggish flow in teh LAD suggestive of endothelial dysfxn. EF 55-65%.  . Migraine   . Multiple sclerosis ([place])   . Neuromuscular disorder ([place])   . PMR (polymyalgia rheumatica) (HCC)   . Pulmonary nodule    a. 03/2018 CTA Chest: 73mm small spiculated nodular density @ the L lung apex. Rec f/u in 3 mos.    Past Surgical History:  Procedure Laterality Date  . BREAST EXCISIONAL BIOPSY    . BREAST SURGERY  1990  . COLONOSCOPY WITH PROPOFOL N/A 05/19/2015   Procedure: COLONOSCOPY WITH PROPOFOL;  Surgeon: Hulen Luster, MD;  Location: [facility] ENDOSCOPY;  Service: Gastroenterology;  Laterality: N/A;  . EYE SURGERY     1978 1st of 3 eye surgeries  . LEFT HEART CATH AND CORONARY ANGIOGRAPHY N/A 04/25/2018   Procedure: LEFT HEART CATH AND CORONARY ANGIOGRAPHY;  Surgeon: Wellington Hampshire, MD;  Location: [place] CV LAB;  Service: Cardiovascular;  Laterality: N/A;  . TENDON REPAIR  Family History  Problem Relation Age of Onset  . Heart disease Mother   . Hypertension Mother   . Parkinson's disease Mother   . Parkinsonism Mother   . Cancer Maternal Grandmother        breast  . Breast cancer Maternal Grandmother   . Asthma Father     Social History   Socioeconomic History  . Marital status: Married    Spouse name: Not on file  . Number of children: 1  . Years of education: Not on file  . Highest education level: Bachelor's degree (e.g., BA, AB, BS)  Occupational History  . Not on file  Tobacco Use  . Smoking status: Former Research scientist (life sciences)  . Smokeless tobacco: Never Used  Substance and Sexual Activity  . Alcohol use: Yes    Alcohol/week: 0.0 standard  drinks    Comment: 1 to 2 glass of wine at least 4 nights a week  . Drug use: No  . Sexual activity: Not on file  Other Topics Concern  . Not on file  Social History Narrative   Married.   Moved here from [place] after 35 years   Enjoys arts and crafts. Taking a painting class.   Walking with her spouse.    Reading, shopping.   Social Determinants of Health   Financial Resource Strain: Low Risk   . Difficulty of Paying Living Expenses: Not hard at all  Food Insecurity: No Food Insecurity  . Worried About Charity fundraiser in the Last Year: Never true  . Ran Out of Food in the Last Year: Never true  Transportation Needs: No Transportation Needs  . Lack of Transportation (Medical): No  . Lack of Transportation (Non-Medical): No  Physical Activity: Sufficiently Active  . Days of Exercise per Week: 7 days  . Minutes of Exercise per Session: 60 min  Stress: No Stress Concern Present  . Feeling of Stress : Not at all  Social Connections:   . Frequency of Communication with Friends and Family:   . Frequency of Social Gatherings with Friends and Family:   . Attends Religious Services:   . Active Member of Clubs or Organizations:   . Attends Archivist Meetings:   Marland Kitchen Marital Status:   Intimate Partner Violence: Not At Risk  . Fear of Current or Ex-Partner: No  . Emotionally Abused: No  . Physically Abused: No  . Sexually Abused: No   Review of Systems No fever Very tender today Leaving for cross country vacation (driving to [place])    Objective:   Physical Exam  Skin:  ~2.5cm tranverse laceration Wound opposed with steristrips Surrounding swelling/inflammation and tenderness Not overly red           Assessment & Plan:

## 2020-03-04 NOTE — Telephone Encounter (Signed)
[place] Night - Client Nonclinical Telephone Record AccessNurse Client [facility] Night - Client Client Site [place] Physician Alma Friendly - NP Contact Type Call Who Is Calling Patient / Member / Family / Caregiver Caller Name [place] Phone Number [phone] Patient Name Morgan Herrera Patient DOB [birth year] Call Type Message Only Information Provided Reason for Call Request for General Office Information Initial Comment Caller states she would like the office to call her. Additional Comment Disp. Time Disposition Final User 03/04/2020 7:08:45 AM General Information Provided Yes Baruch Goldmann Call Closed By: Baruch Goldmann Transaction Date/Time: 03/04/2020 7:06:12 AM (ET)

## 2020-03-04 NOTE — Telephone Encounter (Signed)
See note

## 2020-03-08 NOTE — Telephone Encounter (Signed)
Last prescribed on 02/08/2020 by Dr Einar Pheasant. Last OV on 03/04/2020 with Dr Silvio Pate. No future OV scheduled

## 2020-03-08 NOTE — Telephone Encounter (Signed)
Morgan Herrera pt.

## 2020-03-09 NOTE — Telephone Encounter (Signed)
Noted, refills sent to pharmacy. 

## 2020-04-07 ENCOUNTER — Ambulatory Visit
Admission: RE | Admit: 2020-04-07 | Discharge: 2020-04-07 | Disposition: A | Discharge status: Home / Self Care | Payer: PPO | Source: Ambulatory Visit | Attending: Neurology | Admitting: Neurology

## 2020-04-07 ENCOUNTER — Other Ambulatory Visit: Payer: Self-pay

## 2020-04-07 DIAGNOSIS — G35 Multiple sclerosis: Secondary | ICD-10-CM | POA: Diagnosis not present

## 2020-04-07 MED ORDER — GADOBENATE DIMEGLUMINE 529 MG/ML IV SOLN
11.0000 mL | Freq: Once | INTRAVENOUS | Status: AC | PRN
  Administered 2020-04-07: 11 mL via INTRAVENOUS

## 2020-04-08 ENCOUNTER — Telehealth: Payer: Self-pay

## 2020-04-08 NOTE — Telephone Encounter (Signed)
Spoke with pt an informed of test results per Dr Tomi Likens -   MRI is stable when compared to 2019

## 2020-05-24 DIAGNOSIS — H3540 Unspecified peripheral retinal degeneration: Secondary | ICD-10-CM | POA: Diagnosis not present

## 2020-05-24 DIAGNOSIS — H59811 Chorioretinal scars after surgery for detachment, right eye: Secondary | ICD-10-CM | POA: Diagnosis not present

## 2020-05-24 DIAGNOSIS — H2512 Age-related nuclear cataract, left eye: Secondary | ICD-10-CM | POA: Diagnosis not present

## 2020-05-24 DIAGNOSIS — Z961 Presence of intraocular lens: Secondary | ICD-10-CM | POA: Diagnosis not present

## 2020-07-08 ENCOUNTER — Encounter: Payer: Self-pay | Admitting: Family Medicine

## 2020-07-08 ENCOUNTER — Ambulatory Visit (INDEPENDENT_AMBULATORY_CARE_PROVIDER_SITE_OTHER): Payer: PPO | Admitting: Family Medicine

## 2020-07-08 ENCOUNTER — Other Ambulatory Visit: Payer: Self-pay

## 2020-07-08 VITALS — BP 132/70 | HR 46 | Temp 96.9°F | Ht 66.0 in | Wt 124.6 lb

## 2020-07-08 DIAGNOSIS — R519 Headache, unspecified: Secondary | ICD-10-CM

## 2020-07-08 DIAGNOSIS — H6123 Impacted cerumen, bilateral: Secondary | ICD-10-CM | POA: Diagnosis not present

## 2020-07-08 DIAGNOSIS — H6120 Impacted cerumen, unspecified ear: Secondary | ICD-10-CM | POA: Insufficient documentation

## 2020-07-08 DIAGNOSIS — H9201 Otalgia, right ear: Secondary | ICD-10-CM | POA: Insufficient documentation

## 2020-07-08 NOTE — Progress Notes (Signed)
Subjective:    Patient ID: Morgan Herrera, female    DOB: [birth year], 69 y.o.   MRN: [id number]  This visit occurred during the SARS-CoV-2 public health emergency.  Safety protocols were in place, including screening questions prior to the visit, additional usage of staff PPE, and extensive cleaning of exam room while observing appropriate contact time as indicated for disinfecting solutions.    HPI 69 yo pt of NP Clark presents for R ear pain   Jabbing pain in /behind R ear -worst on wed (several times per minute) Tender behind ear now  Has had a headache for several days  No rash  Just a little off balance   Some congestion  ? Allergies  Mucous is always clear    No fever Tired  Got her covid booster 2 weeks ago -made her stiff and achey  BP Readings from Last 3 Encounters:  07/08/20 132/70  03/04/20 110/70  03/03/20 (!) 146/75   Pulse Readings from Last 3 Encounters:  07/08/20 (!) 46  03/04/20 (!) 58  03/03/20 (!) 57    Has MS  Unsure if adding to any problems   Patient Active Problem List   Diagnosis Date Noted  . Right ear pain 07/08/2020  . Cerumen impaction 07/08/2020  . Headache 07/08/2020  . Puncture wound 03/04/2020  . Chronic diastolic heart failure ([place]) 05/07/2018  . Shortness of breath 04/30/2018  . Unstable angina ([place])   . PMR (polymyalgia rheumatica) (HCC) 04/23/2018  . Acute CHF (congestive heart failure) ([place]) 04/23/2018  . Welcome to Medicare preventive visit 04/10/2017  . Constipation 03/19/2016  . Relapsing remitting multiple sclerosis ([place]) 12/13/2015  . Insomnia 09/19/2015  . Preventative health care 03/04/2015  . Vitamin D deficiency 03/04/2015  . Multiple sclerosis ([place]) 02/11/2015  . Depression 02/11/2015   Past Medical History:  Diagnosis Date  . (HFpEF) heart failure with preserved ejection fraction ([place])    a. 03/2018 Echo: EF 55-60%, no rwma. Mod MR. Nl PASP.  Marland Kitchen Chest pain    a. 03/2018 Ex MV: rest only imaging showed  ant wall perfusion defect. No stress imaging as pt developed hypotension w/ exercise; b. 05/2018 Cath: Nl cors w/ sluggish flow in teh LAD suggestive of endothelial dysfxn. EF 55-65%.  . Migraine   . Multiple sclerosis ([place])   . Neuromuscular disorder ([place])   . PMR (polymyalgia rheumatica) (HCC)   . Pulmonary nodule    a. 03/2018 CTA Chest: 24mm small spiculated nodular density @ the L lung apex. Rec f/u in 3 mos.   Past Surgical History:  Procedure Laterality Date  . BREAST EXCISIONAL BIOPSY    . BREAST SURGERY  1990  . COLONOSCOPY WITH PROPOFOL N/A 05/19/2015   Procedure: COLONOSCOPY WITH PROPOFOL;  Surgeon: Hulen Luster, MD;  Location: [facility] ENDOSCOPY;  Service: Gastroenterology;  Laterality: N/A;  . EYE SURGERY     1978 1st of 3 eye surgeries  . LEFT HEART CATH AND CORONARY ANGIOGRAPHY N/A 04/25/2018   Procedure: LEFT HEART CATH AND CORONARY ANGIOGRAPHY;  Surgeon: Wellington Hampshire, MD;  Location: [place] CV LAB;  Service: Cardiovascular;  Laterality: N/A;  . TENDON REPAIR     Social History   Tobacco Use  . Smoking status: Former Research scientist (life sciences)  . Smokeless tobacco: Never Used  Vaping Use  . Vaping Use: Never used  Substance Use Topics  . Alcohol use: Yes    Alcohol/week: 0.0 standard drinks    Comment: 1 to 2 glass of wine  at least 4 nights a week  . Drug use: No   Family History  Problem Relation Age of Onset  . Heart disease Mother   . Hypertension Mother   . Parkinson's disease Mother   . Parkinsonism Mother   . Cancer Maternal Grandmother        breast  . Breast cancer Maternal Grandmother   . Asthma Father    No Known Allergies Current Outpatient Medications on File Prior to Visit  Medication Sig Dispense Refill  . albuterol (VENTOLIN HFA) 108 (90 Base) MCG/ACT inhaler Inhale 1-2 puffs into the lungs every 6 (six) hours as needed for wheezing or shortness of breath. 1 g 1  . AUBAGIO 14 MG TABS Take 1 tablet by mouth once per day 30 tablet 11  . calcium carbonate  (OS-CAL - DOSED IN MG OF ELEMENTAL CALCIUM) 1250 (500 Ca) MG tablet Take 1 tablet by mouth daily.    . cholecalciferol (VITAMIN D) 1000 units tablet Take 2,000 Units by mouth daily.    . cyanocobalamin 500 MCG tablet Take 500 mcg by mouth daily.    Marland Kitchen escitalopram (LEXAPRO) 10 MG tablet TAKE 1 TABLET (10 MG TOTAL) BY MOUTH DAILY. FOR DEPRESSION 90 tablet 2  . furosemide (LASIX) 20 MG tablet TAKE 1 TABLET BY MOUTH EVERY DAY 90 tablet 1  . gabapentin (NEURONTIN) 100 MG capsule TAKE 1 CAPSULE BY MOUTH THREE TIMES A DAY 270 capsule 2  . zolpidem (AMBIEN) 10 MG tablet Take 0.5-1 tablets (5-10 mg total) by mouth at bedtime as needed for sleep. 90 tablet 0   No current facility-administered medications on file prior to visit.    Review of Systems     Objective:   Physical Exam Constitutional:      General: She is not in acute distress.    Appearance: Normal appearance. She is normal weight. She is not ill-appearing.  HENT:     Head: Normocephalic and atraumatic.     Comments: No sinus or temple tenderness    Right Ear: There is impacted cerumen.     Left Ear: Tympanic membrane and ear canal normal. There is impacted cerumen.     Ears:     Comments: Cerumen bilat-much more on the right     Nose: Nose normal.     Comments: Boggy nares    Mouth/Throat:     Mouth: Mucous membranes are moist.     Pharynx: Oropharynx is clear.  Eyes:     General:        Left eye: No discharge.     Conjunctiva/sclera: Conjunctivae normal.     Pupils: Pupils are equal, round, and reactive to light.  Neck:     Vascular: No carotid bruit.     Comments: Mild tenderness over R occiput /mastoid area  No rash  Cardiovascular:     Rate and Rhythm: Regular rhythm. Bradycardia present.     Heart sounds: Normal heart sounds.  Pulmonary:     Effort: Pulmonary effort is normal. No respiratory distress.     Breath sounds: No wheezing or rales.  Musculoskeletal:     Cervical back: Normal range of motion and neck  supple. Tenderness present. No rigidity.  Lymphadenopathy:     Cervical: No cervical adenopathy.  Neurological:     Mental Status: She is alert.           Assessment & Plan:   Problem List Items Addressed This Visit      Nervous and Auditory  Cerumen impaction    Able to remove cerumen from the Left ear but it did not help on the right  Advised to start using debrox daily and follow up for another irrigation         Other   Right ear pain - Primary    Discussed possible etiologies including cerument impaction, ETD and occipital headache She will return to repeat ear irrigation after using debox at home also use flonase daily  Then update if no improvement (or if worse at any time)      Headache    Pt has h/o frequent headaches with MS Some shooting pain -  R occipital around ear  Also ear symptoms  Differential includes ETD, migraine, occipital neuralgia, less likely temporal arteritis

## 2020-07-08 NOTE — Assessment & Plan Note (Signed)
Able to remove cerumen from the Left ear but it did not help on the right  Advised to start using debrox daily and follow up for another irrigation

## 2020-07-08 NOTE — Assessment & Plan Note (Addendum)
Pt has h/o frequent headaches with MS Some shooting pain -  R occipital around ear  Also ear symptoms  Differential includes ETD, migraine, occipital neuralgia, less likely temporal arteritis

## 2020-07-08 NOTE — Patient Instructions (Addendum)
Start using debrox every day in the right ear to loosen the cerumen (wax)  Then follow up in about a week to try flushing it again   Try ice on the areas that give you trouble (behind ear, back of head)   Start back on flonase to open sinuses/nose and ear tubes to see if this helps as well   advil or aleve (with food) may help pain and inflammation Tylenol is ok for pain

## 2020-07-10 NOTE — Assessment & Plan Note (Signed)
Discussed possible etiologies including cerument impaction, ETD and occipital headache She will return to repeat ear irrigation after using debox at home also use flonase daily  Then update if no improvement (or if worse at any time)

## 2020-07-13 ENCOUNTER — Encounter: Payer: Self-pay | Admitting: Primary Care

## 2020-07-13 ENCOUNTER — Other Ambulatory Visit: Payer: Self-pay

## 2020-07-13 ENCOUNTER — Ambulatory Visit (INDEPENDENT_AMBULATORY_CARE_PROVIDER_SITE_OTHER): Payer: PPO | Admitting: Primary Care

## 2020-07-13 DIAGNOSIS — H6123 Impacted cerumen, bilateral: Secondary | ICD-10-CM

## 2020-07-13 DIAGNOSIS — Z09 Encounter for follow-up examination after completed treatment for conditions other than malignant neoplasm: Secondary | ICD-10-CM | POA: Diagnosis not present

## 2020-07-13 NOTE — Progress Notes (Signed)
   Subjective:    Patient ID: Morgan Herrera, female    DOB: [birth year], 69 y.o.   MRN: [id number]  HPI   This visit occurred during the SARS-CoV-2 public health emergency.  Safety protocols were in place, including screening questions prior to the visit, additional usage of staff PPE, and extensive cleaning of exam room while observing appropriate contact time as indicated for disinfecting solutions.   Morgan Herrera is a 69 year old female with a history of CHF, Multiple sclerosis, depression & insomnia who presents today to follow up with cerumen impaction.  She was last evaluated on 10/8 with a chief complaint of right ear pain and was found to have cerumen impaction in both left and right. Left ear was successfully irrigated but unable to evacuate right. She has been using debrox daily and she ordered an ear irrigation kit at home. Over the weekend she was able to irrigate her right ear and a large piece of cerumen was removed. She reports shooting pains in her right ear have resolved. She has a mild ache behind her right ear. Denies any dizziness or lightheadedness.    Review of Systems  Constitutional: Negative.   HENT: Positive for ear pain. Negative for hearing loss, sinus pain and tinnitus.   Eyes: Negative.   Respiratory: Negative.  Negative for chest tightness.   Cardiovascular: Negative.   Skin: Negative.   Allergic/Immunologic: Negative.   Neurological: Negative.  Negative for dizziness and light-headedness.       Objective:   Physical Exam Constitutional:      Appearance: Normal appearance.  HENT:     Head: Normocephalic.     Right Ear: Tympanic membrane normal. There is no impacted cerumen.     Left Ear: Tympanic membrane normal. There is no impacted cerumen.     Nose: Nose normal.  Pulmonary:     Effort: Pulmonary effort is normal.  Musculoskeletal:     Cervical back: Normal range of motion and neck supple.  Skin:    Capillary Refill: Capillary refill takes  less than 2 seconds.  Neurological:     General: No focal deficit present.     Mental Status: She is alert and oriented to person, place, and time.           Assessment & Plan:

## 2020-07-13 NOTE — Assessment & Plan Note (Addendum)
Resolution of bilateral cerumen impaction no interventions done today. Able to visualize both tympanic membranes, no signs of infection.   Patient verbalizes understanding to discontinue use of Q-tips.     Agree with assessment and plan. Pleas Koch, NP

## 2020-07-13 NOTE — Progress Notes (Signed)
Subjective:    Patient ID: Morgan Herrera, female    DOB: 1951-07-18, 69 y.o.   MRN: [id number]  HPI  This visit occurred during the SARS-CoV-2 public health emergency.  Safety protocols were in place, including screening questions prior to the visit, additional usage of staff PPE, and extensive cleaning of exam room while observing appropriate contact time as indicated for disinfecting solutions.   Morgan Herrera is a 69 year old female with a medical history of multiple sclerosis, congestive heart failure, PMR who presents today for follow-up of cerumen impaction.  She was initially evaluated 1 week ago for reports of right ear pain.  Exam revealed bilateral cerumen impaction.  Irrigation completed with left ear successfully evacuated, however right ear impaction remained despite intervention.  Since her last visit she has been using Debrox drops to the right ear.  She also purchased a home irrigation kit and was able to successfully evacuate a large amount of cerumen from her right canal.  She has noticed pain behind the right ear is intermittent at times, improving today.  She was previously using Q-tips, has since discarded them at home.  Review of Systems  Constitutional: Negative for fever.  HENT: Negative for congestion and sore throat.   Respiratory: Negative for cough.        Past Medical History:  Diagnosis Date  . (HFpEF) heart failure with preserved ejection fraction ([place])    a. 03/2018 Echo: EF 55-60%, no rwma. Mod MR. Nl PASP.  Marland Kitchen Chest pain    a. 03/2018 Ex MV: rest only imaging showed ant wall perfusion defect. No stress imaging as pt developed hypotension w/ exercise; b. 05/2018 Cath: Nl cors w/ sluggish flow in teh LAD suggestive of endothelial dysfxn. EF 55-65%.  . Migraine   . Multiple sclerosis ([place])   . Neuromuscular disorder ([place])   . PMR (polymyalgia rheumatica) (HCC)   . Pulmonary nodule    a. 03/2018 CTA Chest: 44m small spiculated nodular density @ the L  lung apex. Rec f/u in 3 mos.     Social History   Socioeconomic History  . Marital status: Married    Spouse name: Not on file  . Number of children: 1  . Years of education: Not on file  . Highest education level: Bachelor's degree (e.g., BA, AB, BS)  Occupational History  . Not on file  Tobacco Use  . Smoking status: Former SResearch scientist (life sciences) . Smokeless tobacco: Never Used  Vaping Use  . Vaping Use: Never used  Substance and Sexual Activity  . Alcohol use: Yes    Alcohol/week: 0.0 standard drinks    Comment: 1 to 2 glass of wine at least 4 nights a week  . Drug use: No  . Sexual activity: Not on file  Other Topics Concern  . Not on file  Social History Narrative   Married.   [place]   Enjoys arts and crafts. Taking a painting class.   Walking with her spouse.    Reading, shopping.   Social Determinants of Health   Financial Resource Strain: Low Risk   . Difficulty of Paying Living Expenses: Not hard at all  Food Insecurity: No Food Insecurity  . Worried About RCharity fundraiserin the Last Year: Never true  . Ran Out of Food in the Last Year: Never true  Transportation Needs: No Transportation Needs  . Lack of Transportation (Medical): No  . Lack of Transportation (Non-Medical): No  Physical Activity: Sufficiently Active  . Days of Exercise per Week: 7 days  . Minutes of Exercise per Session: 60 min  Stress: No Stress Concern Present  . Feeling of Stress : Not at all  Social Connections:   . Frequency of Communication with Friends and Family: Not on file  . Frequency of Social Gatherings with Friends and Family: Not on file  . Attends Religious Services: Not on file  . Active Member of Clubs or Organizations: Not on file  . Attends Archivist Meetings: Not on file  . Marital Status: Not on file  Intimate Partner Violence: Not At Risk  . Fear of Current or Ex-Partner: No  . Emotionally Abused: No  . Physically Abused: No  .  Sexually Abused: No    Past Surgical History:  Procedure Laterality Date  . BREAST EXCISIONAL BIOPSY    . BREAST SURGERY  1990  . COLONOSCOPY WITH PROPOFOL N/A 05/19/2015   Procedure: COLONOSCOPY WITH PROPOFOL;  Surgeon: Hulen Luster, MD;  Location: [facility] ENDOSCOPY;  Service: Gastroenterology;  Laterality: N/A;  . EYE SURGERY     1978 1st of 3 eye surgeries  . LEFT HEART CATH AND CORONARY ANGIOGRAPHY N/A 04/25/2018   Procedure: LEFT HEART CATH AND CORONARY ANGIOGRAPHY;  Surgeon: Wellington Hampshire, MD;  Location: [facility] CV LAB;  Service: Cardiovascular;  Laterality: N/A;  . TENDON REPAIR      Family History  Problem Relation Age of Onset  . Heart disease Mother   . Hypertension Mother   . Parkinson's disease Mother   . Parkinsonism Mother   . Cancer Maternal Grandmother        breast  . Breast cancer Maternal Grandmother   . Asthma Father     No Known Allergies  Current Outpatient Medications on File Prior to Visit  Medication Sig Dispense Refill  . albuterol (VENTOLIN HFA) 108 (90 Base) MCG/ACT inhaler Inhale 1-2 puffs into the lungs every 6 (six) hours as needed for wheezing or shortness of breath. 1 g 1  . AUBAGIO 14 MG TABS Take 1 tablet by mouth once per day 30 tablet 11  . calcium carbonate (OS-CAL - DOSED IN MG OF ELEMENTAL CALCIUM) 1250 (500 Ca) MG tablet Take 1 tablet by mouth daily.    . cholecalciferol (VITAMIN D) 1000 units tablet Take 2,000 Units by mouth daily.    . cyanocobalamin 500 MCG tablet Take 500 mcg by mouth daily.    Marland Kitchen escitalopram (LEXAPRO) 10 MG tablet TAKE 1 TABLET (10 MG TOTAL) BY MOUTH DAILY. FOR DEPRESSION 90 tablet 2  . furosemide (LASIX) 20 MG tablet TAKE 1 TABLET BY MOUTH EVERY DAY 90 tablet 1  . gabapentin (NEURONTIN) 100 MG capsule TAKE 1 CAPSULE BY MOUTH THREE TIMES A DAY 270 capsule 2  . zolpidem (AMBIEN) 10 MG tablet Take 0.5-1 tablets (5-10 mg total) by mouth at bedtime as needed for sleep. 90 tablet 0   No current facility-administered  medications on file prior to visit.    BP 132/84   Pulse (!) 51   Temp 97.6 F (36.4 C)   Ht 5' 6"  (1.676 m)   Wt 124 lb (56.2 kg)   SpO2 98%   BMI 20.01 kg/m    Objective:   Physical Exam HENT:     Right Ear: Tympanic membrane, ear canal and external ear normal.     Left Ear: Tympanic membrane, ear canal and external ear normal.     Ears:  Comments: No remaining cerumen to either canal. Pulmonary:     Effort: Pulmonary effort is normal.  Neurological:     Mental Status: She is alert.            Assessment & Plan:

## 2020-07-13 NOTE — Patient Instructions (Signed)
Do not put any objects, including cotton swabs, into your ear. You can clean the opening of your ear canal with a washcloth or facial tissue.  It was a pleasure to see you today!  Earwax Buildup, Adult The ears produce a substance called earwax that helps keep bacteria out of the ear and protects the skin in the ear canal. Occasionally, earwax can build up in the ear and cause discomfort or hearing loss. What increases the risk? This condition is more likely to develop in people who:  Are female.  Are elderly.  Naturally produce more earwax.  Clean their ears often with cotton swabs.  Use earplugs often.  Use in-ear headphones often.  Wear hearing aids.  Have narrow ear canals.  Have earwax that is overly thick or sticky.  Have eczema.  Are dehydrated.  Have excess hair in the ear canal. What are the signs or symptoms? Symptoms of this condition include:  Reduced or muffled hearing.  A feeling of fullness in the ear or feeling that the ear is plugged.  Fluid coming from the ear.  Ear pain.  Ear itch.  Ringing in the ear.  Coughing.  An obvious piece of earwax that can be seen inside the ear canal. How is this diagnosed? This condition may be diagnosed based on:  Your symptoms.  Your medical history.  An ear exam. During the exam, your health care provider will look into your ear with an instrument called an otoscope. You may have tests, including a hearing test. How is this treated? This condition may be treated by:  Using ear drops to soften the earwax.  Having the earwax removed by a health care provider. The health care provider may: ? Flush the ear with water. ? Use an instrument that has a loop on the end (curette). ? Use a suction device.  Surgery to remove the wax buildup. This may be done in severe cases. Follow these instructions at home:   Take over-the-counter and prescription medicines only as told by your health care provider.  Do  not put any objects, including cotton swabs, into your ear. You can clean the opening of your ear canal with a washcloth or facial tissue.  Follow instructions from your health care provider about cleaning your ears. Do not over-clean your ears.  Drink enough fluid to keep your urine clear or pale yellow. This will help to thin the earwax.  Keep all follow-up visits as told by your health care provider. If earwax builds up in your ears often or if you use hearing aids, consider seeing your health care provider for routine, preventive ear cleanings. Ask your health care provider how often you should schedule your cleanings.  If you have hearing aids, clean them according to instructions from the manufacturer and your health care provider. Contact a health care provider if:  You have ear pain.  You develop a fever.  You have blood, pus, or other fluid coming from your ear.  You have hearing loss.  You have ringing in your ears that does not go away.  Your symptoms do not improve with treatment.  You feel like the room is spinning (vertigo). Summary  Earwax can build up in the ear and cause discomfort or hearing loss.  The most common symptoms of this condition include reduced or muffled hearing and a feeling of fullness in the ear or feeling that the ear is plugged.  This condition may be diagnosed based on your symptoms, your  medical history, and an ear exam.  This condition may be treated by using ear drops to soften the earwax or by having the earwax removed by a health care provider.  Do not put any objects, including cotton swabs, into your ear. You can clean the opening of your ear canal with a washcloth or facial tissue. This information is not intended to replace advice given to you by your health care provider. Make sure you discuss any questions you have with your health care provider. Document Revised: 08/30/2017 Document Reviewed: 11/28/2016 Elsevier Patient Education   2020 [employer].

## 2020-07-21 ENCOUNTER — Other Ambulatory Visit (INDEPENDENT_AMBULATORY_CARE_PROVIDER_SITE_OTHER): Payer: PPO

## 2020-07-21 ENCOUNTER — Other Ambulatory Visit: Payer: Self-pay

## 2020-07-21 DIAGNOSIS — G35 Multiple sclerosis: Secondary | ICD-10-CM | POA: Diagnosis not present

## 2020-07-21 LAB — CBC WITH DIFFERENTIAL/PLATELET
Basophils Absolute: 0.1 10*3/uL (ref 0.0–0.1)
Basophils Relative: 1.8 % (ref 0.0–3.0)
Eosinophils Absolute: 0.1 10*3/uL (ref 0.0–0.7)
Eosinophils Relative: 2.4 % (ref 0.0–5.0)
HCT: 40.3 % (ref 36.0–46.0)
Hemoglobin: 13 g/dL (ref 12.0–15.0)
Lymphocytes Relative: 17.6 % (ref 12.0–46.0)
Lymphs Abs: 1 10*3/uL (ref 0.7–4.0)
MCHC: 32.2 g/dL (ref 30.0–36.0)
MCV: 86.8 fl (ref 78.0–100.0)
Monocytes Absolute: 0.7 10*3/uL (ref 0.1–1.0)
Monocytes Relative: 11.9 % (ref 3.0–12.0)
Neutro Abs: 4 10*3/uL (ref 1.4–7.7)
Neutrophils Relative %: 66.3 % (ref 43.0–77.0)
Platelets: 238 10*3/uL (ref 150.0–400.0)
RBC: 4.65 Mil/uL (ref 3.87–5.11)
RDW: 14.3 % (ref 11.5–15.5)
WBC: 6 10*3/uL (ref 4.0–10.5)

## 2020-07-21 LAB — COMPREHENSIVE METABOLIC PANEL
ALT: 21 U/L (ref 0–35)
AST: 23 U/L (ref 0–37)
Albumin: 4.5 g/dL (ref 3.5–5.2)
Alkaline Phosphatase: 44 U/L (ref 39–117)
BUN: 29 mg/dL — ABNORMAL HIGH (ref 6–23)
CO2: 31 mEq/L (ref 19–32)
Calcium: 9.5 mg/dL (ref 8.4–10.5)
Chloride: 104 mEq/L (ref 96–112)
Creatinine, Ser: 0.78 mg/dL (ref 0.40–1.20)
GFR: 77.81 mL/min (ref >60.00)
Glucose, Bld: 99 mg/dL (ref 70–99)
Potassium: 4.3 mEq/L (ref 3.5–5.1)
Sodium: 139 mEq/L (ref 135–145)
Total Bilirubin: 0.5 mg/dL (ref 0.2–1.2)
Total Protein: 6.7 g/dL (ref 6.0–8.3)

## 2020-07-21 LAB — VITAMIN D 25 HYDROXY (VIT D DEFICIENCY, FRACTURES): VITD: 48.45 ng/mL (ref 30.00–100.00)

## 2020-07-24 ENCOUNTER — Other Ambulatory Visit: Payer: Self-pay | Admitting: Primary Care

## 2020-07-24 DIAGNOSIS — R609 Edema, unspecified: Secondary | ICD-10-CM

## 2020-07-25 ENCOUNTER — Other Ambulatory Visit: Payer: PPO

## 2020-08-01 ENCOUNTER — Ambulatory Visit: Payer: PPO | Admitting: Neurology

## 2020-08-01 NOTE — Progress Notes (Signed)
NEUROLOGY FOLLOW UP OFFICE NOTE  Morgan Herrera [id number]  HISTORY OF PRESENT ILLNESS: Morgan Herrera is a 69 year old right-handed woman with multiple sclerosis, polymyalgia rheumatica and migraine who follows up for multiple sclerosis.  UPDATE: Current medications: Aubagio, B12 1034mcg daily, gabapentin 100mg  TID PRN; Lexapro 10mg  daily; Ambien; D3 2000 IU daily.   04/07/2020 MRI BRAIN W WO:  Stable mild white matter disease when compared with 2019. 04/07/2020 MRI CERVICAL SPINE W WO:  1.  Normal MRI of the cord.  2.  Cervical spine degeneration with foraminal narrowings as described.  Diffusely patent spinal canal.  07/21/2020 LABS:  CBC with WBC 6, HGB 13, HCT 40.3, PLT 238, ALC 1; CMP with Na 139, K 4.3, Cl 104, Co2 31, glucose 99, Ca 9.5, BUN 29, Cr 0.78, t bili 0.5, ALP 44, AST 23, ALT 21; vit D 48.45  Vision:Chronic right sided peripheral vision loss Motor: No concerns Sensory: none Pain: Neck and shoulder pain/aches Gait: none Bowel/Bladder: No concerns Fatigue: She feels more tired than usual but it is not affecting her quality of life. She sleeps well. Cognition: No concerns Mood: Some stress.   She has history of headaches which have been infrequent but have increased over past 6 weeks.  They are mild to moderate occipital/biptemporal pressure headaches occurring 3 to 4 days and resolves for a couple of days.  Treats with Aleve or Tylenol (only takes one day a week).  At one time, stabbing pain in back of right ear which resolved.  She does have increased stress.  Drinks 2 cups of coffee every morning.  She drinks several glasses of water a day.  Eats dinner but grazes during the day.  She sleeps well.  She participates in yoga and walking.  Still getting hot flashes.     HISTORY: She was diagnosed with MS at age 50, when she presented with bilateral optic neuritis, right worse than left. She was treated with IV steroids at that time.. In hindsight,  she recalls brief episodes from her past. In her late-20s, she had a 3 week episode of dizziness and ataxia. She had brief episodes of limb paresthesias lasting a couple of days.  Work up included MRIs. MRIs of the brain have shown lesions suspicious for MS. Prior cervical MRI reportedly normal.  She never had a relapse and therefore never required further IV steroids. However, she was diagnosed with polymyalgia rheumatica several years ago, after experiencing severe joint pain and stiffness.   She occasionally has fatigue once in a while, sometimes lasting for 2 weeks. She does not exhibit Lhermitte's sign. She reports burning and aching on the bottom of her feet when she walks for prolonged period.  She works part-time as a Radiation protection practitioner. For the past couple of years, she reports word-finding difficulty. Sometimes it is difficult to concentrate. It has almost affected her work at times. She has a BA in [place]. She underwent neuropsychological testing in July 2017, which revealed anxiety and depression but no cognitive impairment.  Prior disease modifying drugs: Avonex (tired of injections), Tecfidera (nausea, flushing and unable to take aspirin)  09/06/15:MRI of brain with and without contrastshowedchronic non-enhancing T2/FLAIR hyperintensities in the periventricular white matter, some demonstrating Dawson's fingers, with mild corpus callosum involvement, which appears stable compared to 2012 and similar to imaging from 2005. 10/09/16:MRI of brain with and without contrast was stable. 12/28/17:MRI of brain with and without contrast was personally reviewed andis stable compared to prior imaging from 2016.  PAST MEDICAL HISTORY: Past Medical History:  Diagnosis Date  . (HFpEF) heart failure with preserved ejection fraction ([place])    a. 03/2018 Echo: EF 55-60%, no rwma. Mod MR. Nl PASP.  Marland Kitchen Chest pain    a. 03/2018 Ex MV: rest only imaging showed ant wall perfusion  defect. No stress imaging as pt developed hypotension w/ exercise; b. 05/2018 Cath: Nl cors w/ sluggish flow in teh LAD suggestive of endothelial dysfxn. EF 55-65%.  . Migraine   . Multiple sclerosis ([place])   . Neuromuscular disorder ([place])   . PMR (polymyalgia rheumatica) (HCC)   . Pulmonary nodule    a. 03/2018 CTA Chest: 75mm small spiculated nodular density @ the L lung apex. Rec f/u in 3 mos.    MEDICATIONS: Current Outpatient Medications on File Prior to Visit  Medication Sig Dispense Refill  . albuterol (VENTOLIN HFA) 108 (90 Base) MCG/ACT inhaler Inhale 1-2 puffs into the lungs every 6 (six) hours as needed for wheezing or shortness of breath. 1 g 1  . AUBAGIO 14 MG TABS Take 1 tablet by mouth once per day 30 tablet 11  . calcium carbonate (OS-CAL - DOSED IN MG OF ELEMENTAL CALCIUM) 1250 (500 Ca) MG tablet Take 1 tablet by mouth daily.    . cholecalciferol (VITAMIN D) 1000 units tablet Take 2,000 Units by mouth daily.    . cyanocobalamin 500 MCG tablet Take 500 mcg by mouth daily.    Marland Kitchen escitalopram (LEXAPRO) 10 MG tablet TAKE 1 TABLET (10 MG TOTAL) BY MOUTH DAILY. FOR DEPRESSION 90 tablet 2  . furosemide (LASIX) 20 MG tablet TAKE 1 TABLET BY MOUTH EVERY DAY 90 tablet 1  . gabapentin (NEURONTIN) 100 MG capsule TAKE 1 CAPSULE BY MOUTH THREE TIMES A DAY 270 capsule 2  . zolpidem (AMBIEN) 10 MG tablet Take 0.5-1 tablets (5-10 mg total) by mouth at bedtime as needed for sleep. 90 tablet 0   No current facility-administered medications on file prior to visit.    ALLERGIES: No Known Allergies  FAMILY HISTORY: Family History  Problem Relation Age of Onset  . Heart disease Mother   . Hypertension Mother   . Parkinson's disease Mother   . Parkinsonism Mother   . Cancer Maternal Grandmother        breast  . Breast cancer Maternal Grandmother   . Asthma Father    SOCIAL HISTORY: Social History   Socioeconomic History  . Marital status: Married    Spouse name: Not on file  .  Number of children: 1  . Years of education: Not on file  . Highest education level: Bachelor's degree (e.g., BA, AB, BS)  Occupational History  . Not on file  Tobacco Use  . Smoking status: Former Research scientist (life sciences)  . Smokeless tobacco: Never Used  Vaping Use  . Vaping Use: Never used  Substance and Sexual Activity  . Alcohol use: Yes    Alcohol/week: 0.0 standard drinks    Comment: 1 to 2 glass of wine at least 4 nights a week  . Drug use: No  . Sexual activity: Not on file  Other Topics Concern  . Not on file  Social History Narrative   Married.   Moved here from [place] after 35 years   Enjoys arts and crafts. Taking a painting class.   Walking with her spouse.    Reading, shopping.   Social Determinants of Health   Financial Resource Strain: Low Risk   . Difficulty of Paying Living Expenses: Not  hard at all  Food Insecurity: No Food Insecurity  . Worried About Charity fundraiser in the Last Year: Never true  . Ran Out of Food in the Last Year: Never true  Transportation Needs: No Transportation Needs  . Lack of Transportation (Medical): No  . Lack of Transportation (Non-Medical): No  Physical Activity: Sufficiently Active  . Days of Exercise per Week: 7 days  . Minutes of Exercise per Session: 60 min  Stress: No Stress Concern Present  . Feeling of Stress : Not at all  Social Connections:   . Frequency of Communication with Friends and Family: Not on file  . Frequency of Social Gatherings with Friends and Family: Not on file  . Attends Religious Services: Not on file  . Active Member of Clubs or Organizations: Not on file  . Attends Archivist Meetings: Not on file  . Marital Status: Not on file  Intimate Partner Violence: Not At Risk  . Fear of Current or Ex-Partner: No  . Emotionally Abused: No  . Physically Abused: No  . Sexually Abused: No    PHYSICAL EXAM: Blood pressure 135/70, pulse 71, resp. rate 18, height 5\' 6"  (1.676 m), weight 126 lb (57.2  kg), SpO2 98 %. General: No acute distress.  Patient appears well-groomed.   Head:  Normocephalic/atraumatic Neurological Exam: alert and oriented to person, place, and time. Attention span and concentration intact, recent and remote memory intact, fund of knowledge intact.  Speech fluent and not dysarthric, language intact.  CN II-XII intact. Bulk and tone normal, muscle strength 5/5 throughout.  Sensation to light touch, temperature and vibration intact.  Deep tendon reflexes 2+ throughout, toes downgoing.  Finger to nose and heel to shin testing intact.  Gait normal, Romberg negative.  IMPRESSION: Multiple sclerosis Headaches, tension-type, aggravated by stress.  Defers medication.  PLAN: 1.  Aubagio 2.  D3 3.  Repeat CBC with diff, CMP and vit D in 6 months 4.  Follow up in 6 months.  Metta Clines, DO  CC: Alma Friendly, NP

## 2020-08-02 ENCOUNTER — Ambulatory Visit: Payer: PPO | Admitting: Neurology

## 2020-08-02 ENCOUNTER — Encounter: Payer: Self-pay | Admitting: Neurology

## 2020-08-02 ENCOUNTER — Other Ambulatory Visit: Payer: Self-pay

## 2020-08-02 VITALS — BP 135/70 | HR 71 | Resp 18 | Ht 66.0 in | Wt 126.0 lb

## 2020-08-02 DIAGNOSIS — G35 Multiple sclerosis: Secondary | ICD-10-CM | POA: Diagnosis not present

## 2020-08-02 DIAGNOSIS — G44209 Tension-type headache, unspecified, not intractable: Secondary | ICD-10-CM

## 2020-09-01 ENCOUNTER — Ambulatory Visit: Payer: PPO | Admitting: Nurse Practitioner

## 2020-09-01 ENCOUNTER — Other Ambulatory Visit: Payer: Self-pay

## 2020-09-01 ENCOUNTER — Encounter: Payer: Self-pay | Admitting: Nurse Practitioner

## 2020-09-01 ENCOUNTER — Ambulatory Visit (INDEPENDENT_AMBULATORY_CARE_PROVIDER_SITE_OTHER): Payer: PPO

## 2020-09-01 VITALS — BP 108/68 | HR 56 | Ht 66.0 in | Wt 126.0 lb

## 2020-09-01 DIAGNOSIS — R002 Palpitations: Secondary | ICD-10-CM

## 2020-09-01 DIAGNOSIS — R0609 Other forms of dyspnea: Secondary | ICD-10-CM

## 2020-09-01 DIAGNOSIS — R0600 Dyspnea, unspecified: Secondary | ICD-10-CM | POA: Diagnosis not present

## 2020-09-01 DIAGNOSIS — I2510 Atherosclerotic heart disease of native coronary artery without angina pectoris: Secondary | ICD-10-CM

## 2020-09-01 NOTE — Progress Notes (Signed)
Office Visit    Patient Name: Morgan Herrera Date of Encounter: 09/01/2020  Primary Care Provider:  Pleas Koch, NP Primary Cardiologist:  Kathlyn Sacramento, MD  Chief Complaint    69 year old female with a history of multiple sclerosis, optic neuritis, polymyalgia rheumatica, remote tobacco abuse, migraines, and chest pain with catheterization suggesting endothelial dysfunction in the LAD in 2019, who presents for follow-up of chest pain.  Past Medical History    Past Medical History:  Diagnosis Date  . (HFpEF) heart failure with preserved ejection fraction ([place])    a. 03/2018 Echo: EF 55-60%, no rwma. Mod MR. Nl PASP; b. 03/2019 Echo: EF 60-65%, ? DD, mod MR.  . Chest pain    a. 03/2018 Ex MV: rest only imaging showed ant wall perfusion defect. No stress imaging as pt developed hypotension w/ exercise; b. 05/2018 Cath: Nl cors w/ sluggish flow in the LAD suggestive of endothelial dysfxn. EF 55-65%.  . Migraine   . Multiple sclerosis ([place])   . Neuromuscular disorder ([place])   . PMR (polymyalgia rheumatica) ([facility])   . Pulmonary nodule    a. 03/2018 CTA Chest: 40mm small spiculated nodular density @ the L lung apex. Rec f/u in 3 mos.   Past Surgical History:  Procedure Laterality Date  . BREAST EXCISIONAL BIOPSY    . BREAST SURGERY  1990  . COLONOSCOPY WITH PROPOFOL N/A 05/19/2015   Procedure: COLONOSCOPY WITH PROPOFOL;  Surgeon: Hulen Luster, MD;  Location: [facility] ENDOSCOPY;  Service: Gastroenterology;  Laterality: N/A;  . EYE SURGERY     1978 1st of 3 eye surgeries  . LEFT HEART CATH AND CORONARY ANGIOGRAPHY N/A 04/25/2018   Procedure: LEFT HEART CATH AND CORONARY ANGIOGRAPHY;  Surgeon: Wellington Hampshire, MD;  Location: [facility] LAB;  Service: Cardiovascular;  Laterality: N/A;  . TENDON REPAIR      Allergies  No Known Allergies  History of Present Illness    69 year old female with the above past medical history including multiple sclerosis, optic neuritis,  polymyalgia rheumatica, remote tobacco abuse, and migraines.  In July 2019, she was evaluated in the emergency room secondary to a 2-week history of fatigue, myalgias, dyspnea, and chest pain/pressure.  She had mild elevations of BNP, troponin, and LFTs.  CTA of the chest was negative for PE but showed a 9 mm left lung apical nodule with recommendation for follow-up in 3 months.  She was admitted and echocardiogram showed normal LV function.  Stress testing was ordered and while on the treadmill, she developed hypotension.  The test was canceled and she underwent diagnostic catheterization revealing normal coronary arteries with sluggish flow in the LAD suggestive of endothelial dysfunction.  LV function was normal.  Medical therapy was recommended and she was initially placed on a long-acting nitrate therapy but this was discontinued secondary to headaches.  Medical management has been limited by soft blood pressures and bradycardia.  She was last seen via telemedicine visit in May 2020, at which time she reported a 14-month history of exertional dyspnea, especially with inclines.  Echocardiogram was performed and showed normal LV function with moderate mitral regurgitation.  She was subsequently referred back to pulmonology, though did not follow through.  Overall, she has been relatively stable over the past year and a half.  She walks roughly 3 to 5 miles a day but has noted that over the past 6 months, she is more likely to experience dyspnea that will cause her to have to  rest during these walks.  She rarely will have an episode of chest discomfort, maybe once or twice a year which can last several hours at a time prior to resolving spontaneously.  She is not sure if chest discomfort symptoms are similar to what led to her hospitalization previously.  She is also experience occasional palpitations and these are sometimes associated with sudden onset and offset of dyspnea that occurs with every day activities,  such as walking in her home or out to the mailbox.  When the sudden dyspnea episodes occur, she also notes significant draining and fatigue, that resolves almost as quickly as it started.  She denies PND, orthopnea, dizziness, syncope, edema, or early satiety.  Home Medications    Prior to Admission medications   Medication Sig Start Date End Date Taking? Authorizing Provider  albuterol (VENTOLIN HFA) 108 (90 Base) MCG/ACT inhaler Inhale 1-2 puffs into the lungs every 6 (six) hours as needed for wheezing or shortness of breath. 05/07/19   Fransico Meadow, PA-C  AUBAGIO 14 MG TABS Take 1 tablet by mouth once per day 02/19/20   Pieter Partridge, DO  calcium carbonate (OS-CAL - DOSED IN MG OF ELEMENTAL CALCIUM) 1250 (500 Ca) MG tablet Take 1 tablet by mouth daily.    [provider]  cholecalciferol (VITAMIN D) 1000 units tablet Take 2,000 Units by mouth daily.    [provider]  cyanocobalamin 500 MCG tablet Take 500 mcg by mouth daily.    [provider]  escitalopram (LEXAPRO) 10 MG tablet TAKE 1 TABLET (10 MG TOTAL) BY MOUTH DAILY. FOR DEPRESSION 03/09/20   Pleas Koch, NP  furosemide (LASIX) 20 MG tablet TAKE 1 TABLET BY MOUTH EVERY DAY 07/26/20   Pleas Koch, NP  gabapentin (NEURONTIN) 100 MG capsule TAKE 1 CAPSULE BY MOUTH THREE TIMES A DAY Patient taking differently: Takes only one tablet at hs 02/11/19   Jaffe, Adam R, DO  zolpidem (AMBIEN) 10 MG tablet Take 0.5-1 tablets (5-10 mg total) by mouth at bedtime as needed for sleep. 02/09/20   Pleas Koch, NP    Review of Systems    Persistent dyspnea on exertion with rare episodes of chest pain dating back to 2019.  Occasional palpitations.  Occasional sudden onset and offset of dyspnea and fatigue.  All other systems reviewed and are otherwise negative except as noted above.  Physical Exam    VS:  BP 108/68   Pulse (!) 56   Ht 5\' 6"  (1.676 m)   Wt 126 lb (57.2 kg)   BMI 20.34 kg/m  , BMI Body  mass index is 20.34 kg/m. GEN: Well nourished, well developed, in no acute distress. HEENT: normal. Neck: Supple, no JVD, carotid bruits, or masses. Cardiac: RRR, no murmurs, rubs, or gallops. No clubbing, cyanosis, edema.  Radials/PT 2+ and equal bilaterally.  Respiratory:  Respirations regular and unlabored, clear to auscultation bilaterally. GI: Soft, nontender, nondistended, BS + x 4. MS: no deformity or atrophy. Skin: warm and dry, no rash. Neuro:  Strength and sensation are intact. Psych: Normal affect.  Accessory Clinical Findings    ECG personally reviewed by me today -sinus bradycardia, 56, rightward axis- no acute changes.  Lab Results  Component Value Date   WBC 6.0 07/21/2020   HGB 13.0 07/21/2020   HCT 40.3 07/21/2020   MCV 86.8 07/21/2020   PLT 238.0 07/21/2020   Lab Results  Component Value Date   CREATININE 0.78 07/21/2020   BUN 29 (  H) 07/21/2020   NA 139 07/21/2020   K 4.3 07/21/2020   CL 104 07/21/2020   CO2 31 07/21/2020   Lab Results  Component Value Date   ALT 21 07/21/2020   AST 23 07/21/2020   ALKPHOS 44 07/21/2020   BILITOT 0.5 07/21/2020   Lab Results  Component Value Date   CHOL 200 04/24/2018   HDL 53 04/24/2018   LDLCALC 125 (H) 04/24/2018   TRIG 110 04/24/2018   CHOLHDL 3.8 04/24/2018    Lab Results  Component Value Date   HGBA1C 5.9 (H) 04/24/2018    Assessment & Plan    1.  Chronic exertional dyspnea: Patient has been experiencing this dating back to her hospitalization in July 2019.  She reported some worsening of dyspnea when she was seen in clinic in May 2020 and an echocardiogram was performed and showed normal LV function with moderate mitral regurgitation.  Overall, she feels that the dyspnea that she experiences with activity is unchanged.  She still walks 3 to 5 miles per day.  She has been having occasional episodes of sudden onset and offset of dyspnea associated with fatigue which has been more bothersome.  These  episodes are sometimes associated with palpitations and for that reason, I am placing a Zio monitor to rule out arrhythmia as a potential cause of some of her symptoms.  She is euvolemic on examination today.  We discussed the potential role of endothelial dysfunction in her symptoms as well as limitations in treatment secondary to soft blood pressures and bradycardia.  If Zio unrevealing, will again recommend follow-up with pulmonology.  2.  Palpitations: Occasionally noted during symptoms of sudden dyspnea and fatigue.  Placing Zio monitor as above.  3.  Endothelial dysfunction of coronary artery: Rare episodes of chest pain dating back to original event in 2019.  Maybe twice a year, sometimes lasting hours at a time.  Medical management limited secondary to soft blood pressures and bradycardia.  In general, patient is quite well with activity without chest pain.  4.  Disposition: Follow-up Zio monitoring.  Follow-up in clinic in approximately 2 to 3 months or sooner if necessary.   Murray Hodgkins, NP 09/01/2020, 5:26 PM

## 2020-09-01 NOTE — Patient Instructions (Signed)
Medication Instructions:   1. Your physician recommends that you continue on your current medications as directed. Please refer to the Current Medication list given to you today.  *If you need a refill on your cardiac medications before your next appointment, please call your pharmacy*   Lab Work:  1. None Ordered  If you have labs (blood work) drawn today and your tests are completely normal, you will receive your results only by: Marland Kitchen MyChart Message (if you have MyChart) OR . A paper copy in the mail If you have any lab test that is abnormal or we need to change your treatment, we will call you to review the results.   Testing/Procedures:  1. Your physician has recommended that you wear a Zio monitor. This monitor is a medical device that records the heart's electrical activity. Doctors most often use these monitors to diagnose arrhythmias. Arrhythmias are problems with the speed or rhythm of the heartbeat. The monitor is a small device applied to your chest. You can wear one while you do your normal daily activities. While wearing this monitor if you have any symptoms to push the button and record what you felt. Once you have worn this monitor for the period of time provider prescribed (Usually 14 days), you will return the monitor device in the postage paid box. Once it is returned they will download the data collected and provide [place] with a report which the provider will then review and we will call you with those results. Important tips:  1. Avoid showering during the first 24 hours of wearing the monitor. 2. Avoid excessive sweating to help maximize wear time. 3. Do not submerge the device, no hot tubs, and no swimming pools. 4. Keep any lotions or oils away from the patch. 5. After 24 hours you may shower with the patch on. Take brief showers with your back facing the shower head.  6. Do not remove patch once it has been placed because that will interrupt data and decrease adhesive  wear time. 7. Push the button when you have any symptoms and write down what you were feeling. 8. Once you have completed wearing your monitor, remove and place into box which has postage paid and place in your outgoing mailbox.  9. If for some reason you have misplaced your box then call our office and we can provide another box and/or mail it off for you.         Follow-Up: At [facility], you and your health needs are our priority.  As part of our continuing mission to provide you with exceptional heart care, we have created designated Provider Care Teams.  These Care Teams include your primary Cardiologist (physician) and Advanced Practice Providers (APPs -  Physician Assistants and Nurse Practitioners) who all work together to provide you with the care you need, when you need it.  We recommend signing up for the patient portal called "MyChart".  Sign up information is provided on this After Visit Summary.  MyChart is used to connect with patients for Virtual Visits (Telemedicine).  Patients are able to view lab/test results, encounter notes, upcoming appointments, etc.  Non-urgent messages can be sent to your provider as well.   To learn more about what you can do with MyChart, go to [email or web address].    Your next appointment:   6-8 week(s)  The format for your next appointment:   In Person  Provider:   Kathlyn Sacramento, MD or Murray Hodgkins, NP

## 2020-09-12 ENCOUNTER — Other Ambulatory Visit: Payer: Self-pay

## 2020-09-12 ENCOUNTER — Ambulatory Visit (INDEPENDENT_AMBULATORY_CARE_PROVIDER_SITE_OTHER): Payer: PPO | Admitting: Primary Care

## 2020-09-12 ENCOUNTER — Encounter: Payer: Self-pay | Admitting: Primary Care

## 2020-09-12 VITALS — BP 110/64 | HR 49 | Temp 98.4°F | Ht 66.0 in | Wt 128.0 lb

## 2020-09-12 DIAGNOSIS — L509 Urticaria, unspecified: Secondary | ICD-10-CM | POA: Diagnosis not present

## 2020-09-12 HISTORY — DX: Urticaria, unspecified: L50.9

## 2020-09-12 MED ORDER — PREDNISONE 20 MG PO TABS: ORAL_TABLET | ORAL | 0 refills | Status: DC

## 2020-09-12 MED ORDER — METHYLPREDNISOLONE ACETATE 80 MG/ML IJ SUSP
80.0000 mg | Freq: Once | INTRAMUSCULAR | Status: AC
  Administered 2020-09-12: 80 mg via INTRAMUSCULAR

## 2020-09-12 NOTE — Progress Notes (Signed)
Subjective:    Patient ID: Morgan Herrera, female    DOB: 1951/09/05, 69 y.o.   MRN: [id number]  HPI  This visit occurred during the SARS-CoV-2 public health emergency.  Safety protocols were in place, including screening questions prior to the visit, additional usage of staff PPE, and extensive cleaning of exam room while observing appropriate contact time as indicated for disinfecting solutions.   Morgan Herrera is a 69 year old female with a history of CHF, multiple sclerosis, vitamin D deficiency, PMR who presents today with a chief complaint of rash.  The rash is located to the entire body for which she first noticed about two weeks ago. Two weeks ago she was at the cardiologist's office, was advised to wear a Zio heart monitor. Five days later she began to notice itching to her anterior thighs. Since then she's noticed gradual increase to itching and rash which is now present to upper extremities, lower extremities (anterior and posterior) posterior trunk, right breast.   She's been taking Benadryl, gabapentin, Ambien and using hydrocortisone cream with little improvement in itching and inability to fall asleep. Her rash is more calm in the morning, increases throughout the day with redness and swelling with hives.   No new lotions, detergents, soaps or shampoos. No new medicines, vitamins, supplements. No new pets. No recent outdoor exposure or poison ivy exposure. No bonfire or smoke exposure.  No recent motel or hotel stay or new beds.   No fevers/chills, oral lesions, new joint pains, tick bites, abdominal pain, nausea.   Her spouse is not itching, has no symptoms. She denies wheezing, shortness of breath throat tightness. Her monitor is due to come off in three days. She has no rash surrounding the monitor.   Review of Systems  HENT: Negative for sore throat.   Respiratory: Negative for shortness of breath and wheezing.   Skin: Positive for rash.       Itching, full body        Past Medical History:  Diagnosis Date  . (HFpEF) heart failure with preserved ejection fraction ([place])    a. 03/2018 Echo: EF 55-60%, no rwma. Mod MR. Nl PASP; b. 03/2019 Echo: EF 60-65%, ? DD, mod MR.  . Chest pain    a. 03/2018 Ex MV: rest only imaging showed ant wall perfusion defect. No stress imaging as pt developed hypotension w/ exercise; b. 05/2018 Cath: Nl cors w/ sluggish flow in the LAD suggestive of endothelial dysfxn. EF 55-65%.  . Migraine   . Multiple sclerosis ([place])   . Neuromuscular disorder ([place])   . PMR (polymyalgia rheumatica) (HCC)   . Pulmonary nodule    a. 03/2018 CTA Chest: 18mm small spiculated nodular density @ the L lung apex. Rec f/u in 3 mos.     Social History   Socioeconomic History  . Marital status: Married    Spouse name: Not on file  . Number of children: 1  . Years of education: Not on file  . Highest education level: Bachelor's degree (e.g., BA, AB, BS)  Occupational History  . Not on file  Tobacco Use  . Smoking status: Former Research scientist (life sciences)  . Smokeless tobacco: Never Used  Vaping Use  . Vaping Use: Never used  Substance and Sexual Activity  . Alcohol use: Yes    Alcohol/week: 0.0 standard drinks    Comment: 1 to 2 glass of wine at least 4 nights a week  . Drug use: No  . Sexual activity: Not on  file  Other Topics Concern  . Not on file  Social History Narrative   Married.   Moved here from [place] after 35 years   Enjoys arts and crafts. Taking a painting class.   Walking with her spouse.    Reading, shopping.   Right handed   Social Determinants of Health   Financial Resource Strain: Low Risk   . Difficulty of Paying Living Expenses: Not hard at all  Food Insecurity: No Food Insecurity  . Worried About Charity fundraiser in the Last Year: Never true  . Ran Out of Food in the Last Year: Never true  Transportation Needs: No Transportation Needs  . Lack of Transportation (Medical): No  . Lack of Transportation (Non-Medical): No   Physical Activity: Sufficiently Active  . Days of Exercise per Week: 7 days  . Minutes of Exercise per Session: 60 min  Stress: No Stress Concern Present  . Feeling of Stress : Not at all  Social Connections: Not on file  Intimate Partner Violence: Not At Risk  . Fear of Current or Ex-Partner: No  . Emotionally Abused: No  . Physically Abused: No  . Sexually Abused: No    Past Surgical History:  Procedure Laterality Date  . BREAST EXCISIONAL BIOPSY    . BREAST SURGERY  1990  . COLONOSCOPY WITH PROPOFOL N/A 05/19/2015   Procedure: COLONOSCOPY WITH PROPOFOL;  Surgeon: Hulen Luster, MD;  [facility] END[facility]COPY;  Service: Gastroenterology;  Laterality: N/A;  . EYE SURGERY     1978 1st of 3 eye surgeries  . LEFT HEART CATH AND CORONARY ANGIOGRAPHY N/A 04/25/2018   Procedure: LEFT HEART CATH AND CORONARY ANGIOGRAPHY;  Surgeon: Wellington Hampshire, MD;  Location: [place] CV LAB;  Service: Cardiovascular;  Laterality: N/A;  . TENDON REPAIR      Family History  Problem Relation Age of Onset  . Heart disease Mother   . Hypertension Mother   . Parkinson's disease Mother   . Parkinsonism Mother   . Cancer Maternal Grandmother        breast  . Breast cancer Maternal Grandmother   . Asthma Father     No Known Allergies  Current Outpatient Medications on File Prior to Visit  Medication Sig Dispense Refill  . albuterol (VENTOLIN HFA) 108 (90 Base) MCG/ACT inhaler Inhale 1-2 puffs into the lungs every 6 (six) hours as needed for wheezing or shortness of breath. 1 g 1  . AUBAGIO 14 MG TABS Take 1 tablet by mouth once per day 30 tablet 11  . calcium carbonate (OS-CAL - DOSED IN MG OF ELEMENTAL CALCIUM) 1250 (500 Ca) MG tablet Take 1 tablet by mouth daily.    . cholecalciferol (VITAMIN D) 1000 units tablet Take 2,000 Units by mouth daily.    . cyanocobalamin 500 MCG tablet Take 500 mcg by mouth daily.    Marland Kitchen escitalopram (LEXAPRO) 10 MG tablet TAKE 1 TABLET (10 MG TOTAL) BY MOUTH DAILY.  FOR DEPRESSION 90 tablet 2  . furosemide (LASIX) 20 MG tablet TAKE 1 TABLET BY MOUTH EVERY DAY 90 tablet 1  . gabapentin (NEURONTIN) 100 MG capsule TAKE 1 CAPSULE BY MOUTH THREE TIMES A DAY (Patient taking differently: Takes only one tablet at hs) 270 capsule 2  . zolpidem (AMBIEN) 10 MG tablet Take 0.5-1 tablets (5-10 mg total) by mouth at bedtime as needed for sleep. 90 tablet 0   No current facility-administered medications on file prior to visit.    BP  110/64   Pulse (!) 49   Temp 98.4 F (36.9 C) (Temporal)   Ht 5\' 6"  (1.676 m)   Wt 128 lb (58.1 kg)   SpO2 99%   BMI 20.66 kg/m    Objective:   Physical Exam Pulmonary:     Effort: Pulmonary effort is normal.     Breath sounds: No wheezing.  Skin:    Findings: Rash present.     Comments: Hives to bilateral anterior and posterior lower extremities, right breast, posterior trunk. No cellulitis or infection noted.  Neurological:     Mental Status: She is alert.            Assessment & Plan:

## 2020-09-12 NOTE — Patient Instructions (Signed)
Start prednisone tomorrow. Take 2 tablets by mouth once daily for four days, then 1 tablet once daily for four days.  You can also take Zyrtec and add in famotidine 20 mg (Pepcid) for itching today.   Remove the heart monitor today.   Please update me in a few days if no improvement.  It was a pleasure to see you today!

## 2020-09-12 NOTE — Addendum Note (Signed)
Addended by: Francella Solian on: 09/12/2020 09:05 AM   Modules accepted: Orders

## 2020-09-12 NOTE — Assessment & Plan Note (Addendum)
Acute for the last week, progressing. Today she does have hives to nearly entire body.  Nothing else has changed except for the [facility] monitor. Suspect she may be allergic to the adhesive or gel attaching the pad to her chest.   She is stable today. No respiratory involvement. Discussed to remove the Zio monitor as soon as she is home.  IM Depo Medrol 80 mg provided today. Rx for steroid course to start tomorrow.  Also discussed Zyrtec and Pepcid today. She will update in a few days if no improvement.

## 2020-09-14 ENCOUNTER — Ambulatory Visit: Payer: PPO | Admitting: Primary Care

## 2020-09-20 DIAGNOSIS — R002 Palpitations: Secondary | ICD-10-CM | POA: Diagnosis not present

## 2020-10-13 ENCOUNTER — Other Ambulatory Visit: Payer: Self-pay

## 2020-10-13 DIAGNOSIS — G4700 Insomnia, unspecified: Secondary | ICD-10-CM

## 2020-10-13 MED ORDER — ZOLPIDEM TARTRATE 10 MG PO TABS: 5.0000 mg | ORAL_TABLET | Freq: Every evening | ORAL | 0 refills | Status: DC | PRN

## 2020-10-13 NOTE — Telephone Encounter (Signed)
Please notify patient that she is due for her physical with me and February 2022. Please schedule.  Will send in 30-day supply of zolpidem.

## 2020-10-13 NOTE — Telephone Encounter (Signed)
Pharmacy requests refill on: Zolpidem 10 mg    LAST REFILL: 02/09/2020 (Q-90, R-0) LAST OV: 09/12/2020 NEXT OV: Not Scheduled  PHARMACY: [facility] [place], [place]

## 2020-10-14 NOTE — Telephone Encounter (Signed)
Called patient to schedule cpe. Busy signal kept going on. Will attempt again later.

## 2020-10-19 ENCOUNTER — Other Ambulatory Visit: Payer: Self-pay

## 2020-10-19 ENCOUNTER — Encounter: Payer: Self-pay | Admitting: Cardiovascular Disease

## 2020-10-19 ENCOUNTER — Ambulatory Visit: Payer: PPO | Admitting: Cardiovascular Disease

## 2020-10-19 VITALS — BP 116/64 | HR 63 | Ht 66.0 in | Wt 128.0 lb

## 2020-10-19 DIAGNOSIS — I471 Supraventricular tachycardia: Secondary | ICD-10-CM

## 2020-10-19 DIAGNOSIS — I5032 Chronic diastolic (congestive) heart failure: Secondary | ICD-10-CM | POA: Diagnosis not present

## 2020-10-19 NOTE — Patient Instructions (Signed)
Medication Instructions:  Your physician recommends that you continue on your current medications as directed. Please refer to the Current Medication list given to you today.  *If you need a refill on your cardiac medications before your next appointment, please call your pharmacy*   Lab Work: None orderde If you have labs (blood work) drawn today and your tests are completely normal, you will receive your results only by: Marland Kitchen MyChart Message (if you have MyChart) OR . A paper copy in the mail If you have any lab test that is abnormal or we need to change your treatment, we will call you to review the results.   Testing/Procedures: None ordered   Follow-Up: At [facility], you and your health needs are our priority.  As part of our continuing mission to provide you with exceptional heart care, we have created designated Provider Care Teams.  These Care Teams include your primary Cardiologist (physician) and Advanced Practice Providers (APPs -  Physician Assistants and Nurse Practitioners) who all work together to provide you with the care you need, when you need it.  We recommend signing up for the patient portal called "MyChart".  Sign up information is provided on this After Visit Summary.  MyChart is used to connect with patients for Virtual Visits (Telemedicine).  Patients are able to view lab/test results, encounter notes, upcoming appointments, etc.  Non-urgent messages can be sent to your provider as well.   To learn more about what you can do with MyChart, go to [email or web address].    Your next appointment:   Your physician wants you to follow-up in: 1 year  You will receive a reminder letter in the mail two months in advance. If you don't receive a letter, please call our office to schedule the follow-up appointment.   The format for your next appointment:   In Person  Provider:   You may see Kathlyn Sacramento, MD or one of the following Advanced Practice Providers on your  designated Care Team:    Murray Hodgkins, NP  Christell Faith, PA-C  Marrianne Mood, PA-C  Cadence Varna, [place]  Laurann Montana, NP    Other Instructions N/A

## 2020-10-19 NOTE — Progress Notes (Signed)
Cardiology Office Note   Date:  10/19/2020   ID:  Morgan Herrera, DOB [birth year], MRN [id number]  PCP:  Pleas Koch, NP  Cardiologist:   Kathlyn Sacramento, MD   Chief Complaint  Patient presents with  . Other    6-8 week follow up. Meds reviewed verbally with patient.       History of Present Illness: Morgan Herrera is a 70 y.o. female who presents for a follow-up visit regarding chest pain due to suspected endothelial dysfunction.  She has known history of multiple sclerosis, optic neuritis, polymyalgia rheumatica, remote tobacco use and migraines. The patient was hospitalized in July, 2019 with fatigue, shortness of breath and chest pain.  D-dimer was elevated.  CTA showed no evidence of pulmonary embolism.  BNP was mildly elevated.  Echocardiogram was unremarkable.  Stress test was aborted due to hypertensive response with exercise.  Cardiac catheterization was performed which showed normal coronary arteries with sluggish flow in the LAD suggestive of endothelial dysfunction.  EF was normal.  LVEDP was mildly elevated. She did not tolerate long-acting nitroglycerin due to headaches.   She had PFTs done in October 2019 which showed no evidence of obstructive or restrictive lung disease. An echocardiogram was repeated in June 2020 which showed normal LV systolic function and diastolic function with mild to moderate mitral regurgitation. She was most recently seen by Ignacia Bayley last month and complained of intermittent palpitations.  She had a ZIO monitor done which showed normal sinus rhythm with an average heart rate of 58 bpm.  Her lowest heart rate was 39 bpm in the morning.  166 episodes of SVT noted the longest lasted 3 minutes and 15 seconds with an average rate of 115 bpm and correlated with her symptoms. She reports that the palpitations are overall short-lived and they do not significantly interfere with her activities of daily living.  She continues to be very active  and walks 3 to 4 miles daily at least 5 days a week.  She also does yoga frequently.   Past Medical History:  Diagnosis Date  . (HFpEF) heart failure with preserved ejection fraction ([place])    a. 03/2018 Echo: EF 55-60%, no rwma. Mod MR. Nl PASP; b. 03/2019 Echo: EF 60-65%, ? DD, mod MR.  . Chest pain    a. 03/2018 Ex MV: rest only imaging showed ant wall perfusion defect. No stress imaging as pt developed hypotension w/ exercise; b. 05/2018 Cath: Nl cors w/ sluggish flow in the LAD suggestive of endothelial dysfxn. EF 55-65%.  . Migraine   . Multiple sclerosis ([place])   . Neuromuscular disorder ([place])   . PMR (polymyalgia rheumatica) (HCC)   . Pulmonary nodule    a. 03/2018 CTA Chest: 40mm small spiculated nodular density @ the L lung apex. Rec f/u in 3 mos.    Past Surgical History:  Procedure Laterality Date  . BREAST EXCISIONAL BIOPSY    . BREAST SURGERY  1990  . COLONOSCOPY WITH PROPOFOL N/A 05/19/2015   Procedure: COLONOSCOPY WITH PROPOFOL;  Surgeon: Hulen Luster, MD;  Location: [facility] ENDOSCOPY;  Service: Gastroenterology;  Laterality: N/A;  . EYE SURGERY     1978 1st of 3 eye surgeries  . LEFT HEART CATH AND CORONARY ANGIOGRAPHY N/A 04/25/2018   Procedure: LEFT HEART CATH AND CORONARY ANGIOGRAPHY;  Surgeon: Wellington Hampshire, MD;  Location: [place] CV LAB;  Service: Cardiovascular;  Laterality: N/A;  . TENDON REPAIR  Current Outpatient Medications  Medication Sig Dispense Refill  . albuterol (VENTOLIN HFA) 108 (90 Base) MCG/ACT inhaler Inhale 1-2 puffs into the lungs every 6 (six) hours as needed for wheezing or shortness of breath. 1 g 1  . AUBAGIO 14 MG TABS Take 1 tablet by mouth once per day 30 tablet 11  . calcium carbonate (OS-CAL - DOSED IN MG OF ELEMENTAL CALCIUM) 1250 (500 Ca) MG tablet Take 1 tablet by mouth daily.    . cholecalciferol (VITAMIN D) 1000 units tablet Take 2,000 Units by mouth daily.    . cyanocobalamin 500 MCG tablet Take 500 mcg by mouth daily.    Marland Kitchen  escitalopram (LEXAPRO) 10 MG tablet TAKE 1 TABLET (10 MG TOTAL) BY MOUTH DAILY. FOR DEPRESSION 90 tablet 2  . furosemide (LASIX) 20 MG tablet TAKE 1 TABLET BY MOUTH EVERY DAY 90 tablet 1  . gabapentin (NEURONTIN) 100 MG capsule TAKE 1 CAPSULE BY MOUTH THREE TIMES A DAY 270 capsule 2  . zolpidem (AMBIEN) 10 MG tablet Take 0.5-1 tablets (5-10 mg total) by mouth at bedtime as needed for sleep. 30 tablet 0   No current facility-administered medications for this visit.    Allergies:   Patient has no known allergies.    Social History:  The patient  reports that she has quit smoking. She has never used smokeless tobacco. She reports current alcohol use. She reports that she does not use drugs.   Family History:  The patient's family history includes Asthma in her father; Breast cancer in her maternal grandmother; Cancer in her maternal grandmother; Heart disease in her mother; Hypertension in her mother; Parkinson's disease in her mother; Parkinsonism in her mother.    ROS:  Please see the history of present illness.   Otherwise, review of systems are positive for none.   All other systems are reviewed and negative.    PHYSICAL EXAM: VS:  BP 116/64 (BP Location: Left Arm, Patient Position: Sitting, Cuff Size: Normal)   Pulse 63   Ht 5\' 6"  (1.676 m)   Wt 128 lb (58.1 kg)   SpO2 97%   BMI 20.66 kg/m  , BMI Body mass index is 20.66 kg/m. GEN: Well nourished, well developed, in no acute distress  HEENT: normal  Neck: no JVD, carotid bruits, or masses Cardiac: RRR; no  rubs, or gallops,no edema .  1 out of 6 systolic murmur at the aortic area.  No mitral regurgitation murmur Respiratory:  clear to auscultation bilaterally, normal work of breathing GI: soft, nontender, nondistended, + BS MS: no deformity or atrophy  Skin: warm and dry, no rash Neuro:  Strength and sensation are intact Psych: euthymic mood, full affect   EKG:  EKG is not ordered today.    Recent Labs: 07/21/2020: ALT  21; BUN 29; Creatinine, Ser 0.78; Hemoglobin 13.0; Platelets 238.0; Potassium 4.3; Sodium 139    Lipid Panel    Component Value Date/Time   CHOL 200 04/24/2018 1005   TRIG 110 04/24/2018 1005   HDL 53 04/24/2018 1005   CHOLHDL 3.8 04/24/2018 1005   VLDL 22 04/24/2018 1005   LDLCALC 125 (H) 04/24/2018 1005      Wt Readings from Last 3 Encounters:  10/19/20 128 lb (58.1 kg)  09/12/20 128 lb (58.1 kg)  09/01/20 126 lb (57.2 kg)       No flowsheet data found.    ASSESSMENT AND PLAN:  1.  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  Minimal chest pain at the  present time .  Chronic exertional dyspnea seems to be stable and she does not seem to have significant limitations.  She continues to be very active with exercise.  2.  Chronic diastolic heart failure: She appears to be euvolemic on small dose furosemide.  3.  Multiple sclerosis stable on medications.  4.  Palpitations: Seem to be due to short episodes of SVT.  The longest episode was only 3 minutes.  She does feel these episodes but overall she is able to function fine and continues to be very active.  Treating with a beta-blocker or a calcium channel blocker is problematic given baseline bradycardia.  I do not think her symptoms are significant enough to justify an antiarrhythmic medication or ablation and I think it is best to continue observation for now given that this is a benign arrhythmia.  She will continue to monitor symptoms.  I did discuss with her vagal maneuvers in case the tachycardia last long.   Disposition:   FU with me in 1 year  Signed,  Kathlyn Sacramento, MD  10/19/2020 10:32 AM    [place]

## 2020-10-20 ENCOUNTER — Ambulatory Visit: Payer: PPO | Admitting: Nurse Practitioner

## 2020-10-26 NOTE — Telephone Encounter (Signed)
Called patient to schedule CPE. Unable to reach patient. Letter sent.

## 2020-12-01 ENCOUNTER — Other Ambulatory Visit: Payer: Self-pay | Admitting: Primary Care

## 2020-12-01 DIAGNOSIS — F330 Major depressive disorder, recurrent, mild: Secondary | ICD-10-CM

## 2020-12-29 ENCOUNTER — Other Ambulatory Visit: Payer: Self-pay | Admitting: Internal Medicine

## 2020-12-29 DIAGNOSIS — Z1231 Encounter for screening mammogram for malignant neoplasm of breast: Secondary | ICD-10-CM

## 2021-01-02 ENCOUNTER — Inpatient Hospital Stay: Admission: RE | Admit: 2021-01-02 | Payer: PPO | Source: Ambulatory Visit

## 2021-01-02 DIAGNOSIS — Z1231 Encounter for screening mammogram for malignant neoplasm of breast: Secondary | ICD-10-CM

## 2021-01-06 ENCOUNTER — Other Ambulatory Visit: Payer: Self-pay

## 2021-01-06 DIAGNOSIS — G4700 Insomnia, unspecified: Secondary | ICD-10-CM

## 2021-01-10 NOTE — Telephone Encounter (Signed)
Please call patient make f/u with Anda Kraft and let me know when made will send for refill

## 2021-01-15 ENCOUNTER — Other Ambulatory Visit: Payer: Self-pay

## 2021-01-15 DIAGNOSIS — G4700 Insomnia, unspecified: Secondary | ICD-10-CM

## 2021-01-16 ENCOUNTER — Other Ambulatory Visit: Payer: Self-pay

## 2021-01-16 DIAGNOSIS — G4700 Insomnia, unspecified: Secondary | ICD-10-CM

## 2021-01-17 ENCOUNTER — Other Ambulatory Visit: Payer: Self-pay

## 2021-01-17 NOTE — Telephone Encounter (Signed)
She has an appointment scheduled for 01/18/21. Does she have enough to last until tomorrow? If so then I can authorize 90 day supply tomorrow.

## 2021-01-17 NOTE — Telephone Encounter (Signed)
Duplicate request have sent message to make appointment for follow up. Ok to give 30 day refill?

## 2021-01-17 NOTE — Telephone Encounter (Signed)
Patient came by the office to find out why her medication hasn't been refilled.  Patient said she has 3 days of pills left.  Patient scheduled f/u appointment with Anda Kraft on 01/18/21. She said the refill can wait until Anda Kraft sees her tomorrow.

## 2021-01-18 ENCOUNTER — Ambulatory Visit (INDEPENDENT_AMBULATORY_CARE_PROVIDER_SITE_OTHER): Payer: PPO | Admitting: Primary Care

## 2021-01-18 ENCOUNTER — Encounter: Payer: Self-pay | Admitting: Primary Care

## 2021-01-18 ENCOUNTER — Other Ambulatory Visit: Payer: Self-pay

## 2021-01-18 VITALS — BP 118/76 | HR 88 | Temp 98.6°F | Ht 66.0 in | Wt 125.0 lb

## 2021-01-18 DIAGNOSIS — G4700 Insomnia, unspecified: Secondary | ICD-10-CM

## 2021-01-18 DIAGNOSIS — Z0000 Encounter for general adult medical examination without abnormal findings: Secondary | ICD-10-CM | POA: Diagnosis not present

## 2021-01-18 DIAGNOSIS — K5900 Constipation, unspecified: Secondary | ICD-10-CM | POA: Diagnosis not present

## 2021-01-18 DIAGNOSIS — E2839 Other primary ovarian failure: Secondary | ICD-10-CM | POA: Diagnosis not present

## 2021-01-18 DIAGNOSIS — I5032 Chronic diastolic (congestive) heart failure: Secondary | ICD-10-CM | POA: Diagnosis not present

## 2021-01-18 DIAGNOSIS — G35 Multiple sclerosis: Secondary | ICD-10-CM | POA: Diagnosis not present

## 2021-01-18 DIAGNOSIS — Z1231 Encounter for screening mammogram for malignant neoplasm of breast: Secondary | ICD-10-CM

## 2021-01-18 DIAGNOSIS — F3342 Major depressive disorder, recurrent, in full remission: Secondary | ICD-10-CM | POA: Diagnosis not present

## 2021-01-18 DIAGNOSIS — E559 Vitamin D deficiency, unspecified: Secondary | ICD-10-CM | POA: Diagnosis not present

## 2021-01-18 DIAGNOSIS — R519 Headache, unspecified: Secondary | ICD-10-CM | POA: Diagnosis not present

## 2021-01-18 LAB — LIPID PANEL
Cholesterol: 203 mg/dL — ABNORMAL HIGH (ref 0–200)
HDL: 77.5 mg/dL (ref >39.00)
LDL Cholesterol: 88 mg/dL (ref 0–99)
NonHDL: 125.5
Total CHOL/HDL Ratio: 3
Triglycerides: 188 mg/dL — ABNORMAL HIGH (ref 0.0–149.0)
VLDL: 37.6 mg/dL (ref 0.0–40.0)

## 2021-01-18 LAB — CBC
HCT: 39.6 % (ref 36.0–46.0)
Hemoglobin: 12.8 g/dL (ref 12.0–15.0)
MCHC: 32.3 g/dL (ref 30.0–36.0)
MCV: 87.9 fl (ref 78.0–100.0)
Platelets: 253 10*3/uL (ref 150.0–400.0)
RBC: 4.5 Mil/uL (ref 3.87–5.11)
RDW: 13.7 % (ref 11.5–15.5)
WBC: 5.6 10*3/uL (ref 4.0–10.5)

## 2021-01-18 LAB — COMPREHENSIVE METABOLIC PANEL
ALT: 24 U/L (ref 0–35)
AST: 24 U/L (ref 0–37)
Albumin: 4.3 g/dL (ref 3.5–5.2)
Alkaline Phosphatase: 46 U/L (ref 39–117)
BUN: 26 mg/dL — ABNORMAL HIGH (ref 6–23)
CO2: 29 mEq/L (ref 19–32)
Calcium: 9.6 mg/dL (ref 8.4–10.5)
Chloride: 103 mEq/L (ref 96–112)
Creatinine, Ser: 0.74 mg/dL (ref 0.40–1.20)
GFR: 82.6 mL/min (ref >60.00)
Glucose, Bld: 84 mg/dL (ref 70–99)
Potassium: 4 mEq/L (ref 3.5–5.1)
Sodium: 140 mEq/L (ref 135–145)
Total Bilirubin: 0.6 mg/dL (ref 0.2–1.2)
Total Protein: 6.8 g/dL (ref 6.0–8.3)

## 2021-01-18 MED ORDER — ZOLPIDEM TARTRATE 10 MG PO TABS: 5.0000 mg | ORAL_TABLET | Freq: Every evening | ORAL | 0 refills | Status: DC | PRN

## 2021-01-18 NOTE — Assessment & Plan Note (Signed)
Intermittent, occurring twice weekly on average, located to various regions.  She will try increasing gabapentin to 200 mg. She will also notify her neurologist.

## 2021-01-18 NOTE — Assessment & Plan Note (Addendum)
Following with neurology semi-annually, doing well on Aubagio 14 mg, and gabapentin 100 mg daily, continue same.

## 2021-01-18 NOTE — Assessment & Plan Note (Signed)
Chronic, doing well overall.  Continue PRN Miralax for which helps.

## 2021-01-18 NOTE — Assessment & Plan Note (Signed)
Doing well on Zolpidem 5 mg, continue same.  Refill provided.

## 2021-01-18 NOTE — Assessment & Plan Note (Signed)
Compliant to calcium and vitamin D daily.

## 2021-01-18 NOTE — Progress Notes (Signed)
Subjective:    Patient ID: Morgan Herrera, female    DOB: 12/09/1950, 70 y.o.   MRN: [id number]  HPI  Morgan Herrera is a very pleasant 70 y.o. female who presents today for complete physical.  Immunizations: -Tetanus: 2016 -Influenza: Completed this season  -Covid-19: Completed three vaccines -Shingles: Completed Zostavax and Shingrix -Pneumonia: Completed Prevnar 13 in 2019, pneumovax in 2016   Diet: She endorses a fair diet Exercise: She is active   Eye exam: Completes annually Dental exam: Completes semi-annually   Mammogram: Scheduled for May 2022 Dexa: Due Colonoscopy: Completed in 2016, due in 2026   Wt Readings from Last 3 Encounters:  01/18/21 125 lb (56.7 kg)  10/19/20 128 lb (58.1 kg)  09/12/20 128 lb (58.1 kg)       Review of Systems  Constitutional: Negative for unexpected weight change.  HENT: Negative for rhinorrhea.   Eyes: Negative for visual disturbance.  Respiratory: Negative for cough and shortness of breath.   Cardiovascular: Negative for chest pain.  Gastrointestinal: Negative for constipation and diarrhea.  Genitourinary: Negative for difficulty urinating.  Musculoskeletal: Negative for arthralgias.  Skin: Negative for rash.  Allergic/Immunologic: Negative for environmental allergies.  Neurological: Positive for headaches. Negative for dizziness.  Psychiatric/Behavioral: Negative for sleep disturbance. The patient is not nervous/anxious.          Past Medical History:  Diagnosis Date  . (HFpEF) heart failure with preserved ejection fraction ([place])    a. 03/2018 Echo: EF 55-60%, no rwma. Mod MR. Nl PASP; b. 03/2019 Echo: EF 60-65%, ? DD, mod MR.  . Chest pain    a. 03/2018 Ex MV: rest only imaging showed ant wall perfusion defect. No stress imaging as pt developed hypotension w/ exercise; b. 05/2018 Cath: Nl cors w/ sluggish flow in the LAD suggestive of endothelial dysfxn. EF 55-65%.  . Migraine   . Multiple sclerosis ([place])   .  Neuromuscular disorder ([place])   . PMR (polymyalgia rheumatica) (HCC)   . Pulmonary nodule    a. 03/2018 CTA Chest: 27mm small spiculated nodular density @ the L lung apex. Rec f/u in 3 mos.    Social History   Socioeconomic History  . Marital status: Married    Spouse name: Not on file  . Number of children: 1  . Years of education: Not on file  . Highest education level: Bachelor's degree (e.g., BA, AB, BS)  Occupational History  . Not on file  Tobacco Use  . Smoking status: Former Research scientist (life sciences)  . Smokeless tobacco: Never Used  Vaping Use  . Vaping Use: Never used  Substance and Sexual Activity  . Alcohol use: Yes    Alcohol/week: 0.0 standard drinks    Comment: 1 to 2 glass of wine at least 4 nights a week  . Drug use: No  . Sexual activity: Not on file  Other Topics Concern  . Not on file  Social History Narrative   Married.   Moved here from [place] after 35 years   Enjoys arts and crafts. Taking a painting class.   Walking with her spouse.    Reading, shopping.   Right handed   Social Determinants of Health   Financial Resource Strain: Low Risk   . Difficulty of Paying Living Expenses: Not hard at all  Food Insecurity: No Food Insecurity  . Worried About Charity fundraiser in the Last Year: Never true  . Ran Out of Food in the Last Year: Never true  Transportation Needs: No Transportation Needs  . Lack of Transportation (Medical): No  . Lack of Transportation (Non-Medical): No  Physical Activity: Sufficiently Active  . Days of Exercise per Week: 7 days  . Minutes of Exercise per Session: 60 min  Stress: No Stress Concern Present  . Feeling of Stress : Not at all  Social Connections: Not on file  Intimate Partner Violence: Not At Risk  . Fear of Current or Ex-Partner: No  . Emotionally Abused: No  . Physically Abused: No  . Sexually Abused: No    Past Surgical History:  Procedure Laterality Date  . BREAST EXCISIONAL BIOPSY    . BREAST SURGERY  1990  .  COLONOSCOPY WITH PROPOFOL N/A 05/19/2015   Procedure: COLONOSCOPY WITH PROPOFOL;  Surgeon: Hulen Luster, MD;  Location: [facility] ENDOSCOPY;  Service: Gastroenterology;  Laterality: N/A;  . EYE SURGERY     1978 1st of 3 eye surgeries  . LEFT HEART CATH AND CORONARY ANGIOGRAPHY N/A 04/25/2018   Procedure: LEFT HEART CATH AND CORONARY ANGIOGRAPHY;  Surgeon: Wellington Hampshire, MD;  Location: [facility] LAB;  Service: Cardiovascular;  Laterality: N/A;  . TENDON REPAIR      Family History  Problem Relation Age of Onset  . Heart disease Mother   . Hypertension Mother   . Parkinson's disease Mother   . Parkinsonism Mother   . Cancer Maternal Grandmother        breast  . Breast cancer Maternal Grandmother   . Asthma Father     No Known Allergies  Current Outpatient Medications on File Prior to Visit  Medication Sig Dispense Refill  . albuterol (VENTOLIN HFA) 108 (90 Base) MCG/ACT inhaler Inhale 1-2 puffs into the lungs every 6 (six) hours as needed for wheezing or shortness of breath. 1 g 1  . AUBAGIO 14 MG TABS Take 1 tablet by mouth once per day 30 tablet 11  . calcium carbonate (OS-CAL - DOSED IN MG OF ELEMENTAL CALCIUM) 1250 (500 Ca) MG tablet Take 1 tablet by mouth daily.    . cholecalciferol (VITAMIN D) 1000 units tablet Take 2,000 Units by mouth daily.    . cyanocobalamin 500 MCG tablet Take 500 mcg by mouth daily.    Marland Kitchen escitalopram (LEXAPRO) 10 MG tablet TAKE 1 TABLET (10 MG TOTAL) BY MOUTH DAILY. FOR DEPRESSION 90 tablet 1  . furosemide (LASIX) 20 MG tablet TAKE 1 TABLET BY MOUTH EVERY DAY 90 tablet 1  . gabapentin (NEURONTIN) 100 MG capsule TAKE 1 CAPSULE BY MOUTH THREE TIMES A DAY 270 capsule 2  . zolpidem (AMBIEN) 10 MG tablet Take 0.5-1 tablets (5-10 mg total) by mouth at bedtime as needed for sleep. 30 tablet 0   No current facility-administered medications on file prior to visit.    BP 118/76   Pulse 88   Temp 98.6 F (37 C) (Temporal)   Ht 5\' 6"  (1.676 m)   Wt 125 lb  (56.7 kg)   SpO2 100%   BMI 20.18 kg/m  Objective:   Physical Exam HENT:     Right Ear: Tympanic membrane and ear canal normal.     Left Ear: Tympanic membrane and ear canal normal.     Nose: Nose normal.  Eyes:     Conjunctiva/sclera: Conjunctivae normal.     Pupils: Pupils are equal, round, and reactive to light.  Neck:     Thyroid: No thyromegaly.  Cardiovascular:     Rate and Rhythm: Normal rate and regular rhythm.  Heart sounds: No murmur heard.   Pulmonary:     Effort: Pulmonary effort is normal.     Breath sounds: Normal breath sounds. No rales.  Abdominal:     General: Bowel sounds are normal.     Palpations: Abdomen is soft.     Tenderness: There is no abdominal tenderness.  Musculoskeletal:        General: Normal range of motion.     Cervical back: Neck supple.  Lymphadenopathy:     Cervical: No cervical adenopathy.  Skin:    General: Skin is warm and dry.     Findings: No rash.  Neurological:     Mental Status: She is alert and oriented to person, place, and time.     Cranial Nerves: No cranial nerve deficit.     Deep Tendon Reflexes: Reflexes are normal and symmetric.  Psychiatric:        Mood and Affect: Mood normal.           Assessment & Plan:      This visit occurred during the SARS-CoV-2 public health emergency.  Safety protocols were in place, including screening questions prior to the visit, additional usage of staff PPE, and extensive cleaning of exam room while observing appropriate contact time as indicated for disinfecting solutions.

## 2021-01-18 NOTE — Assessment & Plan Note (Signed)
Doing well on Lexapro 10 mg, continue same.

## 2021-01-18 NOTE — Patient Instructions (Signed)
Stop by the lab prior to leaving today. I will notify you of your results once received.   Complete your bone density scan and mammogram.  Continue to work on a healthy diet. Ensure you are consuming 64 ounces of water daily.  It was a pleasure to see you today!

## 2021-01-18 NOTE — Assessment & Plan Note (Signed)
Immunizations UTD. Bone density scan due, order placed. Mammogram scheduled.  Colonoscopy UTD due in 2026

## 2021-01-18 NOTE — Assessment & Plan Note (Signed)
Following with cardiology, continue furosemide 20 mg daily.

## 2021-01-19 ENCOUNTER — Other Ambulatory Visit: Payer: Self-pay | Admitting: Neurology

## 2021-01-19 ENCOUNTER — Other Ambulatory Visit: Payer: Self-pay | Admitting: Primary Care

## 2021-01-19 DIAGNOSIS — R609 Edema, unspecified: Secondary | ICD-10-CM

## 2021-01-20 NOTE — Telephone Encounter (Signed)
Attempted to reach patient to schedule. Received busy signal

## 2021-01-20 NOTE — Telephone Encounter (Signed)
Attempted to reach patient. Received busy signal

## 2021-01-31 ENCOUNTER — Other Ambulatory Visit: Payer: Self-pay

## 2021-01-31 ENCOUNTER — Telehealth: Payer: Self-pay | Admitting: Neurology

## 2021-01-31 ENCOUNTER — Other Ambulatory Visit: Payer: PPO

## 2021-01-31 DIAGNOSIS — G35 Multiple sclerosis: Secondary | ICD-10-CM

## 2021-01-31 NOTE — Progress Notes (Signed)
NEUROLOGY FOLLOW UP OFFICE NOTE  Morgan Herrera [id number]  Assessment/Plan:   1.  Multiple sclerosis 2.  Tension-type headache   1.  DMT:  Aubagio 2.  Increase D3 to 4000 IU daily 3.  Repeat CBC with diff, CMP and vit D in 6 months 4.  Follow up in 6 months.  Subjective:  Morgan Herrera is a 70year old right-handed woman with multiple sclerosis, polymyalgia rheumatica and migraine who follows up for multiple sclerosis.  UPDATE: Current medications: Aubagio, B12 1012mcg daily,gabapentin 100mg  TID PRN; Lexapro 10mg  daily; Ambien;D3 2000 IU daily.   01/18/2021 LABS:  CBC with WBC 5.6, HGB 12.8, HCT 39.6, PLT 253, ALC 1; hepatic panel with t bili 0.6, ALP 46, AST 24, ALT 24 01/31/2021 LABS:  D 38.72  Vision:Chronic right sided peripheral vision loss Motor: No concerns Sensory: none Pain: Neck and shoulder pain/aches.  Takes magnesium for cramps in feet, which help. Gait: Sometimes may feel briefly off balance.   Bowel/Bladder: No concerns Fatigue: Sometimes has episodes of lethargy. Cognition: No concerns Mood: Some stress.   Tension-type headaches: They are mild to moderate occipital/biptemporal pressure headaches occurring 3 to 4 days and resolves for a couple of days.  Treats with Aleve or Tylenol (only takes one day a week).  At one time, stabbing pain in back of right ear which resolved.    HISTORY: She was diagnosed with MS at age 24, when she presented with bilateral optic neuritis, right worse than left. She was treated with IV steroids at that time.. In hindsight, she recalls brief episodes from her past. In her late-20s, she had a 3 week episode of dizziness and ataxia. She had brief episodes of limb paresthesias lasting a couple of days.  Work up included MRIs. MRIs of the brain have shown lesions suspicious for MS. Prior cervical MRI reportedly normal.  She never had a relapse and therefore never required further IV steroids. However,  she was diagnosed with polymyalgia rheumatica several years ago, after experiencing severe joint pain and stiffness.   She occasionally has fatigue once in a while, sometimes lasting for 2 weeks. She does not exhibit Lhermitte's sign. She reports burning and aching on the bottom of her feet when she walks for prolonged period.  She works part-time as a Radiation protection practitioner. For the past couple of years, she reports word-finding difficulty. Sometimes it is difficult to concentrate. It has almost affected her work at times. She has a BA in [place]. She underwent neuropsychological testing in July 2017, which revealed anxiety and depression but no cognitive impairment.  Prior disease modifying drugs: Avonex (tired of injections), Tecfidera (nausea, flushing and unable to take aspirin)  09/06/2015:MRI BRAIN W OJ:[id number] non-enhancing T2/FLAIR hyperintensities in the periventricular white matter, some demonstrating Dawson's fingers, with mild corpus callosum involvement, which appears stable compared to 2012 and similar to imaging from 2005. 10/09/2016:MRI BRAIN W WO:  stable. 12/28/2017:MRI BRAIN W WO:  stable compared to prior imaging from 2016. 04/07/2020 MRI BRAIN W WO:  Stable mild white matter disease when compared with 2019. 04/07/2020 MRI CERVICAL SPINE W WO:  1.  Normal MRI of the cord.  2.  Cervical spine degeneration with foraminal narrowings as described.  Diffusely patent spinal canal.  PAST MEDICAL HISTORY: Past Medical History:  Diagnosis Date  . (HFpEF) heart failure with preserved ejection fraction ([place])    a. 03/2018 Echo: EF 55-60%, no rwma. Mod MR. Nl PASP; b. 03/2019 Echo: EF 60-65%, ? DD, mod MR.  Marland Kitchen  Chest pain    a. 03/2018 Ex MV: rest only imaging showed ant wall perfusion defect. No stress imaging as pt developed hypotension w/ exercise; b. 05/2018 Cath: Nl cors w/ sluggish flow in the LAD suggestive of endothelial dysfxn. EF 55-65%.  . Migraine   . Multiple  sclerosis ([place])   . Neuromuscular disorder ([place])   . PMR (polymyalgia rheumatica) (HCC)   . Pulmonary nodule    a. 03/2018 CTA Chest: 49mm small spiculated nodular density @ the L lung apex. Rec f/u in 3 mos.    MEDICATIONS: Current Outpatient Medications on File Prior to Visit  Medication Sig Dispense Refill  . albuterol (VENTOLIN HFA) 108 (90 Base) MCG/ACT inhaler Inhale 1-2 puffs into the lungs every 6 (six) hours as needed for wheezing or shortness of breath. 1 g 1  . AUBAGIO 14 MG TABS Take 1 tablet by mouth once per day 30 tablet 11  . calcium carbonate (OS-CAL - DOSED IN MG OF ELEMENTAL CALCIUM) 1250 (500 Ca) MG tablet Take 1 tablet by mouth daily.    . cholecalciferol (VITAMIN D) 1000 units tablet Take 2,000 Units by mouth daily.    . cyanocobalamin 500 MCG tablet Take 500 mcg by mouth daily.    Marland Kitchen escitalopram (LEXAPRO) 10 MG tablet TAKE 1 TABLET (10 MG TOTAL) BY MOUTH DAILY. FOR DEPRESSION 90 tablet 1  . furosemide (LASIX) 20 MG tablet TAKE 1 TABLET BY MOUTH EVERY DAY 90 tablet 3  . gabapentin (NEURONTIN) 100 MG capsule TAKE 1 CAPSULE BY MOUTH THREE TIMES A DAY 270 capsule 2  . zolpidem (AMBIEN) 10 MG tablet Take 0.5-1 tablets (5-10 mg total) by mouth at bedtime as needed for sleep. 90 tablet 0   No current facility-administered medications on file prior to visit.    ALLERGIES: No Known Allergies  FAMILY HISTORY: Family History  Problem Relation Age of Onset  . Heart disease Mother   . Hypertension Mother   . Parkinson's disease Mother   . Parkinsonism Mother   . Cancer Maternal Grandmother        breast  . Breast cancer Maternal Grandmother   . Asthma Father       Objective:  Blood pressure 118/64, pulse 76, height 5\' 6"  (1.676 m), weight 125 lb (56.7 kg), SpO2 95 %. General: No acute distress.  Patient appears well-groomed.   Head:  Normocephalic/atraumatic Eyes:  Fundi examined but not visualized Neck: supple, no paraspinal tenderness, full range of  motion Heart:  Regular rate and rhythm Lungs:  Clear to auscultation bilaterally Back: No paraspinal tenderness Neurological Exam: alert and oriented to person, place, peech fluent and not dysarthric, language intact.  CN II-XII intact. Bulk and tone normal, muscle strength 5/5 throughout.  Sensation to light touch, temperature and vibration intact.  Deep tendon reflexes 2+ throughout, toes downgoing.  Finger to nose and heel to shin testing intact.  Gait normal, Romberg negative.     Metta Clines, DO  CC: Alma Friendly, NP

## 2021-01-31 NOTE — Telephone Encounter (Signed)
Patient called to see if she needed to come in for some lab work today before her appointment with Dr. Tomi Likens on 02/02/21?

## 2021-01-31 NOTE — Telephone Encounter (Signed)
Ordered vit d, patient just had other labs done at pcp.

## 2021-02-01 LAB — VITAMIN D 25 HYDROXY (VIT D DEFICIENCY, FRACTURES): VITD: 38.72 ng/mL (ref 30.00–100.00)

## 2021-02-02 ENCOUNTER — Encounter: Payer: Self-pay | Admitting: Neurology

## 2021-02-02 ENCOUNTER — Ambulatory Visit: Payer: PPO | Admitting: Neurology

## 2021-02-02 ENCOUNTER — Other Ambulatory Visit: Payer: Self-pay

## 2021-02-02 ENCOUNTER — Other Ambulatory Visit: Payer: PPO

## 2021-02-02 VITALS — BP 118/64 | HR 76 | Ht 66.0 in | Wt 125.0 lb

## 2021-02-02 DIAGNOSIS — G35 Multiple sclerosis: Secondary | ICD-10-CM

## 2021-02-02 DIAGNOSIS — G44219 Episodic tension-type headache, not intractable: Secondary | ICD-10-CM | POA: Diagnosis not present

## 2021-02-02 NOTE — Patient Instructions (Signed)
1.  Increase vitamin D3 to 4000 IU daily 2.  Continue Aubagio 3.  Check CBC with diff, CMP and vitamin D in 6 months. 4.  Follow up 6 months.

## 2021-02-21 ENCOUNTER — Other Ambulatory Visit: Payer: Self-pay

## 2021-02-21 ENCOUNTER — Ambulatory Visit
Admission: RE | Admit: 2021-02-21 | Discharge: 2021-02-21 | Disposition: A | Discharge status: Home / Self Care | Payer: PPO | Source: Ambulatory Visit | Attending: Internal Medicine | Admitting: Internal Medicine

## 2021-02-21 DIAGNOSIS — Z1231 Encounter for screening mammogram for malignant neoplasm of breast: Secondary | ICD-10-CM | POA: Diagnosis not present

## 2021-02-21 DIAGNOSIS — M62838 Other muscle spasm: Secondary | ICD-10-CM

## 2021-02-21 MED ORDER — GABAPENTIN 100 MG PO CAPS: ORAL_CAPSULE | ORAL | 2 refills | Status: DC

## 2021-02-21 MED ORDER — AUBAGIO 14 MG PO TABS: ORAL_TABLET | ORAL | 5 refills | Status: DC

## 2021-02-21 NOTE — Progress Notes (Signed)
Per [employer] message pt needs a refill for Gabapentin and Aubagio.   Refills sent

## 2021-04-10 DIAGNOSIS — Z20822 Contact with and (suspected) exposure to covid-19: Secondary | ICD-10-CM | POA: Diagnosis not present

## 2021-04-10 DIAGNOSIS — Z03818 Encounter for observation for suspected exposure to other biological agents ruled out: Secondary | ICD-10-CM | POA: Diagnosis not present

## 2021-04-24 DIAGNOSIS — Z20822 Contact with and (suspected) exposure to covid-19: Secondary | ICD-10-CM | POA: Diagnosis not present

## 2021-04-26 ENCOUNTER — Other Ambulatory Visit: Payer: Self-pay | Admitting: Primary Care

## 2021-04-26 DIAGNOSIS — F330 Major depressive disorder, recurrent, mild: Secondary | ICD-10-CM

## 2021-04-27 ENCOUNTER — Telehealth (INDEPENDENT_AMBULATORY_CARE_PROVIDER_SITE_OTHER): Payer: PPO | Admitting: Family Medicine

## 2021-04-27 ENCOUNTER — Encounter: Payer: Self-pay | Admitting: Family Medicine

## 2021-04-27 VITALS — BP 117/70 | HR 66 | Temp 97.7°F | Wt 121.8 lb

## 2021-04-27 DIAGNOSIS — U071 COVID-19: Secondary | ICD-10-CM | POA: Diagnosis not present

## 2021-04-27 MED ORDER — NIRMATRELVIR/RITONAVIR (PAXLOVID)TABLET: 3.0000 | ORAL_TABLET | Freq: Two times a day (BID) | ORAL | 0 refills | Status: AC

## 2021-04-27 MED ORDER — BENZONATATE 100 MG PO CAPS: 100.0000 mg | ORAL_CAPSULE | Freq: Three times a day (TID) | ORAL | 0 refills | Status: DC | PRN

## 2021-04-27 NOTE — Telephone Encounter (Addendum)
Noted and appreciate Dr. Julianne Rice evaluation

## 2021-04-27 NOTE — Progress Notes (Signed)
Virtual Visit via Video Note  I connected with Sheona  on 04/27/21 at 11:40 AM EDT by a video enabled telemedicine application and verified that I am speaking with the correct person using two identifiers.  Location patient: home, [place] Location provider:work or home office Persons participating in the virtual visit: patient, provider  I discussed the limitations of evaluation and management by telemedicine and the availability of in person appointments. The patient expressed understanding and agreed to proceed.   HPI:  Acute telemedicine visit for Covid19: -Onset: 3 days ago; had positive PCR test for covid -Symptoms include:nasal congestion, scratchy throat, cough, mild SOB at times, body aches, HA -Denies: no SOB currently, CP (except when coughs), fevers, NVD, inability to eat/drink/get out of bed -Pertinent past medical history:see below -Pertinent medication allergies:No Known Allergies -COVID-19 vaccine status: has had 2 doses and 2 boosters -GFR 82 in April  ROS: See pertinent positives and negatives per HPI.  Past Medical History:  Diagnosis Date   (HFpEF) heart failure with preserved ejection fraction ([place])    a. 03/2018 Echo: EF 55-60%, no rwma. Mod MR. Nl PASP; b. 03/2019 Echo: EF 60-65%, ? DD, mod MR.   Chest pain    a. 03/2018 Ex MV: rest only imaging showed ant wall perfusion defect. No stress imaging as pt developed hypotension w/ exercise; b. 05/2018 Cath: Nl cors w/ sluggish flow in the LAD suggestive of endothelial dysfxn. EF 55-65%.   Migraine    Multiple sclerosis (HCC)    Neuromuscular disorder (HCC)    PMR (polymyalgia rheumatica) (HCC)    Pulmonary nodule    a. 03/2018 CTA Chest: 19m small spiculated nodular density @ the L lung apex. Rec f/u in 3 mos.    Past Surgical History:  Procedure Laterality Date   BREAST EXCISIONAL BIOPSY     BREAST SURGERY  1990   COLONOSCOPY WITH PROPOFOL N/A 05/19/2015   Procedure: COLONOSCOPY WITH PROPOFOL;  Surgeon: PHulen Luster  MD;  [facility];  Service: Gastroenterology;  Laterality: N/A;   EYE SURGERY     1978 1st of 3 eye surgeries   LEFT HEART CATH AND CORONARY ANGIOGRAPHY N/A 04/25/2018   Procedure: LEFT HEART CATH AND CORONARY ANGIOGRAPHY;  Surgeon: AWellington Hampshire MD;  [facility];  Service: Cardiovascular;  Laterality: N/A;   TENDON REPAIR       Current Outpatient Medications:    albuterol (VENTOLIN HFA) 108 (90 Base) MCG/ACT inhaler, Inhale 1-2 puffs into the lungs every 6 (six) hours as needed for wheezing or shortness of breath., Disp: 1 g, Rfl: 1   AUBAGIO 14 MG TABS, Take 1 tablet by mouth once per day, Disp: 30 tablet, Rfl: 5   benzonatate (TESSALON PERLES) 100 MG capsule, Take 1 capsule (100 mg total) by mouth 3 (three) times daily as needed., Disp: 20 capsule, Rfl: 0   calcium carbonate (OS-CAL - DOSED IN MG OF ELEMENTAL CALCIUM) 1250 (500 Ca) MG tablet, Take 1 tablet by mouth daily., Disp: , Rfl:    cholecalciferol (VITAMIN D) 1000 units tablet, Take 2,000 Units by mouth daily., Disp: , Rfl:    cyanocobalamin 500 MCG tablet, Take 500 mcg by mouth daily., Disp: , Rfl:    escitalopram (LEXAPRO) 10 MG tablet, TAKE 1 TABLET (10 MG TOTAL) BY MOUTH DAILY. FOR DEPRESSION, Disp: 90 tablet, Rfl: 2   furosemide (LASIX) 20 MG tablet, TAKE 1 TABLET BY MOUTH EVERY DAY, Disp: 90 tablet, Rfl: 3   gabapentin (NEURONTIN) 100  MG capsule, TAKE 1 CAPSULE BY MOUTH THREE TIMES A DAY, Disp: 270 capsule, Rfl: 2   Magnesium 250 MG TABS, Take by mouth., Disp: , Rfl:    nirmatrelvir/ritonavir EUA (PAXLOVID) TABS, Take 3 tablets by mouth 2 (two) times daily for 5 days. (Take nirmatrelvir 150 mg two tablets twice daily for 5 days and ritonavir 100 mg one tablet twice daily for 5 days) Patient GFR is >80, Disp: 30 tablet, Rfl: 0   zolpidem (AMBIEN) 10 MG tablet, Take 0.5-1 tablets (5-10 mg total) by mouth at bedtime as needed for sleep., Disp: 90 tablet, Rfl: 0 Takes only a half dose of ambien at  night  EXAM:  VITALS per patient if applicable:  GENERAL: alert, oriented, appears well and in no acute distress  HEENT: atraumatic, conjunttiva clear, no obvious abnormalities on inspection of external nose and ears  NECK: normal movements of the head and neck  LUNGS: on inspection no signs of respiratory distress, breathing rate appears normal, no obvious gross SOB, gasping or wheezing  CV: no obvious cyanosis  MS: moves all visible extremities without noticeable abnormality  PSYCH/NEURO: pleasant and cooperative, no obvious depression or anxiety, speech and thought processing grossly intact  ASSESSMENT AND PLAN:  Discussed the following assessment and plan:  COVID-19   Discussed treatment options, ideal treatment window, potential complications, isolation and precautions for COVID-19.  After lengthy discussion, the patient opted for treatment with Paxlovid due to being higher risk for complications of covid or severe disease and other factors. Discussed EUA status of this drug and the fact that there is preliminary limited knowledge of risks/interactions/side effects per EUA document vs possible benefits and precautions. She takes a half tablet of ambien which potentially interacts with Paxlovid. She has opted to take 1/2 of this dose while on the paxlovid and monitor herself for increased sedation. This information was shared with patient during the visit and also was provided in patient instructions. Also, advised that patient discuss risks/interactions and use with pharmacist/treatment team as well. The patient did want a prescription for cough, Tessalon Rx sent.  Other symptomatic care measures summarized in patient instructions. Advised to seek prompt in person care if worsening, new symptoms arise, or if is not improving with treatment. Discussed options for inperson care if PCP office not available. Did let this patient know that I only do telemedicine on Tuesdays and Thursdays  for [place]. Advised to schedule follow up visit with PCP or [facility] if any further questions or concerns to avoid delays in care.   I discussed the assessment and treatment plan with the patient. The patient was provided an opportunity to ask questions and all were answered. The patient agreed with the plan and demonstrated an understanding of the instructions.     Lucretia Kern, DO

## 2021-04-27 NOTE — Telephone Encounter (Signed)
Pt called back because she wanted an appt to get medication for covid +.  Pt linked her results in [place] so provider can see covid +. PCP had no appts so virtual visit scheduled with Dr. Maudie Mercury.  FYI to PCP

## 2021-04-27 NOTE — Telephone Encounter (Signed)
[facility] Night - Client Nonclinical Telephone Record AccessNurse Client [facility] Night - Client Client Site [place] Physician Alma Friendly - NP Contact Type Call Who Is Calling Patient / Member / Family / Caregiver Caller Name [place] Phone Number [phone] Patient Name Morgan Herrera Patient DOB [birth year] Call Type Message Only Information Provided Reason for Call Request for General Office Information Initial Comment Caller sent message just got back from vacation and tested positive for Covid with PCR test. She is wanting medication. Additional Comment Triage refused. Caller needs a call back. Office hours provided. Disp. Time Disposition Final User 04/27/2021 8:00:26 AM General Information Provided Yes Marshell Garfinkel Call Closed By: Marshell Garfinkel Transaction Date/Time: 04/27/2021 7:56:39 AM (ET)

## 2021-04-27 NOTE — Patient Instructions (Addendum)
HOME CARE TIPS:  -I sent the medication(s) we discussed to your pharmacy: Meds ordered this encounter  Medications   nirmatrelvir/ritonavir EUA (PAXLOVID) TABS    Sig: Take 3 tablets by mouth 2 (two) times daily for 5 days. (Take nirmatrelvir 150 mg two tablets twice daily for 5 days and ritonavir 100 mg one tablet twice daily for 5 days) Patient GFR is >80    Dispense:  30 tablet    Refill:  0   benzonatate (TESSALON PERLES) 100 MG capsule    Sig: Take 1 capsule (100 mg total) by mouth 3 (three) times daily as needed.    Dispense:  20 capsule    Refill:  0     -I sent in the [place] treatment or referral you requested per our discussion. Please see the information provided below and discuss further with the pharmacist/treatment team.  Please decrease your dose of Ambien to one half dose nightly while taking the Paxlovid.  Please monitor for signs of increased sedation and contact your physician if you have any questions or concerns or increased sedation.  -can use tylenol  if needed for fevers, aches and pains per instructions  -can use nasal saline a few times per day if you have nasal congestion  -stay hydrated, drink plenty of fluids and eat small healthy meals - avoid dairy  -can take 1000 IU (64mg) Vit D3 and 100-500 mg of Vit C daily per instructions  -check out the CDC website for more information on home care, transmission and treatment for COVID19  -follow up with your doctor in 2-3 days unless improving and feeling better  -stay home while sick, except to seek medical care. If you have COVID19, ideally it would be best to stay home for a full 10 days since the onset of symptoms PLUS one day of no fever and feeling better. Wear a good mask that fits snugly (such as N95 or KN95) if around others to reduce the risk of transmission.  It was nice to meet you today, and I really hope you are feeling better soon. I help [place] out with telemedicine visits on Tuesdays and  Thursdays and am available for visits on those days. If you have any concerns or questions following this visit please schedule a follow up visit with your Primary Care doctor or seek care at a local urgent care clinic to avoid delays in care.    Seek in person care or schedule a follow up video visit promptly if your symptoms worsen, new concerns arise or you are not improving with treatment. Call 911 and/or seek emergency care if your symptoms are severe or life threatening.  FACT SHEET FOR PATIENTS, PARENTS, AND CAREGIVERS EMERGENCY USE AUTHORIZATION (EUA) OF PAXLOVID FOR CORONAVIRUS DISEASE 2019 (COVID-19) You are being given this Fact Sheet because your healthcare provider believes it is necessary to provide you with PAXLOVID for the treatment of mild-to-moderate coronavirus disease (COVID-19) caused by the SARS-CoV-2 virus. This Fact Sheet contains information to help you understand the risks and benefits of taking the PAXLOVID you have received or may receive. The U.S. Food and Drug Administration (FDA) has issued an Emergency Use Authorization (EUA) to make PAXLOVID available during the COVID-19 pandemic (for more details about an EUA please see "What is an Emergency Use Authorization?" at the end of this document). PAXLOVID is not an FDA-approved medicine in the [place] Read this Fact Sheet for information about PAXLOVID. Talk to your healthcare provider about your options or  if you have any questions. It is your choice to take PAXLOVID.  What is COVID-19? COVID-19 is caused by a virus called a coronavirus. You can get COVID-19 through close contact with another person who has the virus. COVID-19 illnesses have ranged from very mild-to-severe, including illness resulting in death. While information so far suggests that most COVID-19 illness is mild, serious illness can happen and may cause some of your other medical conditions to become worse. Older people and people of all  ages with severe, long lasting (chronic) medical conditions like heart disease, lung disease, and diabetes, for example seem to be at higher risk of being hospitalized for COVID-19.  What is PAXLOVID? PAXLOVID is an investigational medicine used to treat mild-to-moderate COVID-19 in adults and children [33 years of age and older weighing at least 71 pounds (75 kg)] with positive results of direct SARS-CoV-2 viral testing, and who are at high risk for progression to severe COVID-19, including hospitalization or death. PAXLOVID is investigational because it is still being studied. There is limited information about the safety and effectiveness of using PAXLOVID to treat people with mild-to-moderate COVID-19.  The FDA has authorized the emergency use of PAXLOVID for the treatment of mild-tomoderate COVID-19 in adults and children [43 years of age and older weighing at least 86 pounds (38 kg)] with a positive test for the virus that causes COVID-19, and who are at high risk for progression to severe COVID-19, including hospitalization or death, under an EUA. 1 Revised: 16 December 2020   What should I tell my healthcare provider before I take PAXLOVID? Tell your healthcare provider if you: ? Have any allergies ? Have liver or kidney disease ? Are pregnant or plan to become pregnant ? Are breastfeeding a child ? Have any serious illnesses  Tell your healthcare provider about all the medicines you take, including prescription and over-the-counter medicines, vitamins, and herbal supplements. Some medicines may interact with PAXLOVID and may cause serious side effects. Keep a list of your medicines to show your healthcare provider and pharmacist when you get a new medicine.  You can ask your healthcare provider or pharmacist for a list of medicines that interact with PAXLOVID. Do not start taking a new medicine without telling your healthcare provider. Your healthcare provider can tell you if  it is safe to take PAXLOVID with other medicines.  Tell your healthcare provider if you are taking combined hormonal contraceptive. PAXLOVID may affect how your birth control pills work. Females who are able to become pregnant should use another effective alternative form of contraception or an additional barrier method of contraception. Talk to your healthcare provider if you have any questions about contraceptive methods that might be right for you.  How do I take PAXLOVID? ? PAXLOVID consists of 2 medicines: nirmatrelvir and ritonavir. o Take 2 pink tablets of nirmatrelvir with 1 white tablet of ritonavir by mouth 2 times each day (in the morning and in the evening) for 5 days. For each dose, take all 3 tablets at the same time. o If you have kidney disease, talk to your healthcare provider. You may need a different dose. ? Swallow the tablets whole. Do not chew, break, or crush the tablets. ? Take PAXLOVID with or without food. ? Do not stop taking PAXLOVID without talking to your healthcare provider, even if you feel better. ? If you miss a dose of PAXLOVID within 8 hours of the time it is usually taken, take it as soon as you  remember. If you miss a dose by more than 8 hours, skip the missed dose and take the next dose at your regular time. Do not take 2 doses of PAXLOVID at the same time. ? If you take too much PAXLOVID, call your healthcare provider or go to the nearest hospital emergency room right away. ? If you are taking a ritonavir- or cobicistat-containing medicine to treat hepatitis C or Human Immunodeficiency Virus (HIV), you should continue to take your medicine as prescribed by your healthcare provider. 2 Revised: 16 December 2020    Talk to your healthcare provider if you do not feel better or if you feel worse after 5 days.  Who should generally not take PAXLOVID? Do not take PAXLOVID if: ? You are allergic to nirmatrelvir, ritonavir, or any of the ingredients in  PAXLOVID. ? You are taking any of the following medicines: o Alfuzosin o Pethidine, propoxyphene o Ranolazine o Amiodarone, dronedarone, flecainide, propafenone, quinidine o Colchicine o Lurasidone, pimozide, clozapine o Dihydroergotamine, ergotamine, methylergonovine o Lovastatin, simvastatin o Sildenafil (Revatio) for pulmonary arterial hypertension (PAH) o Triazolam, oral midazolam o Apalutamide o Carbamazepine, phenobarbital, phenytoin o Rifampin o St. John's Wort (hypericum perforatum) Taking PAXLOVID with these medicines may cause serious or life-threatening side effects or affect how PAXLOVID works.  These are not the only medicines that may cause serious side effects if taken with PAXLOVID. PAXLOVID may increase or decrease the levels of multiple other medicines. It is very important to tell your healthcare provider about all of the medicines you are taking because additional laboratory tests or changes in the dose of your other medicines may be necessary while you are taking PAXLOVID. Your healthcare provider may also tell you about specific symptoms to watch out for that may indicate that you need to stop or decrease the dose of some of your other medicines.  What are the important possible side effects of PAXLOVID? Possible side effects of PAXLOVID are: ? Allergic Reactions. Allergic reactions can happen in people taking PAXLOVID, even after only 1 dose. Stop taking PAXLOVID and call your healthcare provider right away if you get any of the following symptoms of an allergic reaction: o hives o trouble swallowing or breathing o swelling of the mouth, lips, or face o throat tightness o hoarseness 3 Revised: 16 December 2020  o skin rash ? Liver Problems. Tell your healthcare provider right away if you have any of these signs and symptoms of liver problems: loss of appetite, yellowing of your skin and the whites of eyes (jaundice), dark-colored urine, pale colored  stools and itchy skin, stomach area (abdominal) pain. ? Resistance to HIV Medicines. If you have untreated HIV infection, PAXLOVID may lead to some HIV medicines not working as well in the future. ? Other possible side effects include: o altered sense of taste o diarrhea o high blood pressure o muscle aches These are not all the possible side effects of PAXLOVID. Not many people have taken PAXLOVID. Serious and unexpected side effects may happen. PAXLOVID is still being studied, so it is possible that all of the risks are not known at this time.  What other treatment choices are there? Veklury (remdesivir) is FDA-approved for the treatment of mild-to-moderate UYQIH-47 in certain adults and children. Talk with your doctor to see if Marijean Heath is appropriate for you. Like PAXLOVID, FDA may also allow for the emergency use of other medicines to treat people with COVID-19. Go to https://price.info/ for information on the emergency use of other medicines  that are authorized by FDA to treat people with COVID-19. Your healthcare provider may talk with you about clinical trials for which you may be eligible. It is your choice to be treated or not to be treated with PAXLOVID. Should you decide not to receive it or for your child not to receive it, it will not change your standard medical care.  What if I am pregnant or breastfeeding? There is no experience treating pregnant women or breastfeeding mothers with PAXLOVID. For a mother and unborn baby, the benefit of taking PAXLOVID may be greater than the risk from the treatment. If you are pregnant, discuss your options and specific situation with your healthcare provider. It is recommended that you use effective barrier contraception or do not have sexual activity while taking PAXLOVID. If you are breastfeeding, discuss your options and  specific situation with your healthcare provider. 4 Revised: 16 December 2020   How do I report side effects with PAXLOVID? Contact your healthcare provider if you have any side effects that bother you or do not go away. Report side effects to FDA MedWatch at SmoothHits.hu or call 1-800-FDA1088 or you can report side effects to [employer]. at the contact information provided below. Website Fax number Telephone number www.pfizersafetyreporting.com 8678739558 [phone] How should I store [place]? Store PAXLOVID tablets at room temperature, between 68?F to 77?F (20?C to 25?C). How can I learn more about COVID-19? ? Ask your healthcare provider. ? Visit https://jacobson-johnson.com/. ? Contact your local or state public health department. What is an Emergency Use Authorization (EUA)? The [place] FDA has made PAXLOVID available under an emergency access mechanism called an Emergency Use Authorization (EUA). The EUA is supported by a Education officer, museum and Human Service (HHS) declaration that circumstances exist to justify the emergency use of drugs and biological products during the COVID-19 pandemic. PAXLOVID for the treatment of mild-to-moderate COVID-19 in adults and children [77 years of age and older weighing at least 53 pounds (58 kg)] with positive results of direct SARS-CoV-2 viral testing, and who are at high risk for progression to severe COVID-19, including hospitalization or death, has not undergone the same type of review as an FDA-approved product. In issuing an EUA under the EHOZY-24 public health emergency, the FDA has determined, among other things, that based on the total amount of scientific evidence available including data from adequate and well-controlled clinical trials, if available, it is reasonable to believe that the product may be effective for diagnosing, treating, or preventing COVID-19, or a serious or life-threatening disease or condition  caused by COVID-19; that the known and potential benefits of the product, when used to diagnose, treat, or prevent such disease or condition, outweigh the known and potential risks of such product; and that there are no adequate, approved, and available alternatives. All of these criteria must be met to allow for the product to be used in the treatment of patients during the COVID-19 pandemic. The EUA for PAXLOVID is in effect for the duration of the COVID-19 declaration justifying emergency use of this product, unless terminated or revoked (after which the products may no longer be used under the EUA). 5 Revised: 16 December 2020     Additional Information For general questions, visit the website or call the telephone number provided below. Website Telephone number www.COVID19oralRx.com [phone] (1-877-C19-PACK) You can also go to www.pfizermedinfo.com or call ([phone] for more information. [id number] Revised: 16 December 2020

## 2021-04-27 NOTE — Telephone Encounter (Addendum)
I spoke with pt; pt said she feels like she has a bad cold; pt said symptoms started on 04/23/21. pt gets SOB upon exertion. Pt said she has H/A today with pain level of 2-3. UC & ED precautions given and pt voiced understanding; pt is self quarantining, will drink plenty of water, rest and will take tylenol if fever. Pt already has video appt with  Dr Maudie Mercury today at 11:40. Sending note to Gentry Fitz NP as Juluis Rainier and Dr Maudie Mercury.

## 2021-04-28 ENCOUNTER — Other Ambulatory Visit: Payer: Self-pay | Admitting: Primary Care

## 2021-04-28 DIAGNOSIS — G4700 Insomnia, unspecified: Secondary | ICD-10-CM

## 2021-05-02 ENCOUNTER — Telehealth: Payer: Self-pay | Admitting: Primary Care

## 2021-05-02 MED ORDER — BENZONATATE 100 MG PO CAPS: 100.0000 mg | ORAL_CAPSULE | Freq: Three times a day (TID) | ORAL | 0 refills | Status: DC | PRN

## 2021-05-02 NOTE — Telephone Encounter (Signed)
Mrs. Morgan Herrera called in and stated that Dr. Maudie Mercury prescribed benzonatate (TESSALON PERLES) 100 MG capsule and [facility] does not have them. Wanted to know if the prescription can be changed to   [facility] : Address: [place], [place], [place] [address] [facility] : [phone]. I called the pharmacy and they have it in stock.

## 2021-05-02 NOTE — Telephone Encounter (Signed)
Rx done. 

## 2021-05-04 ENCOUNTER — Encounter: Payer: Self-pay | Admitting: Family Medicine

## 2021-05-04 ENCOUNTER — Telehealth (INDEPENDENT_AMBULATORY_CARE_PROVIDER_SITE_OTHER): Payer: PPO | Admitting: Family Medicine

## 2021-05-04 VITALS — BP 124/55 | HR 56 | Temp 97.4°F | Wt 123.0 lb

## 2021-05-04 DIAGNOSIS — J0140 Acute pansinusitis, unspecified: Secondary | ICD-10-CM | POA: Diagnosis not present

## 2021-05-04 MED ORDER — AMOXICILLIN-POT CLAVULANATE 875-125 MG PO TABS: 1.0000 | ORAL_TABLET | Freq: Two times a day (BID) | ORAL | 0 refills | Status: AC

## 2021-05-04 NOTE — Progress Notes (Signed)
I connected with Morgan Herrera on 05/04/21 at 12:20 PM EDT by video and verified that I am speaking with the correct person using two identifiers.   I discussed the limitations, risks, security and privacy concerns of performing an evaluation and management service by video and the availability of in person appointments. I also discussed with the patient that there may be a patient responsible charge related to this service. The patient expressed understanding and agreed to proceed.  Patient location: Home Provider Location: [place] Participants: Morgan Herrera and Jeanni Barratt Stlaurent   Subjective:     Morgan Herrera is a 70 y.o. female presenting for Sore Throat, Headache, Sinusitis, and Cough     Sore Throat  Associated symptoms include congestion, coughing, diarrhea, ear pain and headaches. Pertinent negatives include no vomiting.  Headache  Associated symptoms include coughing, ear pain and a sore throat. Pertinent negatives include no fever, nausea or vomiting.  Sinusitis Associated symptoms include congestion, coughing, ear pain, headaches, sneezing and a sore throat. Pertinent negatives include no chills.  Cough Associated symptoms include ear pain, headaches, myalgias and a sore throat. Pertinent negatives include no chills or fever.   #Covid-19 - tested positive last week - had a virtual visit - could not tolerate paxlovid due to stomach upset - husband tested positive last weekend - did feel she was getting better - then the last 1-2 days  - initial symptoms started on 04/23/2021  Review of Systems  Constitutional:  Negative for chills and fever.  HENT:  Positive for congestion, ear pain, sinus pain, sneezing and sore throat.   Respiratory:  Positive for cough.   Gastrointestinal:  Positive for diarrhea. Negative for nausea and vomiting.  Musculoskeletal:  Positive for myalgias. Negative for arthralgias.  Neurological:  Positive for headaches.     Social History   Tobacco Use  Smoking Status Former  Smokeless Tobacco Never        Objective:   BP Readings from Last 3 Encounters:  05/04/21 (!) 124/55  04/27/21 117/70  02/02/21 118/64   Wt Readings from Last 3 Encounters:  05/04/21 123 lb (55.8 kg)  04/27/21 121 lb 12.8 oz (55.2 kg)  02/02/21 125 lb (56.7 kg)    BP (!) 124/55   Pulse (!) 56   Temp (!) 97.4 F (36.3 C)   Wt 123 lb (55.8 kg)   BMI 19.85 kg/m   Physical Exam Constitutional:      General: She is not in acute distress.    Appearance: She is well-developed. She is not diaphoretic.  HENT:     Right Ear: External ear normal.     Left Ear: External ear normal.     Nose: Nose normal.  Eyes:     Conjunctiva/sclera: Conjunctivae normal.  Cardiovascular:     Rate and Rhythm: Normal rate.  Pulmonary:     Effort: Pulmonary effort is normal.  Musculoskeletal:     Cervical back: Neck supple.  Skin:    General: Skin is warm and dry.     Capillary Refill: Capillary refill takes less than 2 seconds.  Neurological:     Mental Status: She is alert. Mental status is at baseline.  Psychiatric:        Mood and Affect: Mood normal.        Behavior: Behavior normal.           Assessment & Plan:   Problem List Items Addressed This Visit   None  Visit Diagnoses     Acute non-recurrent pansinusitis    -  Primary   Relevant Medications   amoxicillin-clavulanate (AUGMENTIN) 875-125 MG tablet      10+ days out from covid with initial improvement and now worsening.  Abx Saline rinse/spray   No follow-ups on file.  Morgan Noe, MD

## 2021-07-14 ENCOUNTER — Ambulatory Visit
Admission: RE | Admit: 2021-07-14 | Discharge: 2021-07-14 | Disposition: A | Discharge status: Home / Self Care | Payer: PPO | Source: Ambulatory Visit | Attending: Primary Care | Admitting: Primary Care

## 2021-07-14 ENCOUNTER — Other Ambulatory Visit: Payer: Self-pay

## 2021-07-14 DIAGNOSIS — Z780 Asymptomatic menopausal state: Secondary | ICD-10-CM | POA: Diagnosis not present

## 2021-07-14 DIAGNOSIS — M8589 Other specified disorders of bone density and structure, multiple sites: Secondary | ICD-10-CM | POA: Diagnosis not present

## 2021-07-14 DIAGNOSIS — E2839 Other primary ovarian failure: Secondary | ICD-10-CM

## 2021-08-02 DIAGNOSIS — H59811 Chorioretinal scars after surgery for detachment, right eye: Secondary | ICD-10-CM | POA: Diagnosis not present

## 2021-08-02 DIAGNOSIS — H3540 Unspecified peripheral retinal degeneration: Secondary | ICD-10-CM | POA: Diagnosis not present

## 2021-08-02 DIAGNOSIS — Z961 Presence of intraocular lens: Secondary | ICD-10-CM | POA: Diagnosis not present

## 2021-08-02 DIAGNOSIS — H2512 Age-related nuclear cataract, left eye: Secondary | ICD-10-CM | POA: Diagnosis not present

## 2021-08-07 ENCOUNTER — Other Ambulatory Visit: Payer: Self-pay

## 2021-08-07 ENCOUNTER — Other Ambulatory Visit: Payer: PPO

## 2021-08-07 DIAGNOSIS — G35 Multiple sclerosis: Secondary | ICD-10-CM

## 2021-08-07 LAB — VITAMIN D 25 HYDROXY (VIT D DEFICIENCY, FRACTURES): VITD: 80.96 ng/mL (ref 30.00–100.00)

## 2021-08-07 LAB — CBC WITH DIFFERENTIAL/PLATELET
Basophils Absolute: 0.1 10*3/uL (ref 0.0–0.1)
Basophils Relative: 2.6 % (ref 0.0–3.0)
Eosinophils Absolute: 0.1 10*3/uL (ref 0.0–0.7)
Eosinophils Relative: 3.6 % (ref 0.0–5.0)
HCT: 39.8 % (ref 36.0–46.0)
Hemoglobin: 13 g/dL (ref 12.0–15.0)
Lymphocytes Relative: 25.3 % (ref 12.0–46.0)
Lymphs Abs: 1 10*3/uL (ref 0.7–4.0)
MCHC: 32.6 g/dL (ref 30.0–36.0)
MCV: 86.3 fl (ref 78.0–100.0)
Monocytes Absolute: 0.6 10*3/uL (ref 0.1–1.0)
Monocytes Relative: 15.2 % — ABNORMAL HIGH (ref 3.0–12.0)
Neutro Abs: 2.2 10*3/uL (ref 1.4–7.7)
Neutrophils Relative %: 53.3 % (ref 43.0–77.0)
Platelets: 234 10*3/uL (ref 150.0–400.0)
RBC: 4.61 Mil/uL (ref 3.87–5.11)
RDW: 14.2 % (ref 11.5–15.5)
WBC: 4 10*3/uL (ref 4.0–10.5)

## 2021-08-07 LAB — COMPREHENSIVE METABOLIC PANEL
ALT: 21 U/L (ref 0–35)
AST: 24 U/L (ref 0–37)
Albumin: 4.6 g/dL (ref 3.5–5.2)
Alkaline Phosphatase: 50 U/L (ref 39–117)
BUN: 22 mg/dL (ref 6–23)
CO2: 30 mEq/L (ref 19–32)
Calcium: 9.7 mg/dL (ref 8.4–10.5)
Chloride: 102 mEq/L (ref 96–112)
Creatinine, Ser: 0.81 mg/dL (ref 0.40–1.20)
GFR: 73.82 mL/min (ref >60.00)
Glucose, Bld: 97 mg/dL (ref 70–99)
Potassium: 4.7 mEq/L (ref 3.5–5.1)
Sodium: 140 mEq/L (ref 135–145)
Total Bilirubin: 0.7 mg/dL (ref 0.2–1.2)
Total Protein: 6.7 g/dL (ref 6.0–8.3)

## 2021-08-08 NOTE — Progress Notes (Addendum)
NEUROLOGY FOLLOW UP OFFICE NOTE  Morgan Herrera [id number]  Assessment/Plan:   Multiple sclerosis - stable clinically and on imaging.  Based on age and stability, will plan to discontinue DMT  Check another MRI of brain with and without contrast Following MRI and review, plan to discontinue Aubagio. Continue D3 4000 IU daily Continue gabapentin Follow up 6 months.  Subjective:  Morgan Herrera is a 70 year old right-handed woman with multiple sclerosis, polymyalgia rheumatica and migraine who follows up for multiple sclerosis.   UPDATE: Current medications:  Aubagio, B12 1053mcg daily, gabapentin 100mg  TID PRN; Lexapro 10mg  daily; Ambien; D3 4000 IU daily.     08/07/2021 LABS:  CBC with WBC 4, HGB 13, HCT 39.8, ALC 1; CMP with Na 140, K 4.7, Cl 102, CO2 30, glucose 97, BUN 22, Cr 0.81, t bili 0.7, ALP 50, AST 24, ALT 21; D 80.96   Vision: Chronic right sided peripheral vision loss.  Over last 2 years, she reports the central vision in her right eye is dimmer but visual field unchanged.  Eye exam reveals no active optic neuritis.   Motor:  No concerns Sensory:  fingers feel tingling and itchiness.   Pain:  Neck and shoulder pain/aches.  Takes magnesium for cramps in feet, which help. Gait:  Sometimes may feel briefly off balance.   Bowel/Bladder:  No concerns Fatigue:  Sometimes has episodes of lethargy. Cognition:  No concerns Mood:  Some stress.   Tension-type headaches: They are mild to moderate occipital/biptemporal pressure headaches occurring 3 to 4 days and resolves for a couple of days.  Treats with Aleve or Tylenol (only takes one day a week).  At one time, stabbing pain in back of right ear which resolved.      HISTORY: She was diagnosed with MS at age 37, when she presented with bilateral optic neuritis, right worse than left.  She was treated with IV steroids at that time..  In hindsight, she recalls brief episodes from her past.  In her late-20s, she had a 3 week  episode of dizziness and ataxia.  She had brief episodes of limb paresthesias lasting a couple of days.   Work up included MRIs.  MRIs of the brain have shown lesions suspicious for MS.  Prior cervical MRI reportedly normal.   She never had a relapse and therefore never required further IV steroids.  However, she was diagnosed with polymyalgia rheumatica several years ago, after experiencing severe joint pain and stiffness.     She occasionally has fatigue once in a while, sometimes lasting for 2 weeks.   She does not exhibit Lhermitte's sign.  She reports burning and aching on the bottom of her feet when she walks for prolonged period.   She works part-time as a Radiation protection practitioner.  For the past couple of years, she reports word-finding difficulty.  Sometimes it is difficult to concentrate.  It has almost affected her work at times.  She has a BA in [place].  She underwent neuropsychological testing in July 2017, which revealed anxiety and depression but no cognitive impairment.   Prior disease modifying drugs:  Avonex (tired of injections), Tecfidera (nausea, flushing and unable to take aspirin)   09/06/2015:  MRI BRAIN W WO: chronic non-enhancing T2/FLAIR hyperintensities in the periventricular white matter, some demonstrating Dawson's fingers, with mild corpus callosum involvement, which appears stable compared to 2012 and similar to imaging from 2005.   10/09/2016:  MRI BRAIN W WO:  stable. 12/28/2017:  MRI BRAIN  W WO:  stable compared to prior imaging from 2016. 04/07/2020 MRI BRAIN W WO:  Stable mild white matter disease when compared with 2019. 04/07/2020 MRI CERVICAL SPINE W WO:  1.  Normal MRI of the cord.  2.  Cervical spine degeneration with foraminal narrowings as described.  Diffusely patent spinal canal.  PAST MEDICAL HISTORY: Past Medical History:  Diagnosis Date   (HFpEF) heart failure with preserved ejection fraction ([place])    a. 03/2018 Echo: EF 55-60%, no rwma. Mod MR. Nl PASP; b.  03/2019 Echo: EF 60-65%, ? DD, mod MR.   Chest pain    a. 03/2018 Ex MV: rest only imaging showed ant wall perfusion defect. No stress imaging as pt developed hypotension w/ exercise; b. 05/2018 Cath: Nl cors w/ sluggish flow in the LAD suggestive of endothelial dysfxn. EF 55-65%.   Migraine    Multiple sclerosis (HCC)    Neuromuscular disorder (HCC)    PMR (polymyalgia rheumatica) (HCC)    Pulmonary nodule    a. 03/2018 CTA Chest: 72mm small spiculated nodular density @ the L lung apex. Rec f/u in 3 mos.    MEDICATIONS: Current Outpatient Medications on File Prior to Visit  Medication Sig Dispense Refill   albuterol (VENTOLIN HFA) 108 (90 Base) MCG/ACT inhaler Inhale 1-2 puffs into the lungs every 6 (six) hours as needed for wheezing or shortness of breath. 1 g 1   AUBAGIO 14 MG TABS Take 1 tablet by mouth once per day 30 tablet 5   benzonatate (TESSALON PERLES) 100 MG capsule Take 1 capsule (100 mg total) by mouth 3 (three) times daily as needed. 20 capsule 0   calcium carbonate (OS-CAL - DOSED IN MG OF ELEMENTAL CALCIUM) 1250 (500 Ca) MG tablet Take 1 tablet by mouth daily.     cholecalciferol (VITAMIN D) 1000 units tablet Take 2,000 Units by mouth daily.     cyanocobalamin 500 MCG tablet Take 500 mcg by mouth daily.     escitalopram (LEXAPRO) 10 MG tablet TAKE 1 TABLET (10 MG TOTAL) BY MOUTH DAILY. FOR DEPRESSION 90 tablet 2   furosemide (LASIX) 20 MG tablet TAKE 1 TABLET BY MOUTH EVERY DAY 90 tablet 3   gabapentin (NEURONTIN) 100 MG capsule TAKE 1 CAPSULE BY MOUTH THREE TIMES A DAY 270 capsule 2   Magnesium 250 MG TABS Take by mouth.     zolpidem (AMBIEN) 10 MG tablet TAKE 1/2-1 TABLETS (5-10 MG TOTAL) BY MOUTH AT BEDTIME AS NEEDED FOR SLEEP. 90 tablet 0   No current facility-administered medications on file prior to visit.    ALLERGIES: No Known Allergies  FAMILY HISTORY: Family History  Problem Relation Age of Onset   Heart disease Mother    Hypertension Mother    Parkinson's  disease Mother    Parkinsonism Mother    Cancer Maternal Grandmother        breast   Breast cancer Maternal Grandmother    Asthma Father       Objective:  Blood pressure 127/73, pulse 67, height 5\' 6"  (1.676 m), weight 124 lb 6.4 oz (56.4 kg), SpO2 96 %. General: No acute distress.  Patient appears well-groomed.   Head:  Normocephalic/atraumatic Eyes:  Fundi examined but not visualized Neck: supple, no paraspinal tenderness, full range of motion Heart:  Regular rate and rhythm Lungs:  Clear to auscultation bilaterally Back: No paraspinal tenderness Neurological Exam: alert and oriented to person, place, and time.  Speech fluent and not dysarthric, language intact.  Vision loss  in right eye.  Right APD.  Otherwise, CN II-XII intact. Bulk and tone normal, muscle strength 5/5 throughout.  Sensation to pinprick and vibration intact.  Deep tendon reflexes 2+ throughout, toes downgoing.  Finger to nose testing intact.  Gait normal, Romberg negative.   Metta Clines, DO  CC: Alma Friendly, NP

## 2021-08-09 ENCOUNTER — Encounter: Payer: Self-pay | Admitting: Neurology

## 2021-08-09 ENCOUNTER — Ambulatory Visit: Payer: PPO | Admitting: Neurology

## 2021-08-09 ENCOUNTER — Other Ambulatory Visit: Payer: Self-pay

## 2021-08-09 VITALS — BP 127/73 | HR 67 | Ht 66.0 in | Wt 124.4 lb

## 2021-08-09 DIAGNOSIS — G35 Multiple sclerosis: Secondary | ICD-10-CM | POA: Diagnosis not present

## 2021-08-09 NOTE — Patient Instructions (Addendum)
Continue Aubagio for now, as well as vitamin D3 and gabapentin Repeat MRI of brain with and without contrast now.  After review will let you know to stop Aubagio Follow up 6 months.

## 2021-08-29 ENCOUNTER — Other Ambulatory Visit: Payer: PPO

## 2021-09-01 ENCOUNTER — Other Ambulatory Visit: Payer: Self-pay | Admitting: Neurology

## 2021-09-05 ENCOUNTER — Ambulatory Visit
Admission: RE | Admit: 2021-09-05 | Discharge: 2021-09-05 | Disposition: A | Discharge status: Home / Self Care | Payer: PPO | Source: Ambulatory Visit | Attending: Neurology | Admitting: Neurology

## 2021-09-05 ENCOUNTER — Other Ambulatory Visit: Payer: Self-pay

## 2021-09-05 DIAGNOSIS — G35 Multiple sclerosis: Secondary | ICD-10-CM | POA: Diagnosis not present

## 2021-09-05 MED ORDER — GADOBENATE DIMEGLUMINE 529 MG/ML IV SOLN
11.0000 mL | Freq: Once | INTRAVENOUS | Status: AC | PRN
  Administered 2021-09-05: 11 mL via INTRAVENOUS

## 2021-09-06 ENCOUNTER — Encounter: Payer: Self-pay | Admitting: Neurology

## 2021-09-06 NOTE — Progress Notes (Signed)
Pt advised of her MRI Results. And discontinue [place].

## 2021-09-26 ENCOUNTER — Telehealth: Payer: Self-pay

## 2021-09-26 NOTE — Telephone Encounter (Signed)
Telephone call from [facility] to see if pt discontinue the Aubagio,  Advised Rep yes she stop taking the medication as instructed at her last visit 11//9/22.

## 2021-10-31 ENCOUNTER — Ambulatory Visit: Payer: PPO

## 2021-11-03 ENCOUNTER — Other Ambulatory Visit: Payer: Self-pay | Admitting: Primary Care

## 2021-11-03 DIAGNOSIS — G4700 Insomnia, unspecified: Secondary | ICD-10-CM

## 2021-11-03 MED ORDER — ZOLPIDEM TARTRATE 10 MG PO TABS: 10.0000 mg | ORAL_TABLET | Freq: Every evening | ORAL | 0 refills | Status: DC | PRN

## 2021-11-08 ENCOUNTER — Ambulatory Visit: Payer: PPO

## 2021-11-17 NOTE — Progress Notes (Addendum)
Annual Wellness visit completed on 11/21/21 by Kirke Shaggy LPN

## 2021-11-21 ENCOUNTER — Ambulatory Visit (INDEPENDENT_AMBULATORY_CARE_PROVIDER_SITE_OTHER): Payer: PPO

## 2021-11-21 DIAGNOSIS — Z0000 Encounter for general adult medical examination without abnormal findings: Secondary | ICD-10-CM

## 2021-11-21 NOTE — Progress Notes (Signed)
Virtual Visit via Telephone Note  I connected with  Morgan Herrera on 11/21/21 at  8:15 AM EST by telephone and verified that I am speaking with the correct person using two identifiers.  Location: Patient: home Provider: [facility] Persons participating in the virtual visit: [place]   I discussed the limitations, risks, security and privacy concerns of performing an evaluation and management service by telephone and the availability of in person appointments. The patient expressed understanding and agreed to proceed.  Interactive audio and video telecommunications were attempted between this nurse and patient, however failed, due to patient having technical difficulties OR patient did not have access to video capability.  We continued and completed visit with audio only.  Some vital signs may be absent or patient reported.   Morgan David, LPN  Subjective:   [name] is a 71 y.o. female who presents for Medicare Annual (Subsequent) preventive examination.  Review of Systems           Objective:    There were no vitals filed for this visit. There is no height or weight on file to calculate BMI.  Advanced Directives 08/09/2021 08/02/2020 03/03/2020 02/23/2020 01/28/2020 07/24/2019 01/19/2019  Does Patient Have a Medical Advance Directive? Yes Yes No No Yes Yes Yes  Type of Paramedic of [place];Living will - - - - Living will;Healthcare Power of [place];Living will  Does patient want to make changes to medical advance directive? - - - - - - No - Patient declined  Copy of [place] in Chart? - - - - - - No - copy requested  Would patient like information on creating a medical advance directive? - - - No - Patient declined - - -    Current Medications (verified) Outpatient Encounter Medications as of 11/21/2021  Medication Sig   albuterol (VENTOLIN HFA) 108 (90 Base) MCG/ACT inhaler  Inhale 1-2 puffs into the lungs every 6 (six) hours as needed for wheezing or shortness of breath.   AUBAGIO 14 MG TABS Take 1 tablet by mouth once per day   calcium carbonate (OS-CAL - DOSED IN MG OF ELEMENTAL CALCIUM) 1250 (500 Ca) MG tablet Take 1 tablet by mouth daily.   cholecalciferol (VITAMIN D) 1000 units tablet Take 2,000 Units by mouth daily.   cyanocobalamin 500 MCG tablet Take 500 mcg by mouth daily.   escitalopram (LEXAPRO) 10 MG tablet TAKE 1 TABLET (10 MG TOTAL) BY MOUTH DAILY. FOR DEPRESSION   furosemide (LASIX) 20 MG tablet TAKE 1 TABLET BY MOUTH EVERY DAY   gabapentin (NEURONTIN) 100 MG capsule TAKE 1 CAPSULE BY MOUTH THREE TIMES A DAY   Magnesium 250 MG TABS Take by mouth.   zolpidem (AMBIEN) 10 MG tablet Take 1 tablet (10 mg total) by mouth at bedtime as needed for sleep.   No facility-administered encounter medications on file as of 11/21/2021.    Allergies (verified) Patient has no known allergies.   History: Past Medical History:  Diagnosis Date   (HFpEF) heart failure with preserved ejection fraction ([place])    a. 03/2018 Echo: EF 55-60%, no rwma. Mod MR. Nl PASP; b. 03/2019 Echo: EF 60-65%, ? DD, mod MR.   Chest pain    a. 03/2018 Ex MV: rest only imaging showed ant wall perfusion defect. No stress imaging as pt developed hypotension w/ exercise; b. 05/2018 Cath: Nl cors w/ sluggish flow in the LAD suggestive of endothelial dysfxn. EF 55-65%.  Migraine    Multiple sclerosis (HCC)    Neuromuscular disorder (HCC)    PMR (polymyalgia rheumatica) (HCC)    Pulmonary nodule    a. 03/2018 CTA Chest: 56mm small spiculated nodular density @ the L lung apex. Rec f/u in 3 mos.   Past Surgical History:  Procedure Laterality Date   BREAST EXCISIONAL BIOPSY     BREAST SURGERY  1990   COLONOSCOPY WITH PROPOFOL N/A 05/19/2015   Procedure: COLONOSCOPY WITH PROPOFOL;  Surgeon: Hulen Luster, MD;  Location: [facility] ENDOSCOPY;  Service: Gastroenterology;  Laterality: N/A;   EYE SURGERY      1978 1st of 3 eye surgeries   LEFT HEART CATH AND CORONARY ANGIOGRAPHY N/A 04/25/2018   Procedure: LEFT HEART CATH AND CORONARY ANGIOGRAPHY;  Surgeon: Wellington Hampshire, MD;  Location: [facility];  Service: Cardiovascular;  Laterality: N/A;   TENDON REPAIR     Family History  Problem Relation Age of Onset   Heart disease Mother    Hypertension Mother    Parkinson's disease Mother    Parkinsonism Mother    Cancer Maternal Grandmother        breast   Breast cancer Maternal Grandmother    Asthma Father    Social History   Socioeconomic History   Marital status: Married    Spouse name: Not on file   Number of children: 1   Years of education: Not on file   Highest education level: Bachelor's degree (e.g., BA, AB, BS)  Occupational History   Not on file  Tobacco Use   Smoking status: Former   Smokeless tobacco: Never  Vaping Use   Vaping Use: Never used  Substance and Sexual Activity   Alcohol use: Yes    Alcohol/week: 0.0 standard drinks    Comment: 1 to 2 glass of wine at least 4 nights a week   Drug use: No   Sexual activity: Not on file  Other Topics Concern   Not on file  Social History Narrative   Married.   Moved here from [place] after 35 years   Enjoys arts and crafts. Taking a painting class.   Walking with her spouse.    Reading, shopping.   Right handed   Social Determinants of Health   Financial Resource Strain: Not on file  Food Insecurity: Not on file  Transportation Needs: Not on file  Physical Activity: Not on file  Stress: Not on file  Social Connections: Not on file    Tobacco Counseling Counseling given: Not Answered   Clinical Intake:  Pre-visit preparation completed: Yes  Pain : No/denies pain     Nutritional Risks: None Diabetes: No  How often do you need to have someone help you when you read instructions, pamphlets, or other written materials from your doctor or pharmacy?: 1 - Never  Diabetic?no  Interpreter  Needed?: No  Information entered by :: Kirke Shaggy, LPN   Activities of Daily Living No flowsheet data found.  Patient Care Team: Pleas Koch, NP as PCP - General (Nurse Practitioner) Wellington Hampshire, MD as PCP - Cardiology (Cardiology) Wellington Hampshire, MD as Consulting Physician (Cardiology) Pieter Partridge, DO as Consulting Physician (Neurology)  Indicate any recent Medical Services you may have received from other than [facility] providers in the past year (date may be approximate).     Assessment:   This is a routine wellness examination for Sallyanne.  Hearing/Vision screen No results found.  Dietary issues and  exercise activities discussed:     Goals Addressed   None    Depression Screen PHQ 2/9 Scores 01/18/2021 09/12/2020 02/23/2020 05/07/2018 04/10/2017  PHQ - 2 Score 0 0 0 0 0  PHQ- 9 Score 0 0 0 - -    Fall Risk Fall Risk  08/09/2021 01/18/2021 08/02/2020 02/23/2020 01/28/2020  Falls in the past year? 0 0 0 0 0  Number falls in past yr: 0 0 0 0 0  Injury with Fall? 0 0 0 0 0  Risk for fall due to : - - - No Fall Risks -  Follow up - - - Falls evaluation completed;Falls prevention discussed -    FALL RISK PREVENTION PERTAINING TO THE HOME:  Any stairs in or around the home? No  If so, are there any without handrails? No  Home free of loose throw rugs in walkways, pet beds, electrical cords, etc? Yes  Adequate lighting in your home to reduce risk of falls? Yes   ASSISTIVE DEVICES UTILIZED TO PREVENT FALLS:  Life alert? No  Use of a cane, walker or w/c? No  Grab bars in the bathroom? Yes  Shower chair or bench in shower? Yes  Elevated toilet seat or a handicapped toilet? No    Cognitive Function: MMSE - Mini Mental State Exam 02/23/2020 12/13/2015  Not completed: Refused -  Orientation to time - 5  Orientation to Place - 4  Registration - 3  Attention/ Calculation - 5  Recall - 3  Language- name 2 objects - 2  Language- repeat - 1  Language- follow  3 step command - 3  Language- read & follow direction - 1  Write a sentence - 1  Copy design - 1  Total score - 29        Immunizations Immunization History  Administered Date(s) Administered   Fluad Quad(high Dose 65+) 06/11/2019   Influenza, High Dose Seasonal PF 06/13/2020, 06/28/2021   Influenza,inj,Quad PF,6+ Mos 06/26/2018   Influenza-Unspecified 06/15/2020   PFIZER Comirnaty(Gray Top)Covid-19 Tri-Sucrose Vaccine 01/02/2021   PFIZER(Purple Top)SARS-COV-2 Vaccination 10/26/2019, 11/16/2019, 06/27/2020   PPD Test 06/15/2016   Pfizer Covid-19 Vaccine Bivalent Booster 10yrs & up 08/15/2021   Pneumococcal Conjugate-13 04/10/2017   Pneumococcal Polysaccharide-23 05/26/2015   Td 11/30/2014   Zoster Recombinat (Shingrix) 07/28/2019, 01/25/2020   Zoster, Live 10/01/2010    TDAP status: Up to date  Flu Vaccine status: Up to date  Pneumococcal vaccine status: Up to date  Covid-19 vaccine status: Completed vaccines  Qualifies for Shingles Vaccine? Yes   Zostavax completed Yes   Shingrix Completed?: Yes  Screening Tests Health Maintenance  Topic Date Due   Pneumonia Vaccine 58+ Years old (85) 05/25/2020   MAMMOGRAM  02/22/2023   TETANUS/TDAP  11/29/2024   COLONOSCOPY (Pts 45-27yrs Insurance coverage will need to be confirmed)  05/18/2025   INFLUENZA VACCINE  Completed   DEXA SCAN  Completed   COVID-19 Vaccine  Completed   Hepatitis C Screening  Completed   Zoster Vaccines- Shingrix  Completed   HPV VACCINES  Aged Out    Health Maintenance  Health Maintenance Due  Topic Date Due   Pneumonia Vaccine 30+ Years old (34) 05/25/2020    Colorectal cancer screening: Type of screening: Colonoscopy. Completed 05/19/15. Repeat every 10 years  Mammogram status: Completed 02/21/21. Repeat every year  Bone Density status: Completed 07/14/21. Results reflect: Bone density results: NORMAL. Repeat every 5 years.  Lung Cancer Screening: (Low Dose CT Chest recommended if Age  71-80  years, 30 pack-year currently smoking OR have quit w/in 15years.) does not qualify.    Additional Screening:  Hepatitis C Screening: does qualify; Completed 03/12/16  Vision Screening: Recommended annual ophthalmology exams for early detection of glaucoma and other disorders of the eye. Is the patient up to date with their annual eye exam?  Yes  Who is the provider or what is the name of the office in which the patient attends annual eye exams? [facility] in [place], [place] If pt is not established with a provider, would they like to be referred to a provider to establish care? No .   Dental Screening: Recommended annual dental exams for proper oral hygiene  Community Resource Referral / Chronic Care Management: CRR required this visit?  No   CCM required this visit?  No      Plan:     I have personally reviewed and noted the following in the patients chart:   Medical and social history Use of alcohol, tobacco or illicit drugs  Current medications and supplements including opioid prescriptions.  Functional ability and status Nutritional status Physical activity Advanced directives List of other physicians Hospitalizations, surgeries, and ER visits in previous 12 months Vitals Screenings to include cognitive, depression, and falls Referrals and appointments  In addition, I have reviewed and discussed with patient certain preventive protocols, quality metrics, and best practice recommendations. A written personalized care plan for preventive services as well as general preventive health recommendations were provided to patient.     Morgan David, LPN   2/99/[id number]   Nurse Notes: no

## 2021-11-21 NOTE — Patient Instructions (Signed)
Morgan Herrera , Thank you for taking time to come for your Medicare Wellness Visit. I appreciate your ongoing commitment to your health goals. Please review the following plan we discussed and let me know if I can assist you in the future.   Screening recommendations/referrals: Colonoscopy: 05/19/15 Mammogram: 02/21/21 Bone Density: 07/14/21 Recommended yearly ophthalmology/optometry visit for glaucoma screening and checkup Recommended yearly dental visit for hygiene and checkup  Vaccinations: Influenza vaccine: 06/28/21 Pneumococcal vaccine: 04/10/17 Tdap vaccine: 11/30/14 Shingles vaccine: Shingrix 07/28/19,   01/25/20   Zostavax 10/01/10 Covid-19:10/25/20, 11/16/19, 06/27/20, 01/02/21, 08/15/21  Advanced directives: yes, copy requested  Conditions/risks identified: none  Next appointment: Follow up in one year for your annual wellness visit 11/22/22 @ 9am by phone   Preventive Care 65 Years and Older, Female Preventive care refers to lifestyle choices and visits with your health care provider that can promote health and wellness. What does preventive care include? A yearly physical exam. This is also called an annual well check. Dental exams once or twice a year. Routine eye exams. Ask your health care provider how often you should have your eyes checked. Personal lifestyle choices, including: Daily care of your teeth and gums. Regular physical activity. Eating a healthy diet. Avoiding tobacco and drug use. Limiting alcohol use. Practicing safe sex. Taking low-dose aspirin every day. Taking vitamin and mineral supplements as recommended by your health care provider. What happens during an annual well check? The services and screenings done by your health care provider during your annual well check will depend on your age, overall health, lifestyle risk factors, and family history of disease. Counseling  Your health care provider may ask you questions about your: Alcohol use. Tobacco  use. Drug use. Emotional well-being. Home and relationship well-being. Sexual activity. Eating habits. History of falls. Memory and ability to understand (cognition). Work and work Statistician. Reproductive health. Screening  You may have the following tests or measurements: Height, weight, and BMI. Blood pressure. Lipid and cholesterol levels. These may be checked every 5 years, or more frequently if you are over 33 years old. Skin check. Lung cancer screening. You may have this screening every year starting at age 65 if you have a 30-pack-year history of smoking and currently smoke or have quit within the past 15 years. Fecal occult blood test (FOBT) of the stool. You may have this test every year starting at age 18. Flexible sigmoidoscopy or colonoscopy. You may have a sigmoidoscopy every 5 years or a colonoscopy every 10 years starting at age 17. Hepatitis C blood test. Hepatitis B blood test. Sexually transmitted disease (STD) testing. Diabetes screening. This is done by checking your blood sugar (glucose) after you have not eaten for a while (fasting). You may have this done every 1-3 years. Bone density scan. This is done to screen for osteoporosis. You may have this done starting at age 38. Mammogram. This may be done every 1-2 years. Talk to your health care provider about how often you should have regular mammograms. Talk with your health care provider about your test results, treatment options, and if necessary, the need for more tests. Vaccines  Your health care provider may recommend certain vaccines, such as: Influenza vaccine. This is recommended every year. Tetanus, diphtheria, and acellular pertussis (Tdap, Td) vaccine. You may need a Td booster every 10 years. Zoster vaccine. You may need this after age 68. Pneumococcal 13-valent conjugate (PCV13) vaccine. One dose is recommended after age 44. Pneumococcal polysaccharide (PPSV23) vaccine. One dose is  recommended  after age 4. Talk to your health care provider about which screenings and vaccines you need and how often you need them. This information is not intended to replace advice given to you by your health care provider. Make sure you discuss any questions you have with your health care provider. Document Released: 10/14/2015 Document Revised: 06/06/2016 Document Reviewed: 07/19/2015 Elsevier Interactive Patient Education  2017 [place] Prevention in the Home Falls can cause injuries. They can happen to people of all ages. There are many things you can do to make your home safe and to help prevent falls. What can I do on the outside of my home? Regularly fix the edges of walkways and driveways and fix any cracks. Remove anything that might make you trip as you walk through a door, such as a raised step or threshold. Trim any bushes or trees on the path to your home. Use bright outdoor lighting. Clear any walking paths of anything that might make someone trip, such as rocks or tools. Regularly check to see if handrails are loose or broken. Make sure that both sides of any steps have handrails. Any raised decks and porches should have guardrails on the edges. Have any leaves, snow, or ice cleared regularly. Use sand or salt on walking paths during winter. Clean up any spills in your garage right away. This includes oil or grease spills. What can I do in the bathroom? Use night lights. Install grab bars by the toilet and in the tub and shower. Do not use towel bars as grab bars. Use non-skid mats or decals in the tub or shower. If you need to sit down in the shower, use a plastic, non-slip stool. Keep the floor dry. Clean up any water that spills on the floor as soon as it happens. Remove soap buildup in the tub or shower regularly. Attach bath mats securely with double-sided non-slip rug tape. Do not have throw rugs and other things on the floor that can make you trip. What can I do  in the bedroom? Use night lights. Make sure that you have a light by your bed that is easy to reach. Do not use any sheets or blankets that are too big for your bed. They should not hang down onto the floor. Have a firm chair that has side arms. You can use this for support while you get dressed. Do not have throw rugs and other things on the floor that can make you trip. What can I do in the kitchen? Clean up any spills right away. Avoid walking on wet floors. Keep items that you use a lot in easy-to-reach places. If you need to reach something above you, use a strong step stool that has a grab bar. Keep electrical cords out of the way. Do not use floor polish or wax that makes floors slippery. If you must use wax, use non-skid floor wax. Do not have throw rugs and other things on the floor that can make you trip. What can I do with my stairs? Do not leave any items on the stairs. Make sure that there are handrails on both sides of the stairs and use them. Fix handrails that are broken or loose. Make sure that handrails are as long as the stairways. Check any carpeting to make sure that it is firmly attached to the stairs. Fix any carpet that is loose or worn. Avoid having throw rugs at the top or bottom of the stairs. If  you do have throw rugs, attach them to the floor with carpet tape. Make sure that you have a light switch at the top of the stairs and the bottom of the stairs. If you do not have them, ask someone to add them for you. What else can I do to help prevent falls? Wear shoes that: Do not have high heels. Have rubber bottoms. Are comfortable and fit you well. Are closed at the toe. Do not wear sandals. If you use a stepladder: Make sure that it is fully opened. Do not climb a closed stepladder. Make sure that both sides of the stepladder are locked into place. Ask someone to hold it for you, if possible. Clearly mark and make sure that you can see: Any grab bars or  handrails. First and last steps. Where the edge of each step is. Use tools that help you move around (mobility aids) if they are needed. These include: Canes. Walkers. Scooters. Crutches. Turn on the lights when you go into a dark area. Replace any light bulbs as soon as they burn out. Set up your furniture so you have a clear path. Avoid moving your furniture around. If any of your floors are uneven, fix them. If there are any pets around you, be aware of where they are. Review your medicines with your doctor. Some medicines can make you feel dizzy. This can increase your chance of falling. Ask your doctor what other things that you can do to help prevent falls. This information is not intended to replace advice given to you by your health care provider. Make sure you discuss any questions you have with your health care provider. Document Released: 07/14/2009 Document Revised: 02/23/2016 Document Reviewed: 10/22/2014 Elsevier Interactive Patient Education  2017 [employer].

## 2022-01-08 ENCOUNTER — Other Ambulatory Visit: Payer: Self-pay | Admitting: Primary Care

## 2022-01-08 DIAGNOSIS — G4700 Insomnia, unspecified: Secondary | ICD-10-CM

## 2022-01-08 MED ORDER — ZOLPIDEM TARTRATE 10 MG PO TABS: 10.0000 mg | ORAL_TABLET | Freq: Every evening | ORAL | 0 refills | Status: DC | PRN

## 2022-01-12 ENCOUNTER — Other Ambulatory Visit: Payer: Self-pay | Admitting: Primary Care

## 2022-01-12 DIAGNOSIS — Z1231 Encounter for screening mammogram for malignant neoplasm of breast: Secondary | ICD-10-CM

## 2022-01-13 ENCOUNTER — Other Ambulatory Visit: Payer: Self-pay | Admitting: Primary Care

## 2022-01-13 DIAGNOSIS — R609 Edema, unspecified: Secondary | ICD-10-CM

## 2022-01-29 ENCOUNTER — Other Ambulatory Visit: Payer: Self-pay | Admitting: Primary Care

## 2022-01-29 DIAGNOSIS — F330 Major depressive disorder, recurrent, mild: Secondary | ICD-10-CM

## 2022-01-30 ENCOUNTER — Encounter: Payer: Self-pay | Admitting: Primary Care

## 2022-01-30 ENCOUNTER — Ambulatory Visit (INDEPENDENT_AMBULATORY_CARE_PROVIDER_SITE_OTHER)
Admission: RE | Admit: 2022-01-30 | Discharge: 2022-01-30 | Disposition: A | Discharge status: Home / Self Care | Payer: PPO | Source: Ambulatory Visit | Attending: Primary Care | Admitting: Primary Care

## 2022-01-30 ENCOUNTER — Ambulatory Visit (INDEPENDENT_AMBULATORY_CARE_PROVIDER_SITE_OTHER): Payer: PPO | Admitting: Primary Care

## 2022-01-30 VITALS — BP 122/80 | HR 55 | Ht 66.0 in | Wt 123.0 lb

## 2022-01-30 DIAGNOSIS — I5032 Chronic diastolic (congestive) heart failure: Secondary | ICD-10-CM | POA: Diagnosis not present

## 2022-01-30 DIAGNOSIS — R0683 Snoring: Secondary | ICD-10-CM | POA: Diagnosis not present

## 2022-01-30 DIAGNOSIS — M545 Low back pain, unspecified: Secondary | ICD-10-CM | POA: Diagnosis not present

## 2022-01-30 DIAGNOSIS — G4700 Insomnia, unspecified: Secondary | ICD-10-CM | POA: Diagnosis not present

## 2022-01-30 DIAGNOSIS — R5382 Chronic fatigue, unspecified: Secondary | ICD-10-CM | POA: Diagnosis not present

## 2022-01-30 DIAGNOSIS — F3342 Major depressive disorder, recurrent, in full remission: Secondary | ICD-10-CM | POA: Diagnosis not present

## 2022-01-30 DIAGNOSIS — G8929 Other chronic pain: Secondary | ICD-10-CM | POA: Diagnosis not present

## 2022-01-30 DIAGNOSIS — Z0001 Encounter for general adult medical examination with abnormal findings: Secondary | ICD-10-CM | POA: Diagnosis not present

## 2022-01-30 DIAGNOSIS — R519 Headache, unspecified: Secondary | ICD-10-CM | POA: Diagnosis not present

## 2022-01-30 DIAGNOSIS — G35 Multiple sclerosis: Secondary | ICD-10-CM

## 2022-01-30 NOTE — Assessment & Plan Note (Signed)
Improved!  No concerns today.   Continue to monitor.  

## 2022-01-30 NOTE — Assessment & Plan Note (Signed)
Symptoms consistent for degenerative disc disease/arthritis. ? ?Checking plain films of the lumbar spine today. ?Referral placed for physical therapy. ? ?Consider MRI versus orthopedic evaluation if no improvement. ?

## 2022-01-30 NOTE — Assessment & Plan Note (Signed)
No longer following with cardiology. ?Overall stable. ? ?Continue furosemide 20 mg daily.   ?CMP pending. ? ?

## 2022-01-30 NOTE — Assessment & Plan Note (Signed)
Controlled. ? ?Continue Ambien 5 to 10 mg at bedtime and gabapentin 100 mg at bedtime. ?
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
## 2022-01-30 NOTE — Patient Instructions (Signed)
Stop by the lab and xray prior to leaving today. I will notify you of your results once received.  ? ?You will be contacted regarding your referral to pulmonology for sleep apnea evaluation and to physical therapy.  Please let us know if you have not been contacted within two weeks.  ? ?It was a pleasure to see you today! ? ?Preventive Care 65 Years and Older, Female ?Preventive care refers to lifestyle choices and visits with your health care provider that can promote health and wellness. Preventive care visits are also called wellness exams. ?What can I expect for my preventive care visit? ?Counseling ?Your health care provider may ask you questions about your: ?Medical history, including: ?Past medical problems. ?Family medical history. ?Pregnancy and menstrual history. ?History of falls. ?Current health, including: ?Memory and ability to understand (cognition). ?Emotional well-being. ?Home life and relationship well-being. ?Sexual activity and sexual health. ?Lifestyle, including: ?Alcohol, nicotine or tobacco, and drug use. ?Access to firearms. ?Diet, exercise, and sleep habits. ?Work and work Statistician. ?Sunscreen use. ?Safety issues such as seatbelt and bike helmet use. ?Physical exam ?Your health care provider will check your: ?Height and weight. These may be used to calculate your BMI (body mass index). BMI is a measurement that tells if you are at a healthy weight. ?Waist circumference. This measures the distance around your waistline. This measurement also tells if you are at a healthy weight and may help predict your risk of certain diseases, such as type 2 diabetes and high blood pressure. ?Heart rate and blood pressure. ?Body temperature. ?Skin for abnormal spots. ?What immunizations do I need? ? ?Vaccines are usually given at various ages, according to a schedule. Your health care provider will recommend vaccines for you based on your age, medical history, and lifestyle or other factors, such as  travel or where you work. ?What tests do I need? ?Screening ?Your health care provider may recommend screening tests for certain conditions. This may include: ?Lipid and cholesterol levels. ?Hepatitis C test. ?Hepatitis B test. ?HIV (human immunodeficiency virus) test. ?STI (sexually transmitted infection) testing, if you are at risk. ?Lung cancer screening. ?Colorectal cancer screening. ?Diabetes screening. This is done by checking your blood sugar (glucose) after you have not eaten for a while (fasting). ?Mammogram. Talk with your health care provider about how often you should have regular mammograms. ?BRCA-related cancer screening. This may be done if you have a family history of breast, ovarian, tubal, or peritoneal cancers. ?Bone density scan. This is done to screen for osteoporosis. ?Talk with your health care provider about your test results, treatment options, and if necessary, the need for more tests. ?Follow these instructions at home: ?Eating and drinking ? ?Eat a diet that includes fresh fruits and vegetables, whole grains, lean protein, and low-fat dairy products. Limit your intake of foods with high amounts of sugar, saturated fats, and salt. ?Take vitamin and mineral supplements as recommended by your health care provider. ?Do not drink alcohol if your health care provider tells you not to drink. ?If you drink alcohol: ?Limit how much you have to 0-1 drink a day. ?Know how much alcohol is in your drink. In the [place] one drink equals one 12 oz bottle of beer (355 mL), one 5 oz glass of wine (148 mL), or one 1? oz glass of hard liquor (44 mL). ?Lifestyle ?Brush your teeth every morning and night with fluoride toothpaste. Floss one time each day. ?Exercise for at least 30 minutes 5 or more days each week. ?  Do not use any products that contain nicotine or tobacco. These products include cigarettes, chewing tobacco, and vaping devices, such as e-cigarettes. If you need help quitting, ask your health care  provider. ?Do not use drugs. ?If you are sexually active, practice safe sex. Use a condom or other form of protection in order to prevent STIs. ?Take aspirin only as told by your health care provider. Make sure that you understand how much to take and what form to take. Work with your health care provider to find out whether it is safe and beneficial for you to take aspirin daily. ?Ask your health care provider if you need to take a cholesterol-lowering medicine (statin). ?Find healthy ways to manage stress, such as: ?Meditation, yoga, or listening to music. ?Journaling. ?Talking to a trusted person. ?Spending time with friends and family. ?Minimize exposure to UV radiation to reduce your risk of skin cancer. ?Safety ?Always wear your seat belt while driving or riding in a vehicle. ?Do not drive: ?If you have been drinking alcohol. Do not ride with someone who has been drinking. ?When you are tired or distracted. ?While texting. ?If you have been using any mind-altering substances or drugs. ?Wear a helmet and other protective equipment during sports activities. ?If you have firearms in your house, make sure you follow all gun safety procedures. ?What's next? ?Visit your health care provider once a year for an annual wellness visit. ?Ask your health care provider how often you should have your eyes and teeth checked. ?Stay up to date on all vaccines. ?This information is not intended to replace advice given to you by your health care provider. Make sure you discuss any questions you have with your health care provider. ?Document Revised: 03/15/2021 Document Reviewed: 03/15/2021 ?Elsevier Patient Education ? [place]. ? ?

## 2022-01-30 NOTE — Assessment & Plan Note (Addendum)
Following with neurology. ? ?No long on Aubagia 14 mg daily. ?Reviewed MRI from December 2022. ? ? ?Continue gabapentin 100 mg HS for restless legs. ? ?

## 2022-01-30 NOTE — Assessment & Plan Note (Signed)
Controlled.  Continue Lexapro 10 mg daily. 

## 2022-01-30 NOTE — Assessment & Plan Note (Signed)
Suspicious for sleep apnea. ? ?Referral placed to pulmonology for sleep study and further evaluation. ? ?We will also check labs today to rule out metabolic cause. ?

## 2022-01-30 NOTE — Assessment & Plan Note (Signed)
Immunizations up-to-date. ?Mammogram up-to-date and also scheduled. ?Colonoscopy up-to-date, due 2026. ?Bone density scan up-to-date. ? ?Commended her on a healthy lifestyle, encouraged to continue. ? ?Exam today as noted. ?Labs pending. ?

## 2022-01-31 LAB — CBC
HCT: 39.8 % (ref 36.0–46.0)
Hemoglobin: 13.1 g/dL (ref 12.0–15.0)
MCHC: 33 g/dL (ref 30.0–36.0)
MCV: 87.6 fl (ref 78.0–100.0)
Platelets: 236 10*3/uL (ref 150.0–400.0)
RBC: 4.55 Mil/uL (ref 3.87–5.11)
RDW: 14.4 % (ref 11.5–15.5)
WBC: 6 10*3/uL (ref 4.0–10.5)

## 2022-01-31 LAB — LIPID PANEL
Cholesterol: 232 mg/dL — ABNORMAL HIGH (ref 0–200)
HDL: 87.6 mg/dL (ref >39.00)
LDL Cholesterol: 116 mg/dL — ABNORMAL HIGH (ref 0–99)
NonHDL: 144.18
Total CHOL/HDL Ratio: 3
Triglycerides: 140 mg/dL (ref 0.0–149.0)
VLDL: 28 mg/dL (ref 0.0–40.0)

## 2022-01-31 LAB — VITAMIN B12: Vitamin B-12: 1081 pg/mL — ABNORMAL HIGH (ref 211–911)

## 2022-01-31 LAB — TSH: TSH: 1.63 u[IU]/mL (ref 0.35–5.50)

## 2022-01-31 LAB — VITAMIN D 25 HYDROXY (VIT D DEFICIENCY, FRACTURES): VITD: 72.59 ng/mL (ref 30.00–100.00)

## 2022-02-02 ENCOUNTER — Ambulatory Visit (INDEPENDENT_AMBULATORY_CARE_PROVIDER_SITE_OTHER): Payer: PPO | Admitting: Primary Care

## 2022-02-02 ENCOUNTER — Encounter: Payer: Self-pay | Admitting: Primary Care

## 2022-02-02 VITALS — BP 124/74 | HR 75 | Temp 97.8°F | Ht 66.0 in | Wt 122.4 lb

## 2022-02-02 DIAGNOSIS — G4700 Insomnia, unspecified: Secondary | ICD-10-CM

## 2022-02-02 DIAGNOSIS — R0683 Snoring: Secondary | ICD-10-CM

## 2022-02-02 HISTORY — DX: Snoring: R06.83

## 2022-02-02 NOTE — Assessment & Plan Note (Signed)
-   Patient has symptoms of loud snoring, witnessed apnea and daytime sleepiness.  Epworth 9.  BMI 19.  Concern patient could have obstructive sleep apnea, needs home sleep study to evaluate for OSA.  Reviewed risks of untreated sleep apnea including cardiac arrhythmias, stroke, pulmonary hypertension and diabetes.  We also discussed treatment options including weight management, oral appliance, CPAP therapy or referral to ear nose and throat for possible surgical options.  She is hesitant to try either oral appliance or CPAP but is willing to discuss treatment options if sleep apnea is severe.  Encourage side sleeping position and advised patient not to drive if experiencing excessive daytime sleepiness or fatigue.  Follow-up in 4 to 6 weeks after sleep study to review results.  ?

## 2022-02-02 NOTE — Assessment & Plan Note (Signed)
-   Well controlled; Continue Gabapentin '100mg'$  and Ambien '5mg'$  at bedtime for insomnia. Review PMP and discuss safe use.  ?

## 2022-02-02 NOTE — Patient Instructions (Signed)
Sleep apnea puts you at high risk for cardiac arrhythmias, stroke, pulmonary hypertension, diabetes ? ?Treatment options include weight loss, oral appliance, CPAP therapy or referral to ENT for possible surgical options ? ?Recommendations ?Do not drive experiencing excessive daytime sleepiness or fatigue ?Focus on side sleeping position or elevate head of bed with wedge pillow ? ? ?Orders ?Home sleep study re: snoring ? ?Follow-up ?Please call our office after completing sleep study to set up a follow-up visit with Beth NP to review sleep study results and treatment options if needed (can be virtual or in person) ? ?Sleep Apnea ?Sleep apnea is a condition in which breathing pauses or becomes shallow during sleep. People with sleep apnea usually snore loudly. They may have times when they gasp and stop breathing for 10 seconds or more during sleep. This may happen many times during the night. ?Sleep apnea disrupts your sleep and keeps your body from getting the rest that it needs. This condition can increase your risk of certain health problems, including: ?Heart attack. ?Stroke. ?Obesity. ?Type 2 diabetes. ?Heart failure. ?Irregular heartbeat. ?High blood pressure. ?The goal of treatment is to help you breathe normally again. ?What are the causes? ? ?The most common cause of sleep apnea is a collapsed or blocked airway. ?There are three kinds of sleep apnea: ?Obstructive sleep apnea. This kind is caused by a blocked or collapsed airway. ?Central sleep apnea. This kind happens when the part of the brain that controls breathing does not send the correct signals to the muscles that control breathing. ?Mixed sleep apnea. This is a combination of obstructive and central sleep apnea. ?What increases the risk? ?You are more likely to develop this condition if you: ?Are overweight. ?Smoke. ?Have a smaller than normal airway. ?Are older. ?Are female. ?Drink alcohol. ?Take sedatives or tranquilizers. ?Have a family history of  sleep apnea. ?Have a tongue or tonsils that are larger than normal. ?What are the signs or symptoms? ?Symptoms of this condition include: ?Trouble staying asleep. ?Loud snoring. ?Morning headaches. ?Waking up gasping. ?Dry mouth or sore throat in the morning. ?Daytime sleepiness and tiredness. ?If you have daytime fatigue because of sleep apnea, you may be more likely to have: ?Trouble concentrating. ?Forgetfulness. ?Irritability or mood swings. ?Personality changes. ?Feelings of depression. ?Sexual dysfunction. This may include loss of interest if you are female, or erectile dysfunction if you are female. ?How is this diagnosed? ?This condition may be diagnosed with: ?A medical history. ?A physical exam. ?A series of tests that are done while you are sleeping (sleep study). These tests are usually done in a sleep lab, but they may also be done at home. ?How is this treated? ?Treatment for this condition aims to restore normal breathing and to ease symptoms during sleep. It may involve managing health issues that can affect breathing, such as high blood pressure or obesity. Treatment may include: ?Sleeping on your side. ?Using a decongestant if you have nasal congestion. ?Avoiding the use of depressants, including alcohol, sedatives, and narcotics. ?Losing weight if you are overweight. ?Making changes to your diet. ?Quitting smoking. ?Using a device to open your airway while you sleep, such as: ?An oral appliance. This is a custom-made mouthpiece that shifts your lower jaw forward. ?A continuous positive airway pressure (CPAP) device. This device blows air through a mask when you breathe out (exhale). ?A nasal expiratory positive airway pressure (EPAP) device. This device has valves that you put into each nostril. ?A bi-level positive airway pressure (BIPAP) device. This  device blows air through a mask when you breathe in (inhale) and breathe out (exhale). ?Having surgery if other treatments do not work. During  surgery, excess tissue is removed to create a wider airway. ?Follow these instructions at home: ?Lifestyle ?Make any lifestyle changes that your health care provider recommends. ?Eat a healthy, well-balanced diet. ?Take steps to lose weight if you are overweight. ?Avoid using depressants, including alcohol, sedatives, and narcotics. ?Do not use any products that contain nicotine or tobacco. These products include cigarettes, chewing tobacco, and vaping devices, such as e-cigarettes. If you need help quitting, ask your health care provider. ?General instructions ?Take over-the-counter and prescription medicines only as told by your health care provider. ?If you were given a device to open your airway while you sleep, use it only as told by your health care provider. ?If you are having surgery, make sure to tell your health care provider you have sleep apnea. You may need to bring your device with you. ?Keep all follow-up visits. This is important. ?Contact a health care provider if: ?The device that you received to open your airway during sleep is uncomfortable or does not seem to be working. ?Your symptoms do not improve. ?Your symptoms get worse. ?Get help right away if: ?You develop: ?Chest pain. ?Shortness of breath. ?Discomfort in your back, arms, or stomach. ?You have: ?Trouble speaking. ?Weakness on one side of your body. ?Drooping in your face. ?These symptoms may represent a serious problem that is an emergency. Do not wait to see if the symptoms will go away. Get medical help right away. Call your local emergency services (911 in the [place] Do not drive yourself to the hospital. ?Summary ?Sleep apnea is a condition in which breathing pauses or becomes shallow during sleep. ?The most common cause is a collapsed or blocked airway. ?The goal of treatment is to restore normal breathing and to ease symptoms during sleep. ?This information is not intended to replace advice given to you by your health care  provider. Make sure you discuss any questions you have with your health care provider. ?Document Revised: 04/26/2021 Document Reviewed: 08/26/2020 ?Elsevier Patient Education ? [place]. ? ?

## 2022-02-02 NOTE — Progress Notes (Signed)
? ?'@Patient'$  ID: Morgan Herrera, female    DOB: 07-23-1951, 71 y.o.   MRN: [id number] ? ?Chief Complaint  ?Patient presents with  ? sleep consult  ?  No prior sleep study. C/o loud snoring, daytime sleepiness and gasping during sleep.   ? ? ?Referring provider: ?Pleas Koch, NP ? ?HPI: ?71 year old female, former smoker quit in 2000.  Past medical history significant for chronic diastolic heart failure, multiple sclerosis, chronic fatigue, depression, insomnia, vitamin D deficiency, and PMR. ? ?02/02/2022 ?Patient presents today for sleep consult. She has symptoms of loud snoring, wakes of gasping for air at night, witnessed apnea and daytime sleepiness.  Her husband has told her that if she sleeps on her back her snoring and apnea are worse. Daytime drowsiness has been more noticeable the last couple of months. Most days she needs to take a mid afternoon. If she falls asleep on the couch she will wake up very suddenly after falling asleep. She is active throughout the day, walks and goes to silver sneakers. Typical bedtime is between 9 and 10 PM.  She takes 5 mg of Ambien as needed at bedtime. It takes her approximately 30 minutes to fall asleep.  She wakes up once at night.  She starts her day at 6 AM.  Her weight is up 5 pounds in the last 2 years.  She has never had a prior sleep study.  She is not on CPAP.  She does not wear oxygen.  Denies symptoms of narcolepsy, cataplexy or sleepwalking.  Epworth 9. ? ? ?No Known Allergies ? ?Immunization History  ?Administered Date(s) Administered  ? Fluad Quad(high Dose 65+) 06/11/2019  ? Influenza, High Dose Seasonal PF 06/13/2020, 06/28/2021  ? Influenza,inj,Quad PF,6+ Mos 06/26/2018  ? Influenza-Unspecified 06/15/2020  ? PFIZER Comirnaty(Gray Top)Covid-19 Tri-Sucrose Vaccine 01/02/2021  ? PFIZER(Purple Top)SARS-COV-2 Vaccination 10/26/2019, 11/16/2019, 06/27/2020  ? PPD Test 06/15/2016  ? Pension scheme manager 43yr & up 08/15/2021  ?  Pneumococcal Conjugate-13 04/10/2017  ? Pneumococcal Polysaccharide-23 05/26/2015  ? Td 11/30/2014  ? Zoster Recombinat (Shingrix) 07/28/2019, 01/25/2020  ? Zoster, Live 10/01/2010  ? ? ?Past Medical History:  ?Diagnosis Date  ? (HFpEF) heart failure with preserved ejection fraction ([place]   ? a. 03/2018 Echo: EF 55-60%, no rwma. Mod MR. Nl PASP; b. 03/2019 Echo: EF 60-65%, ? DD, mod MR.  ? Acute CHF (congestive heart failure) ([facility] 04/23/2018  ? Chest pain   ? a. 03/2018 Ex MV: rest only imaging showed ant wall perfusion defect. No stress imaging as pt developed hypotension w/ exercise; b. 05/2018 Cath: Nl cors w/ sluggish flow in the LAD suggestive of endothelial dysfxn. EF 55-65%.  ? Full body hives 09/12/2020  ? Migraine   ? Multiple sclerosis ([facility]   ? Neuromuscular disorder ([place]   ? PMR (polymyalgia rheumatica) (HCC)   ? Pulmonary nodule   ? a. 03/2018 CTA Chest: 958msmall spiculated nodular density @ the L lung apex. Rec f/u in 3 mos.  ? Relapsing remitting multiple sclerosis (HC[place]3/14/2017  ? ? ?Tobacco History: ?Social History  ? ?Tobacco Use  ?Smoking Status Former  ? Packs/day: 1.00  ? Years: 45.00  ? Pack years: 45.00  ? Types: Cigarettes  ? Quit date: 2000  ? Years since quitting: 23.3  ?Smokeless Tobacco Never  ? ?Counseling given: Not Answered ? ? ?Outpatient Medications Prior to Visit  ?Medication Sig Dispense Refill  ? albuterol (VENTOLIN HFA) 108 (90 Base) MCG/ACT inhaler Inhale 1-2 puffs into  the lungs every 6 (six) hours as needed for wheezing or shortness of breath. 1 g 1  ? calcium carbonate (OS-CAL - DOSED IN MG OF ELEMENTAL CALCIUM) 1250 (500 Ca) MG tablet Take 1 tablet by mouth daily.    ? cholecalciferol (VITAMIN D) 1000 units tablet Take 2,000 Units by mouth daily.    ? cyanocobalamin 500 MCG tablet Take 500 mcg by mouth daily.    ? escitalopram (LEXAPRO) 10 MG tablet TAKE 1 TABLET BY MOUTH DAILY FOR DEPRESSION. 90 tablet 3  ? furosemide (LASIX) 20 MG tablet Take 1 tablet (20 mg total) by mouth  daily. Office visit required for further refills. 90 tablet 0  ? gabapentin (NEURONTIN) 100 MG capsule TAKE 1 CAPSULE BY MOUTH THREE TIMES A DAY 270 capsule 2  ? Magnesium 250 MG TABS Take by mouth.    ? zolpidem (AMBIEN) 10 MG tablet Take 1 tablet (10 mg total) by mouth at bedtime as needed for sleep. (Patient taking differently: Take 5 mg by mouth at bedtime as needed for sleep.) 30 tablet 0  ? ?No facility-administered medications prior to visit.  ? ?Review of Systems ? ?Review of Systems  ?Constitutional:  Positive for fatigue.  ?HENT: Negative.    ?Respiratory:  Positive for apnea.   ?Cardiovascular: Negative.   ? ? ?Physical Exam ? ?BP 124/74 (BP Location: Left Arm, Cuff Size: Normal)   Pulse 75   Temp 97.8 ?F (36.6 ?C) (Temporal)   Ht '5\' 6"'$  (1.676 m)   Wt 122 lb 6.4 oz (55.5 kg)   SpO2 95%   BMI 19.76 kg/m?  ?Physical Exam ?Constitutional:   ?   Appearance: Normal appearance.  ?HENT:  ?   Head: Normocephalic and atraumatic.  ?   Mouth/Throat:  ?   Mouth: Mucous membranes are moist.  ?   Pharynx: Oropharynx is clear.  ?   Comments: Mallampati class I ?Cardiovascular:  ?   Rate and Rhythm: Normal rate and regular rhythm.  ?Pulmonary:  ?   Effort: Pulmonary effort is normal.  ?   Breath sounds: Normal breath sounds.  ?Musculoskeletal:     ?   General: Normal range of motion.  ?   Cervical back: Normal range of motion and neck supple.  ?Skin: ?   General: Skin is warm and dry.  ?Neurological:  ?   General: No focal deficit present.  ?   Mental Status: She is alert and oriented to person, place, and time. Mental status is at baseline.  ?Psychiatric:     ?   Mood and Affect: Mood normal.     ?   Behavior: Behavior normal.     ?   Thought Content: Thought content normal.     ?   Judgment: Judgment normal.  ?  ? ?Lab Results: ? ?CBC ?   ?Component Value Date/Time  ? WBC 6.0 01/30/2022 1440  ? RBC 4.55 01/30/2022 1440  ? HGB 13.1 01/30/2022 1440  ? HGB 12.5 12/24/2019 0000  ? HCT 39.8 01/30/2022 1440  ? HCT 38.4  12/24/2019 0000  ? PLT 236.0 01/30/2022 1440  ? MCV 87.6 01/30/2022 1440  ? MCV 88 12/24/2019 0000  ? MCH 28.5 12/24/2019 0000  ? MCH 28.0 07/18/2018 0947  ? MCHC 33.0 01/30/2022 1440  ? RDW 14.4 01/30/2022 1440  ? RDW 13.3 12/24/2019 0000  ? LYMPHSABS 1.0 08/07/2021 1010  ? LYMPHSABS 1.1 12/24/2019 0000  ? MONOABS 0.6 08/07/2021 1010  ? EOSABS 0.1 08/07/2021 1010  ?  EOSABS 0.2 12/24/2019 0000  ? BASOSABS 0.1 08/07/2021 1010  ? BASOSABS 0.1 12/24/2019 0000  ? ? ?BMET ?   ?Component Value Date/Time  ? NA 140 08/07/2021 1010  ? NA 142 05/15/2018 1515  ? K 4.7 08/07/2021 1010  ? CL 102 08/07/2021 1010  ? CO2 30 08/07/2021 1010  ? GLUCOSE 97 08/07/2021 1010  ? BUN 22 08/07/2021 1010  ? BUN 26 05/15/2018 1515  ? CREATININE 0.81 08/07/2021 1010  ? CREATININE 0.78 12/24/2019 1334  ? CALCIUM 9.7 08/07/2021 1010  ? GFRNONAA 78 12/24/2019 1334  ? GFRAA 91 12/24/2019 1334  ? ? ?BNP ?   ?Component Value Date/Time  ? BNP 483.0 (H) 04/23/2018 1744  ? ? ?ProBNP ?   ?Component Value Date/Time  ? PROBNP 161.0 (H) 05/02/2018 0758  ? ? ?Imaging: ?DG Lumbar Spine 2-3 Views ? ?Result Date: 01/31/2022 ?CLINICAL DATA:  Chronic back pain since January 2023. EXAM: LUMBAR SPINE - 2-3 VIEW COMPARISON:  None Available. FINDINGS: There is no evidence of lumbar spine fracture. Alignment is normal. Narrow intervertebral space is identified more prominently involving the L5-S1 and to a lesser degree at L4-5, and L3-4. 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 Electronically Signed   By: Abelardo Diesel M.D.   On: 01/31/2022 09:43   ? ? ?Assessment & Plan:  ? ?Loud snoring ?- Patient has symptoms of loud snoring, witnessed apnea and daytime sleepiness.  Epworth 9.  BMI 19.  Concern patient could have obstructive sleep apnea, needs home sleep study to evaluate for OSA.  Reviewed risks of untreated sleep apnea including cardiac arrhythmias, stroke, pulmonary hypertension and diabetes.  We also discussed treatment options  including weight management, oral appliance, CPAP therapy or referral to ear nose and throat for possible surgical options.  She is hesitant to try either oral appliance or CPAP but is willing to discuss treatment options

## 2022-02-02 NOTE — Progress Notes (Signed)
Reviewed and agree with assessment/plan. ? ? ?Chesley Mires, MD ?[place] ?02/02/2022, 9:46 AM ?Pager:  [phone] ? ?

## 2022-02-02 NOTE — Addendum Note (Signed)
Addended by: Claudette Head A on: 02/02/2022 10:00 AM ? ? Modules accepted: Orders ? ?

## 2022-02-09 ENCOUNTER — Ambulatory Visit: Payer: PPO | Admitting: Rehabilitative and Restorative Service Providers"

## 2022-02-09 ENCOUNTER — Encounter: Payer: Self-pay | Admitting: Rehabilitative and Restorative Service Providers"

## 2022-02-09 DIAGNOSIS — M5459 Other low back pain: Secondary | ICD-10-CM

## 2022-02-09 DIAGNOSIS — R262 Difficulty in walking, not elsewhere classified: Secondary | ICD-10-CM

## 2022-02-09 DIAGNOSIS — R293 Abnormal posture: Secondary | ICD-10-CM | POA: Diagnosis not present

## 2022-02-09 NOTE — Therapy (Signed)
?OUTPATIENT PHYSICAL THERAPY THORACOLUMBAR EVALUATION ? ? ?Patient Name: Morgan Herrera ?MRN: [id number] ?DOB:1950-10-27, 71 y.o., female ?Today's Date: 02/09/2022 ? ? PT End of Session - 02/09/22 1612   ? ? Visit Number 1   ? Number of Visits 12   ? PT Start Time 1515   ? PT Stop Time 3500   ? PT Time Calculation (min) 53 min   ? Activity Tolerance Patient tolerated treatment well;No increased pain   ? Behavior During Therapy [facility] for tasks assessed/performed   ? ?  ?  ? ?  ? ? ?Past Medical History:  ?Diagnosis Date  ? (HFpEF) heart failure with preserved ejection fraction ([place])   ? a. 03/2018 Echo: EF 55-60%, no rwma. Mod MR. Nl PASP; b. 03/2019 Echo: EF 60-65%, ? DD, mod MR.  ? Acute CHF (congestive heart failure) ([place]) 04/23/2018  ? Chest pain   ? a. 03/2018 Ex MV: rest only imaging showed ant wall perfusion defect. No stress imaging as pt developed hypotension w/ exercise; b. 05/2018 Cath: Nl cors w/ sluggish flow in the LAD suggestive of endothelial dysfxn. EF 55-65%.  ? Full body hives 09/12/2020  ? Migraine   ? Multiple sclerosis ([place])   ? Neuromuscular disorder ([place])   ? PMR (polymyalgia rheumatica) (HCC)   ? Pulmonary nodule   ? a. 03/2018 CTA Chest: 8m small spiculated nodular density @ the L lung apex. Rec f/u in 3 mos.  ? Relapsing remitting multiple sclerosis ([facility] 12/13/2015  ? ?Past Surgical History:  ?Procedure Laterality Date  ? BREAST EXCISIONAL BIOPSY    ? BREAST SURGERY  1990  ? COLONOSCOPY WITH PROPOFOL N/A 05/19/2015  ? Procedure: COLONOSCOPY WITH PROPOFOL;  Surgeon: PHulen Luster MD;  [facility];  Service: Gastroenterology;  Laterality: N/A;  ? EYE SURGERY    ? 1978 1st of 3 eye surgeries  ? LEFT HEART CATH AND CORONARY ANGIOGRAPHY N/A 04/25/2018  ? Procedure: LEFT HEART CATH AND CORONARY ANGIOGRAPHY;  Surgeon: AWellington Hampshire MD;  [facility];  Service: Cardiovascular;  Laterality: N/A;  ? TENDON REPAIR    ? ?Patient Active Problem List  ? Diagnosis Date Noted  ?  Loud snoring 02/02/2022  ? Chronic fatigue 01/30/2022  ? Chronic left-sided low back pain without sciatica 01/30/2022  ? Cerumen impaction 07/08/2020  ? Frequent headaches 07/08/2020  ? Puncture wound 03/04/2020  ? Chronic diastolic heart failure ([place] 05/07/2018  ? Shortness of breath 04/30/2018  ? Unstable angina (HCC)   ? PMR (polymyalgia rheumatica) (HCC) 04/23/2018  ? Welcome to Medicare preventive visit 04/10/2017  ? Constipation 03/19/2016  ? Insomnia 09/19/2015  ? Encounter for annual general medical examination with abnormal findings in adult 03/04/2015  ? Vitamin D deficiency 03/04/2015  ? Multiple sclerosis ([place] 02/11/2015  ? Depression 02/11/2015  ? ? ?PCP: KPleas Koch NP ? ?REFERRING PROVIDER: KPleas Koch NP ? ?REFERRING DIAG: Diagnosis Information ? ?Diagnosis  ?M54.50,G89.29 (ICD-10-CM) - Chronic left-sided low back pain without sciatica  ? ? ?THERAPY DIAG:  ?Difficulty in walking, not elsewhere classified ? ?Abnormal posture ? ?Other low back pain ? ?ONSET DATE: January 2023 ? ?SUBJECTIVE:                                                                                                                                                                                          ? ?  SUBJECTIVE STATEMENT: ?L sided low back pain since January.   ? ?PERTINENT HISTORY:  ?CHF, MS, PMR ? ?PAIN:  ?Are you having pain? Yes: NPRS scale: 3-4/10 ?Pain location: L sided low back around belt line ?Pain description: Ache ?Aggravating factors: Walking ?Relieving factors: Sometimes exercise ? ? ?PRECAUTIONS: Back ? ?WEIGHT BEARING RESTRICTIONS No ? ?FALLS:  ?Has patient fallen in last 6 months? Yes. Number of falls 1X, accident ? ?LIVING ENVIRONMENT: ?Lives with: lives alone ?Lives in: House/apartment ?Stairs:  No problems ?Has following equipment at home: None ? ?OCCUPATION: Retired ? ?PLOF: Independent ? ?PATIENT GOALS Get rid of pain with walking, yoga, ADLs ? ? ?OBJECTIVE:  ? ?DIAGNOSTIC FINDINGS:   ?FINDINGS: ?There is no evidence of lumbar spine fracture. Alignment is normal. ?Narrow intervertebral space is identified more prominently involving ?the L5-S1 and to a lesser degree at L4-5, and L3-4. ?  ?IMPRESSION: ?Mild degenerative joint changes of lower lumbar spine. ? ?PATIENT SURVEYS:  ?FOTO 55 (Goal 78 in 10 visits) ? ?SCREENING FOR RED FLAGS: ?Bowel or bladder incontinence: No ?Spinal tumors: No ?Cauda equina syndrome: No ?Compression fracture: No ?Abdominal aneurysm: No ? ?COGNITION: ? Overall cognitive status: Within functional limits for tasks assessed   ?  ?SENSATION: ?Not tested ? ?MUSCLE LENGTH: ?Hamstrings: Right 60 deg; Left 65 deg ? ? ?POSTURE:  ?Mild forward head, IR and protracted shoulders and decreased lumbar lordosis ? ?PALPATION: ?Some muscle guarding of the Left L4-S1 paraspinals ? ?LUMBAR ROM:  ? ?Active  A/PROM  ?02/09/2022  ?Flexion   ?Extension 20  ?Right lateral flexion   ?Left lateral flexion   ?Right rotation   ?Left rotation   ? (Blank rows = not tested) ? ?LE ROM: ? ?Passive  Right ?02/09/2022 Left ?02/09/2022  ?Hip flexion 115 120  ?Hip extension    ?Hip abduction    ?Hip adduction    ?Hip internal rotation 10 20  ?Hip external rotation 40 42  ?Knee flexion    ?Knee extension    ?Ankle dorsiflexion    ?Ankle plantarflexion    ?Ankle inversion    ?Ankle eversion    ? (Blank rows = not tested) ? ?LE MMT: ? ?MMT Right ?02/09/2022 Left ?02/09/2022  ?Hip flexion    ?Hip extension    ?Hip abduction    ?Hip adduction    ?Hip internal rotation    ?Hip external rotation    ?Knee flexion    ?Knee extension    ?Ankle dorsiflexion    ?Ankle plantarflexion    ?Ankle inversion    ?Ankle eversion    ? (Blank rows = not tested) ? ?TODAY'S TREATMENT  ?Lumbar extension 10X 3 seconds ?Shoulder blade pinches 10X 5 seconds ?Gluteal stretch 4X 20 seconds ?Prone superman 10X 5 seconds ?Hip hike in doorframe 5X 3 seconds ? ?Therapeutic Activities: Review exam findings, imaging, spine anatomy education with  spine model, review HEP ? ? ?PATIENT EDUCATION:  ?Education details: See above ?Person educated: Patient ?Education method: Explanation, Demonstration, Tactile cues, Verbal cues, and Handouts ?Education comprehension: verbalized understanding, returned demonstration, verbal cues required, tactile cues required, and needs further education ? ? ?HOME EXERCISE PROGRAM: ?Access Code: [email or web address] ?URL: [email or web address] ?Date: 02/09/2022 ?Prepared by: Vista Mink ? ?Exercises ?- Standing Lumbar Extension at [place] 5 x daily - 7 x weekly - 1 sets - 5 reps - 3 seconds hold ?- Standing Scapular Retraction  - 5 x daily - 7 x weekly - 1 sets - 5 reps - 5 second  hold ?- Supine Gluteus Stretch  - 2 x daily - 7 x weekly - 1 sets - 5 reps - 20 seconds hold ?- Full Superman on Table  - 1 x daily - 7 x weekly - 1 sets - 5 reps - 10 seconds hold ?- Standing Hip Hiking  - 2 x daily - 7 x weekly - 2 sets - 5 reps - 3 seconds hold ? ?ASSESSMENT: ? ?CLINICAL IMPRESSION: ?Patient is a 71 y.o. female who was seen today for physical therapy evaluation and treatment for L sided low back pain.  She has no outsized objective impairments.  Mild strength impairments (fatigue with superman test), limited lower lumbar AROM when testing extension and some imbalances in her hip flexibility (L vs R) will be addressed.  Dry needling may be beneficial, although my colleagues will make that call as they are certified to dry needle.  I anticipate a quick rehabilitation.  ? ? ?OBJECTIVE IMPAIRMENTS decreased activity tolerance, decreased endurance, decreased knowledge of condition, difficulty walking, decreased ROM, decreased strength, decreased safety awareness, increased fascial restrictions, increased muscle spasms, impaired flexibility, improper body mechanics, postural dysfunction, and pain.  ? ?ACTIVITY LIMITATIONS community activity and walking and yoga .  ? ?PERSONAL FACTORS CHF, MS, PMR are also affecting patient's  functional outcome.  ? ? ?REHAB POTENTIAL: Excellent ? ?CLINICAL DECISION MAKING: Stable/uncomplicated ? ?EVALUATION COMPLEXITY: Low ? ? ?GOALS: ?Goals reviewed with patient? Yes ? ?SHORT TERM GOALS: [employer] dat

## 2022-02-22 ENCOUNTER — Ambulatory Visit
Admission: RE | Admit: 2022-02-22 | Discharge: 2022-02-22 | Disposition: A | Discharge status: Home / Self Care | Payer: PPO | Source: Ambulatory Visit

## 2022-02-22 DIAGNOSIS — Z1231 Encounter for screening mammogram for malignant neoplasm of breast: Secondary | ICD-10-CM

## 2022-02-27 NOTE — Therapy (Unsigned)
OUTPATIENT PHYSICAL THERAPY TREATMENT NOTE   Patient Name: Morgan Herrera MRN: [id number] DOB:17-May-1951, 71 y.o., female Today's Date: 02/28/2022  PCP: Pleas Koch, NP REFERRING PROVIDER: Pleas Koch, NP  END OF SESSION:   PT End of Session - 02/28/22 0913     Visit Number 2    Number of Visits 12    PT Start Time 0800    PT Stop Time 0848    PT Time Calculation (min) 48 min    Activity Tolerance Patient tolerated treatment well;No increased pain    Behavior During Therapy WFL for tasks assessed/performed             Past Medical History:  Diagnosis Date   (HFpEF) heart failure with preserved ejection fraction ([place])    a. 03/2018 Echo: EF 55-60%, no rwma. Mod MR. Nl PASP; b. 03/2019 Echo: EF 60-65%, ? DD, mod MR.   Acute CHF (congestive heart failure) ([place]) 04/23/2018   Chest pain    a. 03/2018 Ex MV: rest only imaging showed ant wall perfusion defect. No stress imaging as pt developed hypotension w/ exercise; b. 05/2018 Cath: Nl cors w/ sluggish flow in the LAD suggestive of endothelial dysfxn. EF 55-65%.   Full body hives 09/12/2020   Migraine    Multiple sclerosis (HCC)    Neuromuscular disorder (HCC)    PMR (polymyalgia rheumatica) (HCC)    Pulmonary nodule    a. 03/2018 CTA Chest: 33m small spiculated nodular density @ the L lung apex. Rec f/u in 3 mos.   Relapsing remitting multiple sclerosis ([place] 12/13/2015   Past Surgical History:  Procedure Laterality Date   BREAST EXCISIONAL BIOPSY     BREAST SURGERY  1990   COLONOSCOPY WITH PROPOFOL N/A 05/19/2015   Procedure: COLONOSCOPY WITH PROPOFOL;  Surgeon: PHulen Luster MD;  [facility];  Service: Gastroenterology;  Laterality: N/A;   EYE SURGERY     1978 1st of 3 eye surgeries   LEFT HEART CATH AND CORONARY ANGIOGRAPHY N/A 04/25/2018   Procedure: LEFT HEART CATH AND CORONARY ANGIOGRAPHY;  Surgeon: AWellington Hampshire MD;  [facility];  Service: Cardiovascular;  Laterality:  N/A;   TENDON REPAIR     Patient Active Problem List   Diagnosis Date Noted   Loud snoring 02/02/2022   Chronic fatigue 01/30/2022   Chronic left-sided low back pain without sciatica 01/30/2022   Cerumen impaction 07/08/2020   Frequent headaches 07/08/2020   Puncture wound 03/04/2020   Chronic diastolic heart failure ([place] 05/07/2018   Shortness of breath 04/30/2018   Unstable angina (HCC)    PMR (pol[place]algia rheumatica) ([place] 04/23/2018   Welcome to Medicare preventive visit 04/10/2017   Constipation 03/19/2016   Insomnia 09/19/2015   Encounter for annual general medical examination with abnormal findings in adult 03/04/2015   Vitamin D deficiency 03/04/2015   Multiple sclerosis ([place] 02/11/2015   Depression 02/11/2015    REFERRING DIAG: M54.50,G89.29 (ICD-10-CM) - Chronic left-sided low back pain without sciatica  THERAPY DIAG:  Difficulty in walking, not elsewhere classified  Abnormal posture  Other low back pain  Rationale for Evaluation and Treatment Rehabilitation  PERTINENT HISTORY: CHF, MS, PMR  PRECAUTIONS: no surgical precautions   SUBJECTIVE: Pt stating pain in her left side low back of 3/10. Pt stating increased activity makes her pain worse.   PAIN:  Are you having pain? Yes: NPRS scale: 3/10 Pain location: left side low back Pain description: achy Aggravating factors: walking, being active Relieving  factors: resting   OBJECTIVE: (objective measures completed at initial evaluation unless otherwise dated)   DIAGNOSTIC FINDINGS:  FINDINGS: There is no evidence of lumbar spine fracture. Alignment is normal. Narrow intervertebral space is identified more prominently involving the L5-S1 and to a lesser degree at L4-5, and L3-4.   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   PATIENT SURVEYS:  FOTO 55 (Goal 78 in 10 visits)   SCREENING FOR RED FLAGS: Bowel or bladder incontinence: No Spinal tumors: No Cauda equina  syndrome: No Compression fracture: No Abdominal aneurysm: No   COGNITION:           Overall cognitive status: Within functional limits for tasks assessed                          SENSATION: Not tested   MUSCLE LENGTH: Hamstrings: Right 60 deg; Left 65 deg     POSTURE:  Mild forward head, IR and protracted shoulders and decreased lumbar lordosis   PALPATION: Some muscle guarding of the Left L4-S1 paraspinals   LUMBAR ROM:    Active  A/PROM  02/09/2022  Flexion    Extension 20  Right lateral flexion    Left lateral flexion    Right rotation    Left rotation     (Blank rows = not tested)   LE ROM:   Passive  Right 02/09/2022 Left 02/09/2022  Hip flexion 115 120  Hip extension      Hip abduction      Hip adduction      Hip internal rotation 10 20  Hip external rotation 40 42  Knee flexion      Knee extension      Ankle dorsiflexion      Ankle plantarflexion      Ankle inversion      Ankle eversion       (Blank rows = not tested)   LE MMT:   MMT Right 02/09/2022 Left 02/09/2022  Hip flexion      Hip extension      Hip abduction      Hip adduction      Hip internal rotation      Hip external rotation      Knee flexion      Knee extension      Ankle dorsiflexion      Ankle plantarflexion      Ankle inversion      Ankle eversion       (Blank rows = not tested)   TODAY'S TREATMENT  02/28/2022: TherEx:  Nustep: Level 5 x 7 minutes  Prone: opposite arm/leg x 10 each  Prone: press ups x 3 holding 20 seconds Trunk rotation: x 3 holding 30 seconds Hamstring stretch x 3 holding 30 seconds each LE Piriformis stretch: x 1 holding 60 seconds each LE Trigger Point Dry-Needling  Treatment instructions: Expect mild to moderate muscle soreness. S/S of pneumothorax if dry needled over a lung field, and to seek immediate medical attention should they occur. Patient verbalized understanding of these instructions and education.  Patient Consent Given: Yes Education  handout provided: Yes Muscles treated: left lumbar paraspinals, left glute medius, left piriformis Electrical stimulation performed: No Parameters: N/A Treatment response/outcome: twitch response noted in all muscles Pt with normal response to treatment     01/30/2022: Lumbar extension 10X 3 seconds Shoulder blade pinches 10X 5 seconds Gluteal stretch 4X 20 seconds Prone superman 10X 5 seconds Hip hike  in doorframe 5X 3 seconds   Therapeutic Activities: Review exam findings, imaging, spine anatomy education with spine model, review HEP     PATIENT EDUCATION:  Education details: See above Person educated: Patient Education method: Explanation, Demonstration, Tactile cues, Verbal cues, and Handouts Education comprehension: verbalized understanding, returned demonstration, verbal cues required, tactile cues required, and needs further education     HOME EXERCISE PROGRAM: Access Code: [email or web address] URL: [email or web address] Date: 02/28/2022 Prepared by: Kearney Hard  Exercises - Standing Lumbar Extension at [place]  - 5 x daily - 7 x weekly - 1 sets - 5 reps - 3 seconds hold - Standing Scapular Retraction  - 5 x daily - 7 x weekly - 1 sets - 5 reps - 5 second hold - Supine Gluteus Stretch  - 2 x daily - 7 x weekly - 1 sets - 5 reps - 20 seconds hold - Full Superman on Table  - 1 x daily - 7 x weekly - 1 sets - 5 reps - 10 seconds hold - Standing Hip Hiking  - 2 x daily - 7 x weekly - 2 sets - 5 reps - 3 seconds hold - Supine Lower Trunk Rotation  - 2 x daily - 7 x weekly - 3-5 reps - 20 seconds hold   ASSESSMENT:   CLINICAL IMPRESSION: Pt arriving to therapy reporting 3/10 pain in her left sided low back. Pt edu in TPDN and wishing to try today. Handout was issued to pt. Pt with good response to needling with twitch response noted. Review of HEP. We added trunk rotation stretch. Continue skilled PT.      OBJECTIVE IMPAIRMENTS decreased activity tolerance,  decreased endurance, decreased knowledge of condition, difficulty walking, decreased ROM, decreased strength, decreased safety awareness, increased fascial restrictions, increased muscle spasms, impaired flexibility, improper body mechanics, postural dysfunction, and pain.    ACTIVITY LIMITATIONS community activity and walking and yoga .    PERSONAL FACTORS CHF, MS, PMR are also affecting patient's functional outcome.      REHAB POTENTIAL: Excellent   CLINICAL DECISION MAKING: Stable/uncomplicated   EVALUATION COMPLEXITY: Low     GOALS: Goals reviewed with patient? Yes   SHORT TERM GOALS: Target date: 03/02/2022    (Remove Blue Hyperlink)   Billijo will be independent in her day 1 HEP. Baseline: Started 02/09/2022 Goal status:  MET 02/28/2022       LONG TERM GOALS: Target date: 03/23/2022  (Remove Blue Hyperlink)   Alicea will report low back pain 0-1/10 on the Numeric Pain Rating Scale. Baseline: 3-4/10 Goal status: INITIAL   2.  Loveda will score at 5 or better on FOTO. Baseline: 55 Goal status: INITIAL   3.  Improve R hip flexors flexibility to 120 degrees; hamstrings to 65 degrees; IR to 20 degrees. Baseline: 115; 60; 10 Goal status: INITIAL   4.  Emilya will be independent with her long-term HEP at [place]. Baseline:  Goal status: INITIAL   PLAN: PT FREQUENCY: 1-2x/week   PT DURATION: 6 weeks   PLANNED INTERVENTIONS: Therapeutic exercises, Therapeutic activity, Neuromuscular re-education, Patient/Family education, Joint mobilization, Dry Needling, Electrical stimulation, Cryotherapy, Moist heat, and Manual therapy.   PLAN FOR NEXT SESSION: assess response to DN, continue with lumbar stretching, core strengthening        Oretha Caprice, PT MPT 02/28/2022, 9:15 AM

## 2022-02-28 ENCOUNTER — Ambulatory Visit: Payer: PPO | Admitting: Physical Therapy

## 2022-02-28 ENCOUNTER — Encounter: Payer: Self-pay | Admitting: Physical Therapy

## 2022-02-28 DIAGNOSIS — R262 Difficulty in walking, not elsewhere classified: Secondary | ICD-10-CM | POA: Diagnosis not present

## 2022-02-28 DIAGNOSIS — M5459 Other low back pain: Secondary | ICD-10-CM

## 2022-02-28 DIAGNOSIS — R293 Abnormal posture: Secondary | ICD-10-CM

## 2022-03-12 ENCOUNTER — Encounter: Payer: PPO | Admitting: Rehabilitative and Restorative Service Providers"

## 2022-03-13 ENCOUNTER — Ambulatory Visit: Payer: PPO

## 2022-03-13 DIAGNOSIS — G4733 Obstructive sleep apnea (adult) (pediatric): Secondary | ICD-10-CM

## 2022-03-13 DIAGNOSIS — R0683 Snoring: Secondary | ICD-10-CM

## 2022-03-14 ENCOUNTER — Encounter: Payer: Self-pay | Admitting: Physical Therapy

## 2022-03-14 ENCOUNTER — Other Ambulatory Visit: Payer: PPO

## 2022-03-14 ENCOUNTER — Ambulatory Visit: Payer: PPO | Admitting: Physical Therapy

## 2022-03-14 DIAGNOSIS — M5459 Other low back pain: Secondary | ICD-10-CM

## 2022-03-14 DIAGNOSIS — G4733 Obstructive sleep apnea (adult) (pediatric): Secondary | ICD-10-CM | POA: Diagnosis not present

## 2022-03-14 DIAGNOSIS — R293 Abnormal posture: Secondary | ICD-10-CM | POA: Diagnosis not present

## 2022-03-14 DIAGNOSIS — R262 Difficulty in walking, not elsewhere classified: Secondary | ICD-10-CM | POA: Diagnosis not present

## 2022-03-14 NOTE — Therapy (Signed)
OUTPATIENT PHYSICAL THERAPY TREATMENT NOTE Discharge    Patient Name: Morgan Herrera MRN: [id number] DOB:06/22/1951, 70 y.o., female Today's Date: 03/14/2022  PCP: Clark, Katherine K, NP REFERRING PROVIDER: Clark, Katherine K, NP  END OF SESSION:   PT End of Session - 03/14/22 0957     Visit Number 3    Number of Visits 12    PT Start Time 0800    PT Stop Time 0845    PT Time Calculation (min) 45 min    Activity Tolerance Patient tolerated treatment well;No increased pain    Behavior During Therapy WFL for tasks assessed/performed              Past Medical History:  Diagnosis Date   (HFpEF) heart failure with preserved ejection fraction (HCC)    a. 03/2018 Echo: EF 55-60%, no rwma. Mod MR. Nl PASP; b. 03/2019 Echo: EF 60-65%, ? DD, mod MR.   Acute CHF (congestive heart failure) (HCC) 04/23/2018   Chest pain    a. 03/2018 Ex MV: rest only imaging showed ant wall perfusion defect. No stress imaging as pt developed hypotension w/ exercise; b. 05/2018 Cath: Nl cors w/ sluggish flow in the LAD suggestive of endothelial dysfxn. EF 55-65%.   Full body hives 09/12/2020   Migraine    Multiple sclerosis (HCC)    Neuromuscular disorder (HCC)    PMR (polymyalgia rheumatica) (HCC)    Pulmonary nodule    a. 03/2018 CTA Chest: 9mm small spiculated nodular density @ the L lung apex. Rec f/u in 3 mos.   Relapsing remitting multiple sclerosis (HCC) 12/13/2015   Past Surgical History:  Procedure Laterality Date   BREAST EXCISIONAL BIOPSY     BREAST SURGERY  1990   COLONOSCOPY WITH PROPOFOL N/A 05/19/2015   Procedure: COLONOSCOPY WITH PROPOFOL;  Surgeon: Paul Y Oh, MD;  Location: [facility];  Service: Gastroenterology;  Laterality: N/A;   EYE SURGERY     1978 1st of 3 eye surgeries   LEFT HEART CATH AND CORONARY ANGIOGRAPHY N/A 04/25/2018   Procedure: LEFT HEART CATH AND CORONARY ANGIOGRAPHY;  Surgeon: Arida, Muhammad A, MD;  Location: [facility];  Service: Cardiovascular;   Laterality: N/A;   TENDON REPAIR     Patient Active Problem List   Diagnosis Date Noted   Loud snoring 02/02/2022   Chronic fatigue 01/30/2022   Chronic left-sided low back pain without sciatica 01/30/2022   Cerumen impaction 07/08/2020   Frequent headaches 07/08/2020   Puncture wound 03/04/2020   Chronic diastolic heart failure (HCC) 05/07/2018   Shortness of breath 04/30/2018   Unstable angina (HCC)    PMR (polymyalgia rheumatica) (HCC) 04/23/2018   Welcome to Medicare preventive visit 04/10/2017   Constipation 03/19/2016   Insomnia 09/19/2015   Encounter for annual general medical examination with abnormal findings in adult 03/04/2015   Vitamin D deficiency 03/04/2015   Multiple sclerosis (HCC) 02/11/2015   Depression 02/11/2015    REFERRING DIAG: M54.50,G89.29 (ICD-10-CM) - Chronic left-sided low back pain without sciatica  THERAPY DIAG:  Difficulty in walking, not elsewhere classified  Abnormal posture  Other low back pain  Rationale for Evaluation and Treatment Rehabilitation  PERTINENT HISTORY: CHF, MS, PMR  PRECAUTIONS: no surgical precautions   SUBJECTIVE: Pt arriving today with no pain. Pt still reporting more soreness following exercising at gym.   PAIN:  Are you having pain? no   OBJECTIVE: (objective measures completed at initial evaluation unless otherwise dated)   DIAGNOSTIC FINDINGS:  FINDINGS: There   is no evidence of lumbar spine fracture. Alignment is normal. Narrow intervertebral space is identified more prominently involving the L5-S1 and to a lesser degree at L4-5, and L3-4.   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   PATIENT SURVEYS:  FOTO 30 (Goal 78 in 10 visits) FOTO: 94% on 03/14/22   SCREENING FOR RED FLAGS: Bowel or bladder incontinence: No Spinal tumors: No Cauda equina syndrome: No Compression fracture: No Abdominal aneurysm: No   COGNITION:           Overall cognitive status: Within  functional limits for tasks assessed                          SENSATION: Not tested   MUSCLE LENGTH: Hamstrings: Right 60 deg; Left 65 deg     POSTURE:  Mild forward head, IR and protracted shoulders and decreased lumbar lordosis   PALPATION: Some muscle guarding of the Left L4-S1 paraspinals   LUMBAR ROM:    Active  A/PROM  02/09/2022 [place] 03/14/22  Flexion     Extension 20 30  Right lateral flexion     Left lateral flexion     Right rotation     Left rotation      (Blank rows = not tested)   LE ROM:   Passive  Right 02/09/2022 Left 02/09/2022 Rt/Left 03/14/2022  Hip flexion 115 120 140/138  Hip extension       Hip abduction       Hip adduction       Hip internal rotation 10 20 40/46   Hip external rotation 40 42 58/60  Knee flexion       Knee extension       Ankle dorsiflexion       Ankle plantarflexion       Ankle inversion       Ankle eversion        (Blank rows = not tested)   LE MMT:   Grossly bilateral hip strength 5/5 03/14/2022   TODAY'S TREATMENT  03/14/2022:  TherEx:   Prone: opposite arm/leg x 10 each  Prone: press ups x 3 holding 20 seconds Trunk rotation: x 3 holding 30 seconds figure 4 position Hamstring stretch x 2 holding 30 seconds each LE Piriformis stretch: x 2 holding 60 seconds each LE Cat/Cow : x 10 holding 5-10 seconds Quadraped: opposite arm/leg holding 10 seconds each x 5  Trigger Point Dry-Needling  Treatment instructions: Expect mild to moderate muscle soreness. S/S of pneumothorax if dry needled over a lung field, and to seek immediate medical attention should they occur. Patient verbalized understanding of these instructions and education.  Patient Consent Given: Yes Education handout provided: Yes Muscles treated: left lumbar paraspinals, left glute medius, left piriformis Electrical stimulation performed: No Parameters: N/A Treatment response/outcome: twitch response noted in all muscles Pt with normal response to  treatment    02/28/2022: TherEx:  Nustep: Level 5 x 7 minutes  Prone: opposite arm/leg x 10 each  Prone: press ups x 3 holding 20 seconds Trunk rotation: x 3 holding 30 seconds Hamstring stretch x 3 holding 30 seconds each LE Piriformis stretch: x 1 holding 60 seconds each LE Trigger Point Dry-Needling  Treatment instructions: Expect mild to moderate muscle soreness. S/S of pneumothorax if dry needled over a lung field, and to seek immediate medical attention should they occur. Patient verbalized understanding of these instructions and education.  Patient Consent Given: Yes Education handout  provided: Yes Muscles treated: left lumbar paraspinals, left glute medius, left piriformis Electrical stimulation performed: No Parameters: N/A Treatment response/outcome: twitch response noted in all muscles Pt with normal response to treatment     01/30/2022: Lumbar extension 10X 3 seconds Shoulder blade pinches 10X 5 seconds Gluteal stretch 4X 20 seconds Prone superman 10X 5 seconds Hip hike in doorframe 5X 3 seconds   Therapeutic Activities: Review exam findings, imaging, spine anatomy education with spine model, review HEP     PATIENT EDUCATION:  Education details: See above Person educated: Patient Education method: Explanation, Demonstration, Tactile cues, Verbal cues, and Handouts Education comprehension: verbalized understanding, returned demonstration, verbal cues required, tactile cues required, and needs further education     HOME EXERCISE PROGRAM: Access Code: [email or web address] URL: [email or web address] Date: 03/14/2022 Prepared by: Kearney Hard  Exercises - Standing Lumbar Extension at [place]  - 5 x daily - 7 x weekly - 1 sets - 5 reps - 3 seconds hold - Standing Scapular Retraction  - 5 x daily - 7 x weekly - 1 sets - 5 reps - 5 second hold - Supine Gluteus Stretch  - 2 x daily - 7 x weekly - 1 sets - 5 reps - 20 seconds hold - Full Superman on  Table  - 1 x daily - 7 x weekly - 1 sets - 5 reps - 10 seconds hold - Standing Hip Hiking  - 2 x daily - 7 x weekly - 2 sets - 5 reps - 3 seconds hold - Supine Lower Trunk Rotation  - 2 x daily - 7 x weekly - 3-5 reps - 20 seconds hold - Cat Cow to Child's Pose  - 1 x daily - 7 x weekly - 5 reps - 10 seconds hold - [place]  - 1 x daily - 7 x weekly - 10 reps - 5-10 seconds hold - Supine Piriformis Stretch  - 1 x daily - 7 x weekly - 3-5 reps - 20 seconds hold - Supine Figure 4 Piriformis Stretch  - 1 x daily - 7 x weekly - 3 reps - 20 seconds hold - Standing Row with Anchored Resistance  - 1 x daily - 7 x weekly - 2 sets - 10 reps - 3 seconds hold ASSESSMENT:   CLINICAL IMPRESSION: Pt arriving today reporting no pain. Pt still with some mild soreness following gym exercises or classes. Pt has met all goals and her FOTO score has improved to 94%. Pt is being discharged from skilled PT services.      OBJECTIVE IMPAIRMENTS decreased activity tolerance, decreased endurance, decreased knowledge of condition, difficulty walking, decreased ROM, decreased strength, decreased safety awareness, increased fascial restrictions, increased muscle spasms, impaired flexibility, improper body mechanics, postural dysfunction, and pain.    ACTIVITY LIMITATIONS community activity and walking and yoga .    PERSONAL FACTORS CHF, MS, PMR are also affecting patient's functional outcome.      REHAB POTENTIAL: Excellent   CLINICAL DECISION MAKING: Stable/uncomplicated   EVALUATION COMPLEXITY: Low     GOALS: Goals reviewed with patient? Yes   SHORT TERM GOALS: Target date: 03/02/2022    (Remove Blue Hyperlink)   Jonea will be independent in her day 1 HEP. Baseline: Started 02/09/2022 Goal status:  MET 02/28/2022       LONG TERM GOALS: Target date: 03/23/2022  (Remove Blue Hyperlink)   [place] will report low back pain 0-1/10 on the Numeric Pain Rating Scale. Baseline: 3-4/10 Goal status: Met 03/14/22  2.  Anjenette will score at 78 or better on FOTO. Baseline: 55 Goal status: MET 03/14/2022   3.  Improve R hip flexors flexibility to 120 degrees; hamstrings to 65 degrees; IR to 20 degrees. Baseline: 115; 60; 10 Goal status: Met 03/14/22   4.  Winni will be independent with her long-term HEP at [place]. Baseline:  Goal status: MET 03/14/2022   PLAN: PT FREQUENCY: 1-2x/week   PT DURATION: 6 weeks   PLANNED INTERVENTIONS: Therapeutic exercises, Therapeutic activity, Neuromuscular re-education, Patient/Family education, Joint mobilization, Dry Needling, Electrical stimulation, Cryotherapy, Moist heat, and Manual therapy.   PLAN FOR NEXT SESSION : discharge   PHYSICAL THERAPY DISCHARGE SUMMARY  Visits from Start of Care: 3  Current functional level related to goals / functional outcomes: See above   Remaining deficits: See above   Education / Equipment: HEP   Patient agrees to discharge. Patient goals were met. Patient is being discharged due to being pleased with the current functional level.and all goals met.       Jennifer R Martin, PT MPT 03/14/2022, 9:59 AM    

## 2022-03-14 NOTE — Progress Notes (Signed)
NEUROLOGY FOLLOW UP OFFICE NOTE  Morgan Herrera [id number]  Assessment/Plan:  Multiple sclerosis - stable clinically and on imaging.  doing well off DMT   Repeat MRI of brain with and without contrast in 6 months Continue D3 4000 IU daily Continue gabapentin Follow up 6 months (after repeat MRI)   Subjective:  Morgan Herrera is a 71 year old right-handed woman with multiple sclerosis, polymyalgia rheumatica and migraine who follows up for multiple sclerosis.   UPDATE: Current medications:  B12, gabapentin '100mg'$  TID PRN; Lexapro '10mg'$  daily; Ambien; D3 4000 IU daily.     Due to patient's age and stability of disease, planned to discontinue DMT.  MRI of brain with and without contrast on 09/05/2021 personally reviewed demonstrated unchanged distribution of chronic demyelinating lesions.  She discontinued Aubagio.      She is doing well.  No new symptoms.   Vision: Chronic right sided peripheral vision loss.   Motor:  No concerns Sensory:  fingers feel tingling and itchiness.   Pain:  Neck and shoulder pain/aches.  Takes magnesium for cramps in feet, which help. Gait:  Sometimes may feel briefly off balance.   Bowel/Bladder:  No concerns Fatigue:  Sometimes has episodes of lethargy. Cognition:  No concerns Mood:  Some stress.   Tension-type headaches: They are mild to moderate occipital/biptemporal pressure headaches occurring 3 to 4 days and resolves for a couple of days.  Treats with Aleve or Tylenol (only takes one day a week).  At one time, stabbing pain in back of right ear which resolved.    Vit D from last month was 72.  HISTORY: She was diagnosed with MS at age 56, when she presented with bilateral optic neuritis, right worse than left.  She was treated with IV steroids at that time..  In hindsight, she recalls brief episodes from her past.  In her late-20s, she had a 3 week episode of dizziness and ataxia.  She had brief episodes of limb paresthesias lasting a couple of  days.   Work up included MRIs.  MRIs of the brain have shown lesions suspicious for MS.  Prior cervical MRI reportedly normal.   She never had a relapse and therefore never required further IV steroids.  However, she was diagnosed with polymyalgia rheumatica several years ago, after experiencing severe joint pain and stiffness.     She occasionally has fatigue once in a while, sometimes lasting for 2 weeks.   She does not exhibit Lhermitte's sign.  She reports burning and aching on the bottom of her feet when she walks for prolonged period.   She works part-time as a Radiation protection practitioner.  For the past couple of years, she reports word-finding difficulty.  Sometimes it is difficult to concentrate.  It has almost affected her work at times.  She has a BA in [place].  She underwent neuropsychological testing in July 2017, which revealed anxiety and depression but no cognitive impairment.   Prior disease modifying drugs:  Avonex (tired of injections), Tecfidera (nausea, flushing and unable to take aspirin), Aubagio (effective, tolerated)   09/06/2015:  MRI BRAIN W WO: chronic non-enhancing T2/FLAIR hyperintensities in the periventricular white matter, some demonstrating Dawson's fingers, with mild corpus callosum involvement, which appears stable compared to 2012 and similar to imaging from 2005.   10/09/2016:  MRI BRAIN W WO:  stable. 12/28/2017:  MRI BRAIN W WO:  stable compared to prior imaging from 2016. 04/07/2020 MRI BRAIN W WO:  Stable mild white matter disease when  compared with 2019. 04/07/2020 MRI CERVICAL SPINE W WO:  1.  Normal MRI of the cord.  2.  Cervical spine degeneration with foraminal narrowings as described.  Diffusely patent spinal canal.  PAST MEDICAL HISTORY: Past Medical History:  Diagnosis Date   (HFpEF) heart failure with preserved ejection fraction ([place])    a. 03/2018 Echo: EF 55-60%, no rwma. Mod MR. Nl PASP; b. 03/2019 Echo: EF 60-65%, ? DD, mod MR.   Acute CHF (congestive heart  failure) ([place]) 04/23/2018   Chest pain    a. 03/2018 Ex MV: rest only imaging showed ant wall perfusion defect. No stress imaging as pt developed hypotension w/ exercise; b. 05/2018 Cath: Nl cors w/ sluggish flow in the LAD suggestive of endothelial dysfxn. EF 55-65%.   Full body hives 09/12/2020   Migraine    Multiple sclerosis (HCC)    Neuromuscular disorder (HCC)    PMR (polymyalgia rheumatica) (HCC)    Pulmonary nodule    a. 03/2018 CTA Chest: 50m small spiculated nodular density @ the L lung apex. Rec f/u in 3 mos.   [place] remitting [place] 12/13/2015    MEDICATIONS: Current Outpatient Medications on File Prior to Visit  Medication Sig Dispense Refill   albuterol (VENTOLIN HFA) 108 (90 Base) MCG/ACT inhaler Inhale 1-2 puffs into the lungs every 6 (six) hours as needed for wheezing or shortness of breath. 1 g 1   calcium carbonate (OS-CAL - DOSED IN MG OF ELEMENTAL CALCIUM) 1250 (500 Ca) MG tablet Take 1 tablet by mouth daily.     cholecalciferol (VITAMIN D) 1000 units tablet Take 2,000 Units by mouth daily.     cyanocobalamin 500 MCG tablet Take 500 mcg by mouth daily.     escitalopram (LEXAPRO) 10 MG tablet TAKE 1 TABLET BY MOUTH DAILY FOR DEPRESSION. 90 tablet 3   furosemide (LASIX) 20 MG tablet Take 1 tablet (20 mg total) by mouth daily. Office visit required for further refills. 90 tablet 0   gabapentin (NEURONTIN) 100 MG capsule TAKE 1 CAPSULE BY MOUTH THREE TIMES A DAY 270 capsule 2   Magnesium 250 MG TABS Take by mouth.     zolpidem (AMBIEN) 10 MG tablet Take 1 tablet (10 mg total) by mouth at bedtime as needed for sleep. (Patient taking differently: Take 5 mg by mouth at bedtime as needed for sleep.) 30 tablet 0   No current facility-administered medications on file prior to visit.    ALLERGIES: No Known Allergies  FAMILY HISTORY: Family History  Problem Relation Age of Onset   Heart disease Mother    Hypertension Mother    Parkinson's disease Mother     Parkinsonism Mother    Cancer Maternal Grandmother        breast   Breast cancer Maternal Grandmother    Asthma Father       Objective:  Blood pressure 122/79, pulse 88, resp. rate 18, height '5\' 6"'$  (1.676 m), weight 122 lb (55.3 kg), SpO2 96 %. General: No acute distress.  Patient appears well-groomed.   Head:  Normocephalic/atraumatic Eyes:  Fundi examined but not visualized Neck: supple, no paraspinal tenderness, full range of motion Heart:  Regular rate and rhythm Lungs:  Clear to auscultation bilaterally Back: No paraspinal tenderness Neurological Exam: alert and oriented to person, place, and time.  Speech fluent and not dysarthric, language intact.  Peripheral vision loss in right eye.  Otherwise, CN II-XII intact. Bulk and tone normal, muscle strength 5/5 throughout.  Sensation to temperature  and vibration intact.  Deep tendon reflexes 2+ throughout, toes downgoing.  Finger to nose testing intact.  Gait normal, Romberg negative.   Metta Clines, DO  CC: Alma Friendly, NP

## 2022-03-15 ENCOUNTER — Encounter: Payer: Self-pay | Admitting: Neurology

## 2022-03-15 ENCOUNTER — Ambulatory Visit: Payer: PPO | Admitting: Neurology

## 2022-03-15 VITALS — BP 122/79 | HR 88 | Resp 18 | Ht 66.0 in | Wt 122.0 lb

## 2022-03-15 DIAGNOSIS — G35 Multiple sclerosis: Secondary | ICD-10-CM

## 2022-03-15 NOTE — Patient Instructions (Signed)
Repeat MRI of brain with and without contrast in 6 months Continue D3 4000 IU daily Follow up in 6 months (after repeat MRI)

## 2022-03-19 ENCOUNTER — Encounter: Payer: PPO | Admitting: Rehabilitative and Restorative Service Providers"

## 2022-03-23 ENCOUNTER — Telehealth: Payer: Self-pay | Admitting: Primary Care

## 2022-03-27 NOTE — Telephone Encounter (Signed)
Called and spoke with pt letting her know the results of HST and recs per BW. Pt verbalized understanding and has been scheduled for an appt with BW. Nothing further needed.

## 2022-04-10 ENCOUNTER — Ambulatory Visit: Payer: PPO | Admitting: Primary Care

## 2022-04-10 ENCOUNTER — Encounter: Payer: Self-pay | Admitting: Primary Care

## 2022-04-10 VITALS — BP 112/72 | HR 56 | Temp 97.9°F | Ht 66.0 in | Wt 121.0 lb

## 2022-04-10 DIAGNOSIS — R0683 Snoring: Secondary | ICD-10-CM

## 2022-04-10 DIAGNOSIS — R0982 Postnasal drip: Secondary | ICD-10-CM

## 2022-04-10 DIAGNOSIS — G4733 Obstructive sleep apnea (adult) (pediatric): Secondary | ICD-10-CM | POA: Diagnosis not present

## 2022-04-10 NOTE — Progress Notes (Signed)
$'@Patient'j$  ID: Glade Nurse, female    DOB: 09-Mar-1951, 71 y.o.   MRN: [id number]  Chief Complaint  Patient presents with   Follow-up    She is here to go over sleep study.     Referring provider: Pleas Koch, NP  HPI: 71 year old female, former smoker quit in 2000.  Past medical history significant for chronic diastolic heart failure, multiple sclerosis, chronic fatigue, depression, insomnia, vitamin D deficiency, and PMR.  Previous [facility] pulmonary encounter 02/02/2022 Patient presents today for sleep consult. She has symptoms of loud snoring, wakes of gasping for air at night, witnessed apnea and daytime sleepiness.  Her husband has told her that if she sleeps on her back her snoring and apnea are worse. Daytime drowsiness has been more noticeable the last couple of months. Most days she needs to take a mid afternoon. If she falls asleep on the couch she will wake up very suddenly after falling asleep. She is active throughout the day, walks and goes to silver sneakers. Typical bedtime is between 9 and 10 PM.  She takes 5 mg of Ambien as needed at bedtime. It takes her approximately 30 minutes to fall asleep.  She wakes up once at night.  She starts her day at 6 AM.  Her weight is up 5 pounds in the last 2 years.  She has never had a prior sleep study.  She is not on CPAP.  She does not wear oxygen.  Denies symptoms of narcolepsy, cataplexy or sleepwalking.  Epworth 9.  04/10/2022- interim hx  Patient presents today for a 4 to 8-week follow-up to review sleep study results.  Patient has symptoms of loud snoring and witnessed apnea along with some associated daytime sleepiness. She also has postnasal drip symptoms at night. Home sleep study on 03/13/2022 showed moderate obstructive sleep apnea, AHI 16.2/hour with SPO2 low 76% (average 91%).  We discussed sleep study results today along with possible treatment options.  She would like to try oral appliance along with wedge pillow for  treatment of her mild to moderate sleep apnea.     No Known Allergies  Immunization History  Administered Date(s) Administered   Fluad Quad(high Dose 65+) 06/11/2019   Influenza, High Dose Seasonal PF 06/13/2020, 06/28/2021   Influenza,inj,Quad PF,6+ Mos 06/26/2018   Influenza-Unspecified 06/15/2020   PFIZER Comirnaty(Gray Top)Covid-19 Tri-Sucrose Vaccine 01/02/2021   PFIZER(Purple Top)SARS-COV-2 Vaccination 10/26/2019, 11/16/2019, 06/27/2020   PPD Test 06/15/2016   Pfizer Covid-19 Vaccine Bivalent Booster 64yr & up 08/15/2021   Pneumococcal Conjugate-13 04/10/2017   Pneumococcal Polysaccharide-23 05/26/2015   Td 11/30/2014   Zoster Recombinat (Shingrix) 07/28/2019, 01/25/2020   Zoster, Live 10/01/2010    Past Medical History:  Diagnosis Date   (HFpEF) heart failure with preserved ejection fraction ([place]    a. 03/2018 Echo: EF 55-60%, no rwma. Mod MR. Nl PASP; b. 03/2019 Echo: EF 60-65%, ? DD, mod MR.   Acute CH[place] (congestive heart failure) ([place] 04/23/2018   Chest pain    a. 03/2018 Ex MV: rest only imaging showed ant wall perfusion defect. No stress imaging as pt developed hypotension w/ exercise; b. 05/2018 Cath: Nl cors w/ sluggish flow in the LAD suggestive of endothelial dysfxn. EF 55-65%.   Full body hives 09/12/2020   Migraine    Multiple sclerosis (HCC)    Neuromuscular disorder (HCC)    PMR (polymyalgia rheumatica) (HCC)    Pulmonary nodule    a. 03/2018 CTA Chest: 956msmall spiculated nodular density @ the L  lung apex. Rec f/u in 3 mos.   Relapsing remitting multiple sclerosis ([place]) 12/13/2015    Tobacco History: Social History   Tobacco Use  Smoking Status Former   Packs/day: 1.00   Years: 45.00   Total pack years: 45.00   Types: Cigarettes   Quit date: 2000   Years since quitting: 23.5  Smokeless Tobacco Never   Counseling given: Not Answered   Outpatient Medications Prior to Visit  Medication Sig Dispense Refill   albuterol (VENTOLIN HFA) 108 (90 Base)  MCG/ACT inhaler Inhale 1-2 puffs into the lungs every 6 (six) hours as needed for wheezing or shortness of breath. 1 g 1   calcium carbonate (OS-CAL - DOSED IN MG OF ELEMENTAL CALCIUM) 1250 (500 Ca) MG tablet Take 1 tablet by mouth daily.     cholecalciferol (VITAMIN D) 1000 units tablet Take 2,000 Units by mouth daily.     cyanocobalamin 500 MCG tablet Take 500 mcg by mouth daily.     escitalopram (LEXAPRO) 10 MG tablet TAKE 1 TABLET BY MOUTH DAILY FOR DEPRESSION. 90 tablet 3   furosemide (LASIX) 20 MG tablet Take 1 tablet (20 mg total) by mouth daily. Office visit required for further refills. 90 tablet 0   gabapentin (NEURONTIN) 100 MG capsule TAKE 1 CAPSULE BY MOUTH THREE TIMES A DAY 270 capsule 2   Magnesium 250 MG TABS Take by mouth.     zolpidem (AMBIEN) 10 MG tablet Take 1 tablet (10 mg total) by mouth at bedtime as needed for sleep. (Patient taking differently: Take 5 mg by mouth at bedtime as needed for sleep.) 30 tablet 0   No facility-administered medications prior to visit.    Review of Systems  Review of Systems  Constitutional:  Positive for fatigue.  HENT:  Positive for postnasal drip.   Respiratory:  Positive for apnea.   Psychiatric/Behavioral:  Positive for sleep disturbance.      Physical Exam  BP 112/72 (BP Location: Left Arm, Patient Position: Sitting)   Pulse (!) 56   Temp 97.9 F (36.6 C) (Oral)   Ht '5\' 6"'$  (1.676 m)   Wt 121 lb (54.9 kg)   SpO2 96%   BMI 19.53 kg/m  Physical Exam Constitutional:      Appearance: Normal appearance.  HENT:     Head: Normocephalic and atraumatic.     Mouth/Throat:     Mouth: Mucous membranes are moist.     Pharynx: Oropharynx is clear.  Cardiovascular:     Rate and Rhythm: Normal rate and regular rhythm.  Pulmonary:     Effort: Pulmonary effort is normal.     Breath sounds: Normal breath sounds.  Musculoskeletal:        General: Normal range of motion.  Skin:    General: Skin is warm and dry.  Neurological:      General: No focal deficit present.     Mental Status: She is alert and oriented to person, place, and time. Mental status is at baseline.  Psychiatric:        Mood and Affect: Mood normal.        Behavior: Behavior normal.        Judgment: Judgment normal.      Lab Results:  CBC    Component Value Date/Time   WBC 6.0 01/30/2022 1440   RBC 4.55 01/30/2022 1440   HGB 13.1 01/30/2022 1440   HGB 12.5 12/24/2019 0000   HCT 39.8 01/30/2022 1440   HCT 38.4 12/24/2019 0000  PLT 236.0 01/30/2022 1440   MCV 87.6 01/30/2022 1440   MCV 88 12/24/2019 0000   MCH 28.5 12/24/2019 0000   MCH 28.0 07/18/2018 0947   MCHC 33.0 01/30/2022 1440   RDW 14.4 01/30/2022 1440   RDW 13.3 12/24/2019 0000   LYMPHSABS 1.0 08/07/2021 1010   LYMPHSABS 1.1 12/24/2019 0000   MONOABS 0.6 08/07/2021 1010   EOSABS 0.1 08/07/2021 1010   EOSABS 0.2 12/24/2019 0000   BASOSABS 0.1 08/07/2021 1010   BASOSABS 0.1 12/24/2019 0000    BMET    Component Value Date/Time   NA 140 08/07/2021 1010   NA 142 05/15/2018 1515   K 4.7 08/07/2021 1010   CL 102 08/07/2021 1010   CO2 30 08/07/2021 1010   GLUCOSE 97 08/07/2021 1010   BUN 22 08/07/2021 1010   BUN 26 05/15/2018 1515   CREATININE 0.81 08/07/2021 1010   CREATININE 0.78 12/24/2019 1334   CALCIUM 9.7 08/07/2021 1010   GFRNONAA 78 12/24/2019 1334   GFRAA 91 12/24/2019 1334    BNP    Component Value Date/Time   BNP 483.0 (H) 04/23/2018 1744    ProBNP    Component Value Date/Time   PROBNP 161.0 (H) 05/02/2018 0758    Imaging: No results found.   Assessment & Plan:   Moderate obstructive sleep apnea - Patient has symptoms of loud snoring and witnessed apnea. Epworth 9/24. HST on 03/13/2022 showed moderate OSA, AHI 16.2/hour with SPO2 low 76% (average 91%).  We discussed sleep study results today along with possible treatment options.  She would like to try oral appliance along with wedge pillow for treatment of her mild to moderate sleep apnea.  Advised against driving if experiencing excessive daytime sleepiness. We will placed referral order. FU in 6 months or sooner if needed.   PND (post-nasal drip) - Advised patient use OTC flonase nasal spray. May want to see ENT if symptoms persist.    Martyn Ehrich, NP 04/16/2022

## 2022-04-10 NOTE — Patient Instructions (Signed)
Home sleep study on 03/13/2022 showed moderate obstructive sleep apnea, AHI (apnea-hypopnea index) 16.2/hour with SPO2 low 76% (average 91%).  Events were primarily in supine (back) position.  Recommend trying oral appliance along with wedge pillow to help cut down on apneas.  Do not drive experiencing excessive daytime sleepiness or fatigue.   Mild OSA 5-15 apneic events an hour Moderate OSA 15-30 apneic events an hour Severe OSA > 30 apneic events an hour  Referral: Orthodontics re: moderate OSA for oral appliance  Dr. Augustina Mood phone number [phone]  Follow-up: 6 months with [facility] NP   Sleep Apnea Sleep apnea affects breathing during sleep. It causes breathing to stop for 10 seconds or more, or to become shallow. People with sleep apnea usually snore loudly. It can also increase the risk of: Heart attack. Stroke. Being very overweight (obese). Diabetes. Heart failure. Irregular heartbeat. High blood pressure. The goal of treatment is to help you breathe normally again. What are the causes?  The most common cause of this condition is a collapsed or blocked airway. There are three kinds of sleep apnea: Obstructive sleep apnea. This is caused by a blocked or collapsed airway. Central sleep apnea. This happens when the brain does not send the right signals to the muscles that control breathing. Mixed sleep apnea. This is a combination of obstructive and central sleep apnea. What increases the risk? Being overweight. Smoking. Having a small airway. Being older. Being female. Drinking alcohol. Taking medicines to calm yourself (sedatives or tranquilizers). Having family members with the condition. Having a tongue or tonsils that are larger than normal. What are the signs or symptoms? Trouble staying asleep. Loud snoring. Headaches in the morning. Waking up gasping. Dry mouth or sore throat in the morning. Being sleepy or tired during the day. If you are sleepy or  tired during the day, you may also: Not be able to focus your mind (concentrate). Forget things. Get angry a lot and have mood swings. Feel sad (depressed). Have changes in your personality. Have less interest in sex, if you are female. Be unable to have an erection, if you are female. How is this treated?  Sleeping on your side. Using a medicine to get rid of mucus in your nose (decongestant). Avoiding the use of alcohol, medicines to help you relax, or certain pain medicines (narcotics). Losing weight, if needed. Changing your diet. Quitting smoking. Using a machine to open your airway while you sleep, such as: An oral appliance. This is a mouthpiece that shifts your lower jaw forward. A CPAP device. This device blows air through a mask when you breathe out (exhale). An EPAP device. This has valves that you put in each nostril. A BIPAP device. This device blows air through a mask when you breathe in (inhale) and breathe out. Having surgery if other treatments do not work. Follow these instructions at home: Lifestyle Make changes that your doctor recommends. Eat a healthy diet. Lose weight if needed. Avoid alcohol, medicines to help you relax, and some pain medicines. Do not smoke or use any products that contain nicotine or tobacco. If you need help quitting, ask your doctor. General instructions Take over-the-counter and prescription medicines only as told by your doctor. If you were given a machine to use while you sleep, use it only as told by your doctor. If you are having surgery, make sure to tell your doctor you have sleep apnea. You may need to bring your device with you. Keep all follow-up visits.  Contact a doctor if: The machine that you were given to use during sleep bothers you or does not seem to be working. You do not get better. You get worse. Get help right away if: Your chest hurts. You have trouble breathing in enough air. You have an uncomfortable feeling  in your back, arms, or stomach. You have trouble talking. One side of your body feels weak. A part of your face is hanging down. These symptoms may be an emergency. Get help right away. Call your local emergency services (911 in the [place] Do not wait to see if the symptoms will go away. Do not drive yourself to the hospital. Summary This condition affects breathing during sleep. The most common cause is a collapsed or blocked airway. The goal of treatment is to help you breathe normally while you sleep. This information is not intended to replace advice given to you by your health care provider. Make sure you discuss any questions you have with your health care provider. Document Revised: 04/26/2021 Document Reviewed: 08/26/2020 Elsevier Patient Education  [employer] [place] [employer].

## 2022-04-16 DIAGNOSIS — G4733 Obstructive sleep apnea (adult) (pediatric): Secondary | ICD-10-CM | POA: Insufficient documentation

## 2022-04-16 DIAGNOSIS — R0982 Postnasal drip: Secondary | ICD-10-CM | POA: Insufficient documentation

## 2022-04-16 HISTORY — DX: Postnasal drip: R09.82

## 2022-04-16 NOTE — Assessment & Plan Note (Signed)
-   Advised patient use OTC flonase nasal spray. May want to see ENT if symptoms persist.

## 2022-04-16 NOTE — Assessment & Plan Note (Signed)
-   Patient has symptoms of loud snoring and witnessed apnea. Epworth 9/24. HST on 03/13/2022 showed moderate OSA, AHI 16.2/hour with SPO2 low 76% (average 91%).  We discussed sleep study results today along with possible treatment options.  She would like to try oral appliance along with wedge pillow for treatment of her mild to moderate sleep apnea. Advised against driving if experiencing excessive daytime sleepiness. We will placed referral order. FU in 6 months or sooner if needed.

## 2022-06-08 ENCOUNTER — Other Ambulatory Visit: Payer: Self-pay | Admitting: Primary Care

## 2022-06-08 DIAGNOSIS — R609 Edema, unspecified: Secondary | ICD-10-CM

## 2022-08-09 NOTE — Progress Notes (Signed)
Cardiology Office Note    Date:  08/10/2022   ID:  Morgan Herrera, DOB 1950/12/10, MRN [id number]  PCP:  Pleas Koch, NP  Cardiologist:  Kathlyn Sacramento, MD  Electrophysiologist:  None   Chief Complaint: Follow-up  History of Present Illness:   Morgan Herrera is a 71 y.o. 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  She was admitted in 03/2018 with fatigue, shortness of breath, and chest pain.  D-dimer was elevated.  CTA of the chest showed no evidence of PE.  BNP was mildly elevated.  Echo demonstrated a preserved LV systolic function.  Stress test was aborted due to hypertensive response with exercise.  LHC showed normal coronary arteries with sluggish flow in the LAD suggestive of endothelial dysfunction.  EF was normal.  LVEDP was mildly elevated.  She did not tolerate long-acting nitroglycerin due to headache.  PFTs in 07/2018 showed no evidence of obstructive or restrictive lung disease.  Repeat echo in 03/2019 showed normal LV systolic function and diastolic function with mild to moderate mitral regurgitation.  She underwent Zio patch in 08/2020, due to intermittent palpitations, which showed a normal sinus rhythm with an average heart rate of 58 bpm with a low heart rate of 39 bpm in the morning, 166 episodes of SVT with the longest episode lasting 3 minutes and 15 seconds with an average rate of 115 bpm and correlated with symptoms.  She was last seen in the office in 10/2020 and reported her palpitations were overall short-lived and did not significantly interfere with her activities.  She remained very active.  She comes in today and is without symptoms of angina or decompensation.  She does continue to note stable chronic fatigue, dyspnea, back pain, and posterior shoulder/neck discomfort.   Symptoms are unchanged over the past several years.  She has been diagnosed with sleep apnea and is currently managing this with a wedge pillow.  She remains active walking on a regular basis and participating in yoga.  No dizziness, presyncope, or syncope.   Labs independently reviewed: 01/2022 - Hgb 13.1, PLT 236, TSH normal, TC 232, TG 140, HDL 87, LDL 116 08/2021 - potassium 4.7, BUN 22, serum creatinine 0.81, albumin 4.6, AST/ALT normal  Past Medical History:  Diagnosis Date   (HFpEF) heart failure with preserved ejection fraction ([place])    a. 03/2018 Echo: EF 55-60%, no rwma. Mod MR. Nl PASP; b. 03/2019 Echo: EF 60-65%, ? DD, mod MR.   Acute CHF (congestive heart failure) ([place]) 04/23/2018   Chest pain    a. 03/2018 Ex MV: rest only imaging showed ant wall perfusion defect. No stress imaging as pt developed hypotension w/ exercise; b. 05/2018 Cath: Nl cors w/ sluggish flow in the LAD suggestive of endothelial dysfxn. EF 55-65%.   Full body hives 09/12/2020   Migraine    Multiple sclerosis (HCC)    Neuromuscular disorder (HCC)    PMR (polymyalgia rheumatica) (HCC)    Pulmonary nodule    a. 03/2018 CTA Chest: 61m small spiculated nodular density @ the L lung apex. Rec f/u in 3 mos.   [place] 12/13/2015    Past Surgical History:  Procedure Laterality Date   BREAST EXCISIONAL BIOPSY     BREAST SURGERY  1990   COLONOSCOPY WITH PROPOFOL N/A 05/19/2015   Procedure: COLONOSCOPY WITH PROPOFOL;  Surgeon: Hulen Luster, MD;  Location: [facility] ENDOSCOPY;  Service: Gastroenterology;  Laterality: N/A;   EYE SURGERY     1978 1st of 3 eye surgeries   LEFT HEART CATH AND CORONARY ANGIOGRAPHY N/A 04/25/2018   Procedure: LEFT HEART CATH AND CORONARY ANGIOGRAPHY;  Surgeon: Wellington Hampshire, MD;  Location: [facility];  Service: Cardiovascular;  Laterality: N/A;   TENDON REPAIR      Current Medications: Current Meds  Medication Sig   albuterol (VENTOLIN HFA) 108  (90 Base) MCG/ACT inhaler Inhale 1-2 puffs into the lungs every 6 (six) hours as needed for wheezing or shortness of breath.   calcium carbonate (OS-CAL - DOSED IN MG OF ELEMENTAL CALCIUM) 1250 (500 Ca) MG tablet Take 1 tablet by mouth daily.   cholecalciferol (VITAMIN D) 1000 units tablet Take 2,000 Units by mouth daily.   cyanocobalamin 500 MCG tablet Take 500 mcg by mouth daily.   escitalopram (LEXAPRO) 10 MG tablet TAKE 1 TABLET BY MOUTH DAILY FOR DEPRESSION.   furosemide (LASIX) 20 MG tablet Take 1 tablet (20 mg total) by mouth daily.   gabapentin (NEURONTIN) 100 MG capsule TAKE 1 CAPSULE BY MOUTH THREE TIMES A DAY   Magnesium 250 MG TABS Take by mouth.   zolpidem (AMBIEN) 10 MG tablet Take 1 tablet (10 mg total) by mouth at bedtime as needed for sleep. (Patient taking differently: Take 5 mg by mouth at bedtime as needed for sleep.)    Allergies:   Patient has no known allergies.   Social History   Socioeconomic History   Marital status: Married    Spouse name: Not on file   Number of children: 1   Years of education: Not on file   Highest education level: Bachelor's degree (e.g., BA, AB, BS)  Occupational History   Not on file  Tobacco Use   Smoking status: Former    Packs/day: 1.00    Years: 45.00    Total pack years: 45.00    Types: Cigarettes    Quit date: 2000    Years since quitting: 23.8   Smokeless tobacco: Never  Vaping Use   Vaping Use: Never used  Substance and Sexual Activity   Alcohol use: Yes    Alcohol/week: 0.0 standard drinks of alcohol    Comment: 1 to 2 glass of wine at least 4 nights a week   Drug use: No   Sexual activity: Not on file  Other Topics Concern   Not on file  Social History Narrative   Married.   Moved here from [place] after 35 years   Enjoys arts and crafts. Taking a painting class.   Walking with her spouse.    Reading, shopping.   Right handed   Social Determinants of Health   Financial Resource Strain: Low Risk   (11/21/2021)   Overall Financial Resource Strain (CARDIA)    Difficulty of Paying Living Expenses: Not hard at all  Food Insecurity: No Food Insecurity (11/21/2021)   Hunger Vital Sign    Worried About Running Out of Food in the Last Year: Never true    Ran Out of Food in the Last Year: Never true  Transportation Needs: No Transportation Needs (11/21/2021)   PRAPARE - Hydrologist (Medical): No    Lack of Transportation (Non-Medical): No  Physical Activity: Sufficiently Active (11/21/2021)   Exercise Vital Sign    Days of Exercise per Week: 5 days    Minutes of  Exercise per Session: 60 min  Stress: No Stress Concern Present (11/21/2021)   [place]    Feeling of Stress : Only a little  Social Connections: Moderately Isolated (11/21/2021)   Social Connection and Isolation Panel [NHANES]    Frequency of Communication with Friends and Family: More than three times a week    Frequency of Social Gatherings with Friends and Family: More than three times a week    Attends Religious Services: Never    Marine scientist or Organizations: No    Attends Music therapist: Never    Marital Status: Married     Family History:  The patient's family history includes Asthma in her father; Breast cancer in her maternal grandmother; Cancer in her maternal grandmother; Heart disease in her mother; Hypertension in her mother; Parkinson's disease in her mother; Parkinsonism in her mother.  ROS:   12-point review of systems is negative unless otherwise noted in the HPI.   EKGs/Labs/Other Studies Reviewed:    Studies reviewed were summarized above. The additional studies were reviewed today:  Zio patch 08/2020: Normal sinus rhythm with an average heart rate 58 bpm.  Lowest heart rate was 39 bpm at 7:18 in the morning. 166 episodes of SVT noted.  The longest lasted 3 minutes and 15 seconds with  an average rate of 115 bpm.  Most of these episodes were associated with symptoms of shortness of breath and palpitations. __________  2D echo 03/18/2019: 1. The left ventricle has normal systolic function with an ejection  fraction of 60-65%. The cavity size was normal. Left ventricular diastolic  Doppler parameters are consistent with pseudonormalization.   2. The right ventricle has normal systolic function. The cavity was  normal. There is no increase in right ventricular wall thickness.Unable to  estimate RVSP.   3. Mitral valve regurgitation is moderate. Unable to exclude mild valve  prolapse.  __________  [facility] 04/25/2018: The left ventricular systolic function is normal. LV end diastolic pressure is mildly elevated. The left ventricular ejection fraction is 55-65% by visual estimate.   1.  Normal coronary arteries.  Sluggish flow in the LAD suggestive of endothelial dysfunction. 2.  Normal LV systolic function.  Mildly elevated left ventricular end-diastolic pressure at 18 mmHg.  No significant mitral regurgitation   Recommendations: No significant obstructive coronary artery disease to explain the patient's symptoms.  She likely has mild degree of diastolic heart failure.  Recommend continuing small dose furosemide. Small dose long-acting nitroglycerin can be considered for suspected endothelial dysfunction.  __________  [facility] 04/24/2018: Rest only images with anterior wall perfusion defect. The  patient was supposed to have a treadmill nuclear stress test but had hypotensive response with exercise and thus was not injected with stress dose.  The test was canceled and cardiac catheterization was recommended. __________  2D echo 04/24/2018: - Left ventricle: The cavity size was normal. Wall thickness was    normal. Systolic function was normal. The estimated ejection    fraction was in the range of 55% to 60%. Wall motion was normal;    there were no regional wall motion  abnormalities.  - Mitral valve: There was moderate regurgitation.  - Pulmonary arteries: Systolic pressure was within the normal    range.    EKG:  EKG is ordered today.  The EKG ordered today demonstrates sinus bradycardia, 44 bpm, right axis deviation, no acute ST-T changes, consistent with prior tracing  Recent Labs:  08/10/2022: ALT 18; BUN 23; Creatinine, Ser 0.79; Hemoglobin 13.0; Magnesium 2.2; Platelets 281; Potassium 4.1; Sodium 140; TSH 1.694  Recent Lipid Panel    Component Value Date/Time   CHOL 232 (H) 08/10/2022 1130   TRIG 84 08/10/2022 1130   HDL 85 08/10/2022 1130   CHOLHDL 2.7 08/10/2022 1130   VLDL 17 08/10/2022 1130   LDLCALC 130 (H) 08/10/2022 1130    PHYSICAL EXAM:    VS:  BP 118/72 (BP Location: Left Arm, Patient Position: Sitting, Cuff Size: Normal)   Pulse (!) 44   Ht '5\' 6"'$  (1.676 m)   Wt 124 lb 6 oz (56.4 kg)   SpO2 96%   BMI 20.07 kg/m   BMI: Body mass index is 20.07 kg/m.  Physical Exam Vitals reviewed.  Constitutional:      Appearance: She is well-developed.  HENT:     Head: Normocephalic and atraumatic.  Eyes:     General:        Right eye: No discharge.        Left eye: No discharge.  Neck:     Vascular: No JVD.  Cardiovascular:     Rate and Rhythm: Regular rhythm. Bradycardia present.     Pulses:          Posterior tibial pulses are 2+ on the right side and 2+ on the left side.     Heart sounds: Normal heart sounds, S1 normal and S2 normal. Heart sounds not distant. No midsystolic click and no opening snap. No murmur heard.    No friction rub.  Pulmonary:     Effort: Pulmonary effort is normal. No respiratory distress.     Breath sounds: Normal breath sounds. No decreased breath sounds, wheezing or rales.  Chest:     Chest wall: No tenderness.  Abdominal:     General: There is no distension.  Musculoskeletal:     Cervical back: Normal range of motion.     Right lower leg: No edema.     Left lower leg: No edema.  Skin:     General: Skin is warm and dry.     Nails: There is no clubbing.  Neurological:     Mental Status: She is alert and oriented to person, place, and time.  Psychiatric:        Speech: Speech normal.        Behavior: Behavior normal.        Thought Content: Thought content normal.        Judgment: Judgment normal.     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  She continues to be active.  No indication for further ischemic testing at this time.  Check CBC, CMP, and lipid panel.  Currently fasting.  Palpitations with paroxysmal SVT: Symptoms are overall stable with prior cardiac monitoring showing the longest episode lasting just 3 minutes.  Given baseline bradycardia, unable to use standing or as needed beta-blocker or calcium channel blocker.  Symptoms are stable and not significant enough to justify antiarrhythmic medication or ablation.  Continue to monitor.  HFpEF: She appears euvolemic and well compensated on low-dose furosemide.  Update BMP.  Multiple sclerosis: Stable.  Follow-up with PCP.  Bradycardia: Longstanding  issue.  Not on rate limiting medications.  Demonstrated appropriate chronotropic competence in the office.  No indication for PPM.  Check TSH, BMP, magnesium.   Disposition: F/u with Dr. Fletcher Anon or an APP in 12 months.   Medication Adjustments/Labs and Tests Ordered: Current medicines are reviewed at length with the patient today.  Concerns regarding medicines are outlined above. Medication changes, Labs and Tests ordered today are summarized above and listed in the Patient Instructions accessible in Encounters.   Signed, Christell Faith, PA-C 08/10/2022 12:40 PM     [address], [place] [id number] [phone]

## 2022-08-10 ENCOUNTER — Ambulatory Visit: Payer: PPO | Attending: Physician Assistant | Admitting: Physician Assistant

## 2022-08-10 ENCOUNTER — Other Ambulatory Visit
Admission: RE | Admit: 2022-08-10 | Discharge: 2022-08-10 | Disposition: A | Discharge status: Home / Self Care | Payer: PPO | Source: Ambulatory Visit | Attending: Physician Assistant | Admitting: Physician Assistant

## 2022-08-10 ENCOUNTER — Encounter: Payer: Self-pay | Admitting: Physician Assistant

## 2022-08-10 VITALS — BP 118/72 | HR 44 | Ht 66.0 in | Wt 124.4 lb

## 2022-08-10 DIAGNOSIS — I2510 Atherosclerotic heart disease of native coronary artery without angina pectoris: Secondary | ICD-10-CM

## 2022-08-10 DIAGNOSIS — I471 Supraventricular tachycardia, unspecified: Secondary | ICD-10-CM | POA: Diagnosis not present

## 2022-08-10 DIAGNOSIS — R002 Palpitations: Secondary | ICD-10-CM | POA: Diagnosis not present

## 2022-08-10 DIAGNOSIS — R001 Bradycardia, unspecified: Secondary | ICD-10-CM | POA: Diagnosis not present

## 2022-08-10 DIAGNOSIS — I5032 Chronic diastolic (congestive) heart failure: Secondary | ICD-10-CM

## 2022-08-10 LAB — TSH: TSH: 1.694 u[IU]/mL (ref 0.350–4.500)

## 2022-08-10 LAB — COMPREHENSIVE METABOLIC PANEL
ALT: 18 U/L (ref 0–44)
AST: 22 U/L (ref 15–41)
Albumin: 4.5 g/dL (ref 3.5–5.0)
Alkaline Phosphatase: 63 U/L (ref 38–126)
Anion gap: 7 (ref 5–15)
BUN: 23 mg/dL (ref 8–23)
CO2: 28 mmol/L (ref 22–32)
Calcium: 10.1 mg/dL (ref 8.9–10.3)
Chloride: 105 mmol/L (ref 98–111)
Creatinine, Ser: 0.79 mg/dL (ref 0.44–1.00)
GFR, Estimated: >60 mL/min (ref >60)
Glucose, Bld: 102 mg/dL — ABNORMAL HIGH (ref 70–99)
Potassium: 4.1 mmol/L (ref 3.5–5.1)
Sodium: 140 mmol/L (ref 135–145)
Total Bilirubin: 0.9 mg/dL (ref 0.3–1.2)
Total Protein: 7.6 g/dL (ref 6.5–8.1)

## 2022-08-10 LAB — LIPID PANEL
Cholesterol: 232 mg/dL — ABNORMAL HIGH (ref 0–200)
HDL: 85 mg/dL (ref >40)
LDL Cholesterol: 130 mg/dL — ABNORMAL HIGH (ref 0–99)
Total CHOL/HDL Ratio: 2.7 RATIO
Triglycerides: 84 mg/dL (ref <150)
VLDL: 17 mg/dL (ref 0–40)

## 2022-08-10 LAB — CBC
HCT: 39.5 % (ref 36.0–46.0)
Hemoglobin: 13 g/dL (ref 12.0–15.0)
MCH: 28.1 pg (ref 26.0–34.0)
MCHC: 32.9 g/dL (ref 30.0–36.0)
MCV: 85.5 fL (ref 80.0–100.0)
Platelets: 281 10*3/uL (ref 150–400)
RBC: 4.62 MIL/uL (ref 3.87–5.11)
RDW: 13.3 % (ref 11.5–15.5)
WBC: 6.6 10*3/uL (ref 4.0–10.5)
nRBC: 0 % (ref 0.0–0.2)

## 2022-08-10 LAB — MAGNESIUM: Magnesium: 2.2 mg/dL (ref 1.7–2.4)

## 2022-08-10 NOTE — Patient Instructions (Addendum)
Medication Instructions:  No changes at this time.   *If you need a refill on your cardiac medications before your next appointment, please call your pharmacy*   Lab Work: TSH, CBC, CMP, Mag, and Lipid today over at the [facility]. Check in at registration.   If you have labs (blood work) drawn today and your tests are completely normal, you will receive your results only by: [place] (if you have MyChart) OR A paper copy in the mail If you have any lab test that is abnormal or we need to change your treatment, we will call you to review the results.   Testing/Procedures: None   Follow-Up: At [facility], you and your health needs are our priority.  As part of our continuing mission to provide you with exceptional heart care, we have created designated Provider Care Teams.  These Care Teams include your primary Cardiologist (physician) and Advanced Practice Providers (APPs -  Physician Assistants and Nurse Practitioners) who all work together to provide you with the care you need, when you need it.   Your next appointment:   1 year(s)  The format for your next appointment:   In Person  Provider:   Kathlyn Sacramento, MD or Christell Faith, PA-C      Important Information About Sugar

## 2022-08-13 ENCOUNTER — Other Ambulatory Visit: Payer: Self-pay | Admitting: Primary Care

## 2022-08-13 DIAGNOSIS — G4700 Insomnia, unspecified: Secondary | ICD-10-CM

## 2022-08-13 MED ORDER — ZOLPIDEM TARTRATE 10 MG PO TABS: 10.0000 mg | ORAL_TABLET | Freq: Every evening | ORAL | 0 refills | Status: DC | PRN

## 2022-08-13 NOTE — Telephone Encounter (Signed)
From: [place] Nurse To: Office of Pleas Koch, NP Sent: 08/13/2022 12:10 PM EST Subject: Medication Renewal Request  Refills have been requested for the following medications:   zolpidem (AMBIEN) 10 MG tablet [Ravin Denardo K Janaisa Birkland]  Preferred pharmacy: [id number]- [place], [place] - [address] Delivery method: PArlyss Gandy

## 2022-09-04 ENCOUNTER — Ambulatory Visit
Admission: RE | Admit: 2022-09-04 | Discharge: 2022-09-04 | Disposition: A | Discharge status: Home / Self Care | Payer: PPO | Source: Ambulatory Visit | Attending: Neurology | Admitting: Neurology

## 2022-09-04 DIAGNOSIS — R9082 White matter disease, unspecified: Secondary | ICD-10-CM | POA: Diagnosis not present

## 2022-09-04 DIAGNOSIS — G35 Multiple sclerosis: Secondary | ICD-10-CM

## 2022-09-04 MED ORDER — GADOPICLENOL 0.5 MMOL/ML IV SOLN
5.0000 mL | Freq: Once | INTRAVENOUS | Status: AC | PRN
  Administered 2022-09-04: 5 mL via INTRAVENOUS

## 2022-09-04 NOTE — Progress Notes (Addendum)
NEUROLOGY FOLLOW UP OFFICE NOTE  Morgan Herrera [id number]  Assessment/Plan:  Multiple sclerosis - stable Left foot pain and numbness - suspect nerve entrapment in ankle/foot.  S1 radiculopathy possible but denies radicular pain   Refer to podiatry for evaluation of left foot. Will have her take gabapentin '100mg'$  two to three times daily.  We can adjust dose as needed. Repeat MRI of brain with and without contrast in one year. Continue D3 2000 IU daily Follow up 1 year (after repeat MRI)   Subjective:  Morgan Herrera is a 71 year old right-handed woman with multiple sclerosis, polymyalgia rheumatica and migraine who follows up for multiple sclerosis.   UPDATE: DMT:  None Current medications:  B12 '500mg'$ , gabapentin '100mg'$  QHS; Lexapro '10mg'$  daily; Ambien; D3 2000 IU daily.      MRI of brain with and without contrast on 09/04/2022 personally reviewed was stable compared to prior MRI from 09/05/2021   Overall, she is doing well off DMT.  Vision: Chronic right sided peripheral vision loss.   Motor:  No concerns Sensory:  She reports numbness and tingling along the lateral aspect of her left foot.  There is associated sharp and burning pain.  Aggravated when bearing weight or pressing on ball of her foot.   She does have back pain and lumbar X-ray in May showed mild degenerative changes particularly at L5-S1.  However, she denies radicular pain down the leg.  She has undergone physical therapy for back pain. Pain:  As above Gait:  Sometimes may feel briefly off balance.   Bowel/Bladder:  No concerns Fatigue:  Sometimes has episodes of lethargy. Cognition:  No concerns Mood:  Some stress.   Tension-type headaches: They are mild to moderate occipital/biptemporal pressure headaches occurring 3 to 4 days and resolves for a couple of days.  Treats with Aleve or Tylenol (only takes one day a week).  At one time, stabbing pain in back of right ear which resolved.    HISTORY: She was  diagnosed with MS at age 31, when she presented with bilateral optic neuritis, right worse than left.  She was treated with IV steroids at that time..  In hindsight, she recalls brief episodes from her past.  In her late-20s, she had a 3 week episode of dizziness and ataxia.  She had brief episodes of limb paresthesias lasting a couple of days.   Work up included MRIs.  MRIs of the brain have shown lesions suspicious for MS.  Prior cervical MRI reportedly normal.   She never had a relapse and therefore never required further IV steroids.  However, she was diagnosed with polymyalgia rheumatica several years ago, after experiencing severe joint pain and stiffness.     She occasionally has fatigue once in a while, sometimes lasting for 2 weeks.   She does not exhibit Lhermitte's sign.  She reports burning and aching on the bottom of her feet when she walks for prolonged period.   She works part-time as a Radiation protection practitioner.  For the past couple of years, she reports word-finding difficulty.  Sometimes it is difficult to concentrate.  It has almost affected her work at times.  She has a BA in [place].  She underwent neuropsychological testing in July 2017, which revealed anxiety and depression but no cognitive impairment.   Prior disease modifying drugs:  Avonex (tired of injections), Tecfidera (nausea, flushing and unable to take aspirin), Aubagio (effective, tolerated)   09/06/2015:  MRI BRAIN W WO: chronic non-enhancing T2/FLAIR hyperintensities in  the periventricular white matter, some demonstrating Dawson's fingers, with mild corpus callosum involvement, which appears stable compared to 2012 and similar to imaging from 2005.   10/09/2016:  MRI BRAIN W WO:  stable. 12/28/2017:  MRI BRAIN W WO:  stable compared to prior imaging from 2016. 04/07/2020 MRI BRAIN W WO:  Stable mild white matter disease when compared with 2019. 04/07/2020 MRI CERVICAL SPINE W WO:  1.  Normal MRI of the cord.  2.  Cervical spine  degeneration with foraminal narrowings as described.  Diffusely patent spinal canal. 09/05/2021 MRI BRAIN W WO:  unchanged distribution of chronic demyelinating lesions.   PAST MEDICAL HISTORY: Past Medical History:  Diagnosis Date   (HFpEF) heart failure with preserved ejection fraction ([place])    a. 03/2018 Echo: EF 55-60%, no rwma. Mod MR. Nl PASP; b. 03/2019 Echo: EF 60-65%, ? DD, mod MR.   Acute CHF (congestive heart failure) ([place]) 04/23/2018   Chest pain    a. 03/2018 Ex MV: rest only imaging showed ant wall perfusion defect. No stress imaging as pt developed hypotension w/ exercise; b. 05/2018 Cath: Nl cors w/ sluggish flow in the LAD suggestive of endothelial dysfxn. EF 55-65%.   Full body hives 09/12/2020   Migraine    Multiple sclerosis (HCC)    Neuromuscular disorder (HCC)    PMR (polymyalgia rheumatica) (HCC)    Pulmonary nodule    a. 03/2018 CTA Chest: 75m small spiculated nodular density @ the L lung apex. Rec f/u in 3 mos.   [place] 12/13[place]    MEDICATIONS: Current Outpatient Medications on File Prior to Visit  Medication Sig Dispense Refill   albuterol (VENTOLIN HFA) 108 (90 Base) MCG/ACT inhaler Inhale 1-2 puffs into the lungs every 6 (six) hours as needed for wheezing or shortness of breath. 1 g 1   calcium carbonate (OS-CAL - DOSED IN MG OF ELEMENTAL CALCIUM) 1250 (500 Ca) MG tablet Take 1 tablet by mouth daily.     cholecalciferol (VITAMIN D) 1000 units tablet Take 2,000 Units by mouth daily.     cyanocobalamin 500 MCG tablet Take 500 mcg by mouth daily.     escitalopram (LEXAPRO) 10 MG tablet TAKE 1 TABLET BY MOUTH DAILY FOR DEPRESSION. 90 tablet 3   furosemide (LASIX) 20 MG tablet Take 1 tablet (20 mg total) by mouth daily. Office visit required for further refills. 90 tablet 0   gabapentin (NEURONTIN) 100 MG capsule TAKE 1 CAPSULE BY MOUTH THREE TIMES A DAY 270 capsule 2   Magnesium 250 MG TABS Take by mouth.     zolpidem (AMBIEN)  10 MG tablet Take 1 tablet (10 mg total) by mouth at bedtime as needed for sleep. (Patient taking differently: Take 5 mg by mouth at bedtime as needed for sleep.) 30 tablet 0   No current facility-administered medications on file prior to visit.    ALLERGIES: No Known Allergies  FAMILY HISTORY: Family History  Problem Relation Age of Onset   Heart disease Mother    Hypertension Mother    Parkinson's disease Mother    Parkinsonism Mother    Cancer Maternal Grandmother        breast   Breast cancer Maternal Grandmother    Asthma Father       Objective:  Blood pressure 115/73, pulse 61, height '5\' 6"'$  (1.676 m), weight 127 lb 9.6 oz (57.9 kg), SpO2 99 %. General: No acute distress.  Patient appears well-groomed.   Head:  Normocephalic/atraumatic Eyes:  Fundi examined but not visualized Neck: supple, no paraspinal tenderness, full range of motion Heart:  Regular rate and rhythm Back:  no paraspinal tenderness Neurological Exam: alert and oriented to person, place, and time.  Speech fluent and not dysarthric, language intact.  Peripheral vision loss in right eye.  Otherwise, CN II-XII intact. Bulk and tone normal, muscle strength 5/5 throughout.  Sensation to pinprick reduced along the last two toes and lateral aspect of left foot.  Vibratory sensation intact..  Deep tendon reflexes 2+ throughout.  Finger to nose testing intact.  Gait normal, Romberg negative.    Metta Clines, DO  CC: Alma Friendly, NP

## 2022-09-07 ENCOUNTER — Ambulatory Visit: Payer: PPO | Admitting: Neurology

## 2022-09-07 ENCOUNTER — Encounter: Payer: Self-pay | Admitting: Neurology

## 2022-09-07 ENCOUNTER — Telehealth: Payer: Self-pay

## 2022-09-07 VITALS — BP 115/73 | HR 61 | Ht 66.0 in | Wt 127.6 lb

## 2022-09-07 DIAGNOSIS — M79672 Pain in left foot: Secondary | ICD-10-CM | POA: Diagnosis not present

## 2022-09-07 DIAGNOSIS — M62838 Other muscle spasm: Secondary | ICD-10-CM

## 2022-09-07 MED ORDER — GABAPENTIN 100 MG PO CAPS: ORAL_CAPSULE | ORAL | 3 refills | Status: DC

## 2022-09-07 NOTE — Telephone Encounter (Signed)
-----   Message from Renae Gloss, LPN sent at 31/12/9700  9:42 AM EST ----- Appointment on 09/09/23 needs to get MRI B done 2 week before.

## 2022-09-07 NOTE — Patient Instructions (Signed)
Gabapentin '100mg'$  two to three times daily Refer to [facility] regarding left foot pain and numbness Repeat MRI of brain with and without contrast in one year Follow up in one year or as needed

## 2022-09-10 ENCOUNTER — Other Ambulatory Visit: Payer: PPO

## 2022-09-17 ENCOUNTER — Ambulatory Visit: Payer: PPO | Admitting: Physician Assistant

## 2022-09-18 ENCOUNTER — Ambulatory Visit: Payer: PPO | Admitting: Neurology

## 2022-09-19 ENCOUNTER — Ambulatory Visit: Payer: PPO | Admitting: Podiatry

## 2022-09-19 ENCOUNTER — Ambulatory Visit (INDEPENDENT_AMBULATORY_CARE_PROVIDER_SITE_OTHER): Payer: PPO

## 2022-09-19 DIAGNOSIS — M7752 Other enthesopathy of left foot: Secondary | ICD-10-CM | POA: Diagnosis not present

## 2022-09-19 DIAGNOSIS — M79672 Pain in left foot: Secondary | ICD-10-CM | POA: Diagnosis not present

## 2022-09-19 DIAGNOSIS — D3610 Benign neoplasm of peripheral nerves and autonomic nervous system, unspecified: Secondary | ICD-10-CM | POA: Diagnosis not present

## 2022-09-19 MED ORDER — TRIAMCINOLONE ACETONIDE 10 MG/ML IJ SUSP
10.0000 mg | Freq: Once | INTRAMUSCULAR | Status: AC
  Administered 2022-09-19: 10 mg

## 2022-09-19 NOTE — Progress Notes (Signed)
Subjective:   Patient ID: Morgan Herrera, female   DOB: 71 y.o.   MRN: [id number]   HPI Patient states she has had an intensification of pain in her left forefoot over the last year.  It seems to occur with a lot of her different shoes and not with as much with her cushioned shoes and states barefoot also hurts.  Patient has stopped her MS drugs over the last year still doing well as far as that goes does not smoke tries to be active   Review of Systems  All other systems reviewed and are negative.       Objective:  Physical Exam Vitals and nursing note reviewed.  Constitutional:      Appearance: She is well-developed.  Pulmonary:     Effort: Pulmonary effort is normal.  Musculoskeletal:        General: Normal range of motion.  Skin:    General: Skin is warm.  Neurological:     Mental Status: She is alert.     Neurovascular status intact muscle strength was found to be adequate range of motion within normal limits.  Patient is noted to have shooting pain third interspace left foot radiating discomfort into the adjacent digits and moderate discomfort fourth MPJ.  Good digital perfusion well-oriented x 3     Assessment:  Probability for neuroma symptomatology left with fluid buildup and also possible capsulitis     Plan:  H&P reviewed condition went ahead today and x-ray of the foot reviewed did sterile prep injected the third interspace with a combination of Xylocaine Marcaine steroid may require further treatment or other treatments depending on the response conservatively.  Reappoint 3 weeks to reevaluate  X-rays indicate that there is no signs of fracture no signs of arthritis associated with condition

## 2022-09-20 ENCOUNTER — Other Ambulatory Visit: Payer: Self-pay | Admitting: Podiatry

## 2022-09-20 DIAGNOSIS — M7752 Other enthesopathy of left foot: Secondary | ICD-10-CM

## 2022-09-20 DIAGNOSIS — M79672 Pain in left foot: Secondary | ICD-10-CM

## 2022-09-20 DIAGNOSIS — D3610 Benign neoplasm of peripheral nerves and autonomic nervous system, unspecified: Secondary | ICD-10-CM

## 2022-10-10 ENCOUNTER — Ambulatory Visit (INDEPENDENT_AMBULATORY_CARE_PROVIDER_SITE_OTHER): Payer: PPO | Admitting: Podiatry

## 2022-10-10 ENCOUNTER — Encounter: Payer: Self-pay | Admitting: Podiatry

## 2022-10-10 DIAGNOSIS — D3610 Benign neoplasm of peripheral nerves and autonomic nervous system, unspecified: Secondary | ICD-10-CM | POA: Diagnosis not present

## 2022-10-10 NOTE — Progress Notes (Signed)
Subjective:   Patient ID: Morgan Herrera, female   DOB: 72 y.o.   MRN: [id number]   HPI Patient presents stating the pain between my toes went away for about 6 to 8 hours and then reoccurred and is quite sore again.  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  Explained procedure risk patient wants surgery and at this point read consent form for surgical neurectomy and signed after review.  Understands total recovery.  Will take several months patient wants surgery and is scheduled for outpatient surgery at this time

## 2022-10-11 ENCOUNTER — Ambulatory Visit: Payer: PPO | Admitting: Primary Care

## 2022-10-11 ENCOUNTER — Telehealth: Payer: Self-pay | Admitting: Podiatry

## 2022-10-11 NOTE — Telephone Encounter (Addendum)
DOS: 10/30/2022  [facility]  Neurectomy 3rd Lt [phone])  Authorization #: [id number] Authorization Valid: 10/30/2022 - 01/28/2023

## 2022-10-28 ENCOUNTER — Other Ambulatory Visit: Payer: Self-pay | Admitting: Primary Care

## 2022-10-28 DIAGNOSIS — G4700 Insomnia, unspecified: Secondary | ICD-10-CM

## 2022-10-28 MED ORDER — ZOLPIDEM TARTRATE 10 MG PO TABS: 10.0000 mg | ORAL_TABLET | Freq: Every evening | ORAL | 0 refills | Status: DC | PRN

## 2022-10-28 NOTE — Telephone Encounter (Signed)
From: [place] Nurse To: Office of Pleas Koch, NP Sent: 10/27/2022 8:23 AM EST Subject: Medication Renewal Request  Refills have been requested for the following medications:   zolpidem (AMBIEN) 10 MG tablet [Erykah Lippert K Siddiq Kaluzny]  Preferred pharmacy: [id number]- [place], [place] - [address] Delivery method: PArlyss Gandy

## 2022-10-29 MED ORDER — HYDROCODONE-ACETAMINOPHEN 10-325 MG PO TABS: 1.0000 | ORAL_TABLET | Freq: Three times a day (TID) | ORAL | 0 refills | Status: AC | PRN

## 2022-10-29 NOTE — Addendum Note (Signed)
Addended by: Wallene Huh on: 10/29/2022 05:12 PM   Modules accepted: Orders

## 2022-10-30 DIAGNOSIS — G5762 Lesion of plantar nerve, left lower limb: Secondary | ICD-10-CM | POA: Diagnosis not present

## 2022-10-30 DIAGNOSIS — M799 Soft tissue disorder, unspecified: Secondary | ICD-10-CM | POA: Diagnosis not present

## 2022-10-30 HISTORY — PX: FOOT SURGERY: SHX648

## 2022-11-01 ENCOUNTER — Telehealth: Payer: Self-pay

## 2022-11-01 NOTE — Telephone Encounter (Signed)
POST OP CALL-    1) General condition stated by the patient: everything is going fine, a little achy no pain  2) Is the pt having pain? No   3) Pain score: 1-2  4) Has the pt taken Rx'd pain medication, regularly or PRN?   Hasn't taken any   5) Is the pain medication giving relief? N/a  6) Any fever, chills, nausea, or vomiting, shortness of breath or tightness in calf?  7) Is the bandage clean, dry and intact? yes  8) Is there excessive tightness, bleeding or drainage coming through the bandage? No  9) Did you understand all of the post op instruction sheet given? yes  10) Any questions or concerns regarding post op care/recovery? No     Confirmed POV appointment with patient

## 2022-11-07 ENCOUNTER — Ambulatory Visit (INDEPENDENT_AMBULATORY_CARE_PROVIDER_SITE_OTHER): Payer: PPO

## 2022-11-07 DIAGNOSIS — D3610 Benign neoplasm of peripheral nerves and autonomic nervous system, unspecified: Secondary | ICD-10-CM

## 2022-11-07 DIAGNOSIS — D3613 Benign neoplasm of peripheral nerves and autonomic nervous system of lower limb, including hip: Secondary | ICD-10-CM | POA: Diagnosis not present

## 2022-11-07 NOTE — Progress Notes (Signed)
Patient presents today for post op visit # 1 , patient of Dr. 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 Denies any falls or injury to the foot. Foot is slightly swollen. No signs of infection. No calf pain or shortness of breath. Bandages dry and intact. Incision is intact.  Advised patient that she can transition slowly back into a supportive athletic shoe ober the next 1 week. Patient advised to wear an ace wrap for compression when wear the surgical shoe and use the anklet compression sleeve when wear a supportive athletic shoe. Patient verbalized understanding. At this time patient can get the surgical site wet.          Dr. Paulla Dolly did take a look at the foot today as well.   Foot redressed today with ace wrap and placed back in the surgical shoe. Reviewed icing and elevation. Patient will follow up with Dr. Paulla Dolly as needed.

## 2022-11-19 ENCOUNTER — Other Ambulatory Visit: Payer: PPO

## 2022-11-22 ENCOUNTER — Ambulatory Visit (INDEPENDENT_AMBULATORY_CARE_PROVIDER_SITE_OTHER): Payer: PPO

## 2022-11-22 VITALS — Ht 66.0 in | Wt 127.0 lb

## 2022-11-22 DIAGNOSIS — Z1231 Encounter for screening mammogram for malignant neoplasm of breast: Secondary | ICD-10-CM | POA: Diagnosis not present

## 2022-11-22 DIAGNOSIS — Z0000 Encounter for general adult medical examination without abnormal findings: Secondary | ICD-10-CM

## 2022-11-22 NOTE — Patient Instructions (Signed)
Morgan Herrera , Thank you for taking time to come for your Medicare Wellness Visit. I appreciate your ongoing commitment to your health goals. Please review the following plan we discussed and let me know if I can assist you in the future.   These are the goals we discussed:  Goals      DIET - EAT MORE FRUITS AND VEGETABLES     Patient Stated     02/23/2020, I will continue to walk 5 miles everyday and do yoga 3 days a week for 1 hour.      Patient Stated     Stay healthy.        This is a list of the screening recommended for you and due dates:  Health Maintenance  Topic Date Due   COVID-19 Vaccine (7 - 2023-24 season) 08/20/2022   Pneumonia Vaccine (3 of 3 - PPSV23 or PCV20) 02/03/2023*   Mammogram  02/23/2023   Medicare Annual Wellness Visit  11/23/2023   DTaP/Tdap/Td vaccine (2 - Tdap) 11/29/2024   Colon Cancer Screening  05/18/2025   Flu Shot  Completed   DEXA scan (bone density measurement)  Completed   Hepatitis C Screening: USPSTF Recommendation to screen - Ages 18-79 yo.  Completed   Zoster (Shingles) Vaccine  Completed   HPV Vaccine  Aged Out  *Topic was postponed. The date shown is not the original due date.    Advanced directives: Please bring a copy of your health care power of attorney and living will to the office to be added to your chart at your convenience.   Conditions/risks identified: none  Next appointment: Follow up in one year for your annual wellness visit 11/28/2023 @ 8:15 via telephone.   Preventive Care 80 Years and Older, Female Preventive care refers to lifestyle choices and visits with your health care provider that can promote health and wellness. What does preventive care include? A yearly physical exam. This is also called an annual well check. Dental exams once or twice a year. Routine eye exams. Ask your health care provider how often you should have your eyes checked. Personal lifestyle choices, including: Daily care of your teeth and  gums. Regular physical activity. Eating a healthy diet. Avoiding tobacco and drug use. Limiting alcohol use. Practicing safe sex. Taking low-dose aspirin every day. Taking vitamin and mineral supplements as recommended by your health care provider. What happens during an annual well check? The services and screenings done by your health care provider during your annual well check will depend on your age, overall health, lifestyle risk factors, and family history of disease. Counseling  Your health care provider may ask you questions about your: Alcohol use. Tobacco use. Drug use. Emotional well-being. Home and relationship well-being. Sexual activity. Eating habits. History of falls. Memory and ability to understand (cognition). Work and work Statistician. Reproductive health. Screening  You may have the following tests or measurements: Height, weight, and BMI. Blood pressure. Lipid and cholesterol levels. These may be checked every 5 years, or more frequently if you are over 69 years old. Skin check. Lung cancer screening. You may have this screening every year starting at age 71 if you have a 30-pack-year history of smoking and currently smoke or have quit within the past 15 years. Fecal occult blood test (FOBT) of the stool. You may have this test every year starting at age 63. Flexible sigmoidoscopy or colonoscopy. You may have a sigmoidoscopy every 5 years or a colonoscopy every 10 years starting at  age 110. Hepatitis C blood test. Hepatitis B blood test. Sexually transmitted disease (STD) testing. Diabetes screening. This is done by checking your blood sugar (glucose) after you have not eaten for a while (fasting). You may have this done every 1-3 years. Bone density scan. This is done to screen for osteoporosis. You may have this done starting at age 102. Mammogram. This may be done every 1-2 years. Talk to your health care provider about how often you should have regular  mammograms. Talk with your health care provider about your test results, treatment options, and if necessary, the need for more tests. Vaccines  Your health care provider may recommend certain vaccines, such as: Influenza vaccine. This is recommended every year. Tetanus, diphtheria, and acellular pertussis (Tdap, Td) vaccine. You may need a Td booster every 10 years. Zoster vaccine. You may need this after age 41. Pneumococcal 13-valent conjugate (PCV13) vaccine. One dose is recommended after age 64. Pneumococcal polysaccharide (PPSV23) vaccine. One dose is recommended after age 23. Talk to your health care provider about which screenings and vaccines you need and how often you need them. This information is not intended to replace advice given to you by your health care provider. Make sure you discuss any questions you have with your health care provider. Document Released: 10/14/2015 Document Revised: 06/06/2016 Document Reviewed: 07/19/2015 Elsevier Interactive Patient Education  2017 [place] Prevention in the Home Falls can cause injuries. They can happen to people of all ages. There are many things you can do to make your home safe and to help prevent falls. What can I do on the outside of my home? Regularly fix the edges of walkways and driveways and fix any cracks. Remove anything that might make you trip as you walk through a door, such as a raised step or threshold. Trim any bushes or trees on the path to your home. Use bright outdoor lighting. Clear any walking paths of anything that might make someone trip, such as rocks or tools. Regularly check to see if handrails are loose or broken. Make sure that both sides of any steps have handrails. Any raised decks and porches should have guardrails on the edges. Have any leaves, snow, or ice cleared regularly. Use sand or salt on walking paths during winter. Clean up any spills in your garage right away. This includes oil  or grease spills. What can I do in the bathroom? Use night lights. Install grab bars by the toilet and in the tub and shower. Do not use towel bars as grab bars. Use non-skid mats or decals in the tub or shower. If you need to sit down in the shower, use a plastic, non-slip stool. Keep the floor dry. Clean up any water that spills on the floor as soon as it happens. Remove soap buildup in the tub or shower regularly. Attach bath mats securely with double-sided non-slip rug tape. Do not have throw rugs and other things on the floor that can make you trip. What can I do in the bedroom? Use night lights. Make sure that you have a light by your bed that is easy to reach. Do not use any sheets or blankets that are too big for your bed. They should not hang down onto the floor. Have a firm chair that has side arms. You can use this for support while you get dressed. Do not have throw rugs and other things on the floor that can make you trip. What can I  do in the kitchen? Clean up any spills right away. Avoid walking on wet floors. Keep items that you use a lot in easy-to-reach places. If you need to reach something above you, use a strong step stool that has a grab bar. Keep electrical cords out of the way. Do not use floor polish or wax that makes floors slippery. If you must use wax, use non-skid floor wax. Do not have throw rugs and other things on the floor that can make you trip. What can I do with my stairs? Do not leave any items on the stairs. Make sure that there are handrails on both sides of the stairs and use them. Fix handrails that are broken or loose. Make sure that handrails are as long as the stairways. Check any carpeting to make sure that it is firmly attached to the stairs. Fix any carpet that is loose or worn. Avoid having throw rugs at the top or bottom of the stairs. If you do have throw rugs, attach them to the floor with carpet tape. Make sure that you have a light  switch at the top of the stairs and the bottom of the stairs. If you do not have them, ask someone to add them for you. What else can I do to help prevent falls? Wear shoes that: Do not have high heels. Have rubber bottoms. Are comfortable and fit you well. Are closed at the toe. Do not wear sandals. If you use a stepladder: Make sure that it is fully opened. Do not climb a closed stepladder. Make sure that both sides of the stepladder are locked into place. Ask someone to hold it for you, if possible. Clearly mark and make sure that you can see: Any grab bars or handrails. First and last steps. Where the edge of each step is. Use tools that help you move around (mobility aids) if they are needed. These include: Canes. Walkers. Scooters. Crutches. Turn on the lights when you go into a dark area. Replace any light bulbs as soon as they burn out. Set up your furniture so you have a clear path. Avoid moving your furniture around. If any of your floors are uneven, fix them. If there are any pets around you, be aware of where they are. Review your medicines with your doctor. Some medicines can make you feel dizzy. This can increase your chance of falling. Ask your doctor what other things that you can do to help prevent falls. This information is not intended to replace advice given to you by your health care provider. Make sure you discuss any questions you have with your health care provider. Document Released: 07/14/2009 Document Revised: 02/23/2016 Document Reviewed: 10/22/2014 Elsevier Interactive Patient Education  2017 [employer].

## 2022-11-22 NOTE — Progress Notes (Signed)
I connected with  Don Ruschak Musto on 11/22/22 by a audio enabled telemedicine application and verified that I am speaking with the correct person using two identifiers.  Patient Location: Home  Provider Location: Home Office  I discussed the limitations of evaluation and management by telemedicine. The patient expressed understanding and agreed to proceed.  Subjective:   Morgan Herrera is a 72 y.o. female who presents for Medicare Annual (Subsequent) preventive examination.  Review of Systems     Cardiac Risk Factors include: advanced age (>72mn, >>86women)     Objective:    Today's Vitals   11/22/22 0902  Weight: 127 lb (57.6 kg)  Height: 5' 6"$  (1.676 m)   Body mass index is 20.5 kg/m.     11/22/2022    9:14 AM 09/07/2022    9:05 AM 03/15/2022   10:06 AM 02/09/2022    3:20 PM 11/21/2021    8:40 AM 08/09/2021   10:29 AM 08/02/2020    1:33 PM  Advanced Directives  Does Patient Have a Medical Advance Directive? Yes Yes Yes Yes Yes Yes Yes  Type of AParamedicof [place]Living will [place] will  Living will;Healthcare Power of [place] will [place] will   Does patient want to make changes to medical advance directive?    No - Patient declined Yes (Inpatient - patient defers changing a medical advance directive and declines information at this time)    Copy of [place] Chart? No - copy requested   No - copy requested No - copy requested      Current Medications (verified) Outpatient Encounter Medications as of 11/22/2022  Medication Sig   calcium carbonate (OS-CAL - DOSED IN MG OF ELEMENTAL CALCIUM) 1250 (500 Ca) MG tablet Take 1 tablet by mouth daily.   cholecalciferol (VITAMIN D) 1000 units tablet Take 2,000 Units by mouth daily.   cyanocobalamin 500 MCG tablet Take 500 mcg by mouth daily.   escitalopram (LEXAPRO) 10 MG tablet TAKE 1  TABLET BY MOUTH DAILY FOR DEPRESSION.   furosemide (LASIX) 20 MG tablet Take 1 tablet (20 mg total) by mouth daily.   gabapentin (NEURONTIN) 100 MG capsule TAKE 1 CAPSULE BY MOUTH THREE TIMES A DAY   Magnesium 250 MG TABS Take by mouth.   Omega-3 Fatty Acids (FISH OIL PEARLS PO) Take 1,400 mg by mouth daily.   Red Yeast Rice Extract (RED YEAST RICE PO) Take 1,200 mg by mouth daily.   Turmeric (QC TUMERIC COMPLEX PO) Take 1,000 mg by mouth daily.   zolpidem (AMBIEN) 10 MG tablet Take 1 tablet (10 mg total) by mouth at bedtime as needed for sleep.   albuterol (VENTOLIN HFA) 108 (90 Base) MCG/ACT inhaler Inhale 1-2 puffs into the lungs every 6 (six) hours as needed for wheezing or shortness of breath. (Patient not taking: Reported on 11/22/2022)   No facility-administered encounter medications on file as of 11/22/2022.    Allergies (verified) Patient has no known allergies.   History: Past Medical History:  Diagnosis Date   (HFpEF) heart failure with preserved ejection fraction ([place]    a. 03/2018 Echo: EF 55-60%, no rwma. Mod MR. Nl PASP; b. 03/2019 Echo: EF 60-65%, ? DD, mod MR.   Acute CHF (congestive heart failure) ([place] 04/23/2018   Chest pain    a. 03/2018 Ex MV: rest only imaging showed ant wall perfusion defect. No stress imaging as pt developed hypotension  w/ exercise; b. 05/2018 Cath: Nl cors w/ sluggish flow in the LAD suggestive of endothelial dysfxn. EF 55-65%.   Full body hives 09/12/2020   Migraine    Multiple sclerosis (HCC)    Neuromuscular disorder (HCC)    PMR (polymyalgia rheumatica) (HCC)    Pulmonary nodule    a. 03/2018 CTA Chest: 67m small spiculated nodular density @ the L lung apex. Rec f/u in 3 mos.   Relapsing remitting multiple sclerosis ([place] 12/13/2015   Past Surgical History:  Procedure Laterality Date   BREAST EXCISIONAL BIOPSY     BREAST SURGERY  1990   COLONOSCOPY WITH PROPOFOL N/A 05/19/2015   Procedure: COLONOSCOPY WITH PROPOFOL;  Surgeon: PHulen Luster MD;   [facility];  Service: Gastroenterology;  Laterality: N/A;   EYE SURGERY     1978 1st of 3 eye surgeries   FOOT SURGERY Left 10/30/2022   LEFT HEART CATH AND CORONARY ANGIOGRAPHY N/A 04/25/2018   Procedure: LEFT HEART CATH AND CORONARY ANGIOGRAPHY;  Surgeon: AWellington Hampshire MD;  [facility];  Service: Cardiovascular;  Laterality: N/A;   TENDON REPAIR     Family History  Problem Relation Age of Onset   Heart disease Mother    Hypertension Mother    Parkinson's disease Mother    Parkinsonism Mother    Cancer Maternal Grandmother        breast   Breast cancer Maternal Grandmother    Asthma Father    Social History   Socioeconomic History   Marital status: Married    Spouse name: Not on file   Number of children: 1   Years of education: Not on file   Highest education level: Bachelor's degree (e.g., BA, AB, BS)  Occupational History   Not on file  Tobacco Use   Smoking status: Former    Packs/day: 1.00    Years: 45.00    Total pack years: 45.00    Types: Cigarettes    Quit date: 2000    Years since quitting: 24.1   Smokeless tobacco: Never  Vaping Use   Vaping Use: Never used  Substance and Sexual Activity   Alcohol use: Yes    Alcohol/week: 0.0 standard drinks of alcohol    Comment: 1 to 2 glass of wine at least 4 nights a week   Drug use: No   Sexual activity: Not on file  Other Topics Concern   Not on file  Social History Narrative   Mar[place].   [place]   Enjoys arts and crafts. Taking a painting class.   Walking with her spouse.    Reading, shopping.   Right handed   Social Determinants of Health   Financial Resource Strain: Low Risk  (11/22/2022)   Overall Financial Resource Strain (CARDIA)    Difficulty of Paying Living Expenses: Not hard at all  Food Insecurity: No Food Insecurity (11/22/2022)   Hunger Vital Sign    Worried About Running Out of Food in the Last Year: Never true    Ran  Out of Food in the Last Year: Never true  Transportation Needs: No Transportation Needs (11/22/2022)   PRAPARE - THydrologist(Medical): No    Lack of Transportation (Non-Medical): No  Physical Activity: Sufficiently Active (11/22/2022)   Exercise Vital Sign    Days of Exercise per Week: 5 days    Minutes of Exercise per Session: 120 min  Stress: No  Stress Concern Present (11/22/2022)   [place]    Feeling of Stress : Not at all  Social Connections: Moderately Isolated (11/22/2022)   Social Connection and Isolation Panel [NHANES]    Frequency of Communication with Friends and Family: More than three times a week    Frequency of Social Gatherings with Friends and Family: More than three times a week    Attends Religious Services: Never    Marine scientist or Organizations: No    Attends Music therapist: Never    Marital Status: Married    Tobacco Counseling Counseling given: Not Answered   Clinical Intake:  Pre-visit preparation completed: Yes  Pain : No/denies pain     Nutritional Risks: None Diabetes: No  How often do you need to have someone help you when you read instructions, pamphlets, or other written materials from your doctor or pharmacy?: 1 - Never  Diabetic? no  Interpreter Needed?: No  Information entered by :: C.Klara Stjames LPN   Activities of Daily Living    11/22/2022    9:15 AM  In your present state of health, do you have any difficulty performing the following activities:  Hearing? 0  Vision? 0  Difficulty concentrating or making decisions? 0  Walking or climbing stairs? 0  Dressing or bathing? 0  Doing errands, shopping? 0  Preparing Food and eating ? N  Using the Toilet? N  In the past six months, have you accidently leaked urine? N  Do you have problems with loss of bowel control? N  Managing your Medications? N  Managing your  Finances? N  Housekeeping or managing your Housekeeping? N    Patient Care Team: Pleas Koch, NP as PCP - General (Nurse Practitioner) Wellington Hampshire, MD as PCP - Cardiology (Cardiology) Wellington Hampshire, MD as Consulting Physician (Cardiology) Pieter Partridge, DO as Consulting Physician (Neurology)  Indicate any recent Medical Services you may have received from other than [facility] providers in the past year (date may be approximate).     Assessment:   This is a routine wellness examination for Loanne.  Hearing/Vision screen Hearing Screening - Comments:: No aid Vision Screening - Comments:: Contacts - [facility]  Dietary issues and exercise activities discussed: Current Exercise Habits: Home exercise routine, Type of exercise: yoga;walking, Time (Minutes): > 60, Frequency (Times/Week): 5, Weekly Exercise (Minutes/Week): 0, Intensity: Moderate, Exercise limited by: None identified   Goals Addressed             This Visit's Progress    Patient Stated       Stay healthy.       Depression Screen    11/22/2022    9:13 AM 01/30/2022    2:09 PM 01/30/2022    2:07 PM 01/30/2022    2:06 PM 11/21/2021    8:37 AM 01/18/2021   12:40 PM 09/12/2020    8:08 AM  PHQ 2/9 Scores  PHQ - 2 Score 0 0 0 0 0 0 0  PHQ- 9 Score      0 0    Fall Risk    11/22/2022    9:15 AM 09/07/2022    9:04 AM 03/15/2022   10:05 AM 11/21/2021    8:41 AM 08/09/2021   10:29 AM  Fall Risk   Falls in the past year? 0 0 0 1 0  Number falls in past yr: 0 0 0 0 0  Injury with Fall? 0  0 0 0 0  Risk for fall due to : No Fall Risks   No Fall Risks   Follow up Falls prevention discussed;Falls evaluation completed Falls evaluation completed  Falls prevention discussed     FALL RISK PREVENTION PERTAINING TO THE HOME:  Any stairs in or around the home? Yes  If so, are there any without handrails? No  Home free of loose throw rugs in walkways, pet beds, electrical cords, etc? Yes  Adequate lighting in  your home to reduce risk of falls? Yes   ASSISTIVE DEVICES UTILIZED TO PREVENT FALLS:  Life alert? No  Use of a cane, walker or w/c? No  Grab bars in the bathroom? Yes  Shower chair or bench in shower? Yes  Elevated toilet seat or a handicapped toilet? No    Cognitive Function:    02/23/2020    3:38 PM 12/13/2015    9:55 AM  MMSE - Mini Mental State Exam  Not completed: Refused   Orientation to time  5  Orientation to Place  4  Registration  3  Attention/ Calculation  5  Recall  3  Language- name 2 objects  2  Language- repeat  1  Language- follow 3 step command  3  Language- read & follow direction  1  Write a sentence  1  Copy design  1  Total score  29        11/22/2022    9:22 AM  6CIT Screen  What Year? 0 points  What month? 0 points  What time? 0 points  Count back from 20 0 points  Months in reverse 0 points  Repeat phrase 0 points  Total Score 0 points    Immunizations Immunization History  Administered Date(s) Administered   Fluad Quad(high Dose 65+) 06/11/2019, 05/21/2022   Influenza, High Dose Seasonal PF 06/13/2020, 06/28/2021   Influenza,inj,Quad PF,6+ Mos 06/26/2018   Influenza-Unspecified 06/15/2020   PFIZER Comirnaty(Gray Top)Covid-19 Tri-Sucrose Vaccine 01/02/2021   PFIZER(Purple Top)SARS-COV-2 Vaccination 10/26/2019, 11/16/2019, 06/27/2020   PPD Test 06/15/2016   Pfizer Covid-19 Vaccine Bivalent Booster 21yr & up 08/15/2021   Pneumococcal Conjugate-13 04/10/2017   Pneumococcal Polysaccharide-23 05/26/2015   Respiratory Syncytial Virus Vaccine,Recomb Aduvanted(Arexvy) 06/25/2022   Td 11/30/2014   Unspecified SARS-COV-2 Vaccination 06/25/2022   Zoster Recombinat (Shingrix) 07/28/2019, 01/25/2020   Zoster, Live 10/01/2010    TDAP status: Up to date  Flu Vaccine status: Up to date  Pneumococcal vaccine status: Due, Education has been provided regarding the importance of this vaccine. Advised may receive this vaccine at local pharmacy or  Health Dept. Aware to provide a copy of the vaccination record if obtained from local pharmacy or Health Dept. Verbalized acceptance and understanding.  Covid-19 vaccine status: Completed vaccines  Qualifies for Shingles Vaccine? No   Zostavax completed No   Shingrix Completed?: Yes  Screening Tests Health Maintenance  Topic Date Due   COVID-19 Vaccine (7 - 2023-24 season) 08/20/2022   Pneumonia Vaccine 72 Years old (3 of 3 - PPSV23 or PCV20) 02/03/2023 (Originally 04/10/2022)   MAMMOGRAM  02/23/2023   Medicare Annual Wellness (AWV)  11/23/2023   DTaP/Tdap/Td (2 - Tdap) 11/29/2024   COLONOSCOPY (Pts 45-474yrInsurance coverage will need to be confirmed)  05/18/2025   INFLUENZA VACCINE  Completed   DEXA SCAN  Completed   Hepatitis C Screening  Completed   Zoster Vaccines- Shingrix  Completed   HPV VACCINES  Aged Out    Health Maintenance  Health Maintenance Due  Topic Date  Due   COVID-19 Vaccine (7 - 2023-24 season) 08/20/2022    Colorectal cancer screening: Type of screening: Colonoscopy. Completed 05/19/2015. Repeat every 10 years  Mammogram status: Completed 02/21/2022. Repeat every year  Bone Density status: Completed 07/14/2021. Results reflect: Bone density results: OSTEOPENIA. Repeat every 2 years.  Lung Cancer Screening: (Low Dose CT Chest recommended if Age 48-80 years, 30 pack-year currently smoking OR have quit w/in 15years.) does not qualify.   Lung Cancer Screening Referral: no  Additional Screening:  Hepatitis C Screening: does not qualify; Completed 03/12/2016  Vision Screening: Recommended annual ophthalmology exams for early detection of glaucoma and other disorders of the eye. Is the patient up to date with their annual eye exam?  Yes  Who is the provider or what is the name of the office in which the patient attends annual eye exams? [facility] in [place] If pt is not established with a provider, would they like to be referred to a provider  to establish care? No .   Dental Screening: Recommended annual dental exams for proper oral hygiene  Community Resource Referral / Chronic Care Management: CRR required this visit?  No   CCM required this visit?  No      Plan:     I have personally reviewed and noted the following in the patient's chart:   Medical and social history Use of alcohol, tobacco or illicit drugs  Current medications and supplements including opioid prescriptions. Patient is not currently taking opioid prescriptions. Functional ability and status Nutritional status Physical activity Advanced directives List of other physicians Hospitalizations, surgeries, and ER visits in previous 12 months Vitals Screenings to include cognitive, depression, and falls Referrals and appointments  In addition, I have reviewed and discussed with patient certain preventive protocols, quality metrics, and best practice recommendations. A written personalized care plan for preventive services as well as general preventive health recommendations were provided to patient.     Lebron Conners, LPN   [id number]   Nurse Notes: Declined BMD pt would like to wait another year. Order placed for mammogram.

## 2022-11-28 ENCOUNTER — Encounter: Payer: Self-pay | Admitting: Podiatry

## 2022-11-30 ENCOUNTER — Ambulatory Visit (INDEPENDENT_AMBULATORY_CARE_PROVIDER_SITE_OTHER): Payer: PPO

## 2022-11-30 DIAGNOSIS — D3610 Benign neoplasm of peripheral nerves and autonomic nervous system, unspecified: Secondary | ICD-10-CM

## 2022-11-30 NOTE — Progress Notes (Signed)
Patient presents today for post op visit, patient of Dr. Paulla Dolly.    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  Incisions look good and no signs of infections. Patient states for some reason these last 2 have not dissolved. The sutures were removed, patient tolerated the procedure well.    Reviewed icing and elevation. Patient will follow up with Dr. Paulla Dolly as needed.

## 2022-12-03 ENCOUNTER — Other Ambulatory Visit: Payer: Self-pay | Admitting: Primary Care

## 2022-12-03 DIAGNOSIS — G4700 Insomnia, unspecified: Secondary | ICD-10-CM

## 2022-12-03 MED ORDER — ZOLPIDEM TARTRATE 10 MG PO TABS: 10.0000 mg | ORAL_TABLET | Freq: Every evening | ORAL | 0 refills | Status: DC | PRN

## 2022-12-03 NOTE — Telephone Encounter (Signed)
Patient is due for CPE/follow up in May, this will be required prior to any further refills.  Please schedule, thank you!

## 2022-12-03 NOTE — Telephone Encounter (Signed)
From: [place] Nurse To: Office of Pleas Koch, NP Sent: 12/02/2022 1:37 PM EST Subject: Medication Renewal Request  Refills have been requested for the following medications:   zolpidem (AMBIEN) 10 MG tablet [Gailyn Crook K Vasiliy Mccarry]  Preferred pharmacy: [id number]- [place], [place] - [address] Delivery method: PArlyss Gandy

## 2023-01-09 DIAGNOSIS — H52223 Regular astigmatism, bilateral: Secondary | ICD-10-CM | POA: Diagnosis not present

## 2023-01-09 DIAGNOSIS — H3540 Unspecified peripheral retinal degeneration: Secondary | ICD-10-CM | POA: Diagnosis not present

## 2023-01-09 DIAGNOSIS — Z961 Presence of intraocular lens: Secondary | ICD-10-CM | POA: Diagnosis not present

## 2023-01-09 DIAGNOSIS — H2512 Age-related nuclear cataract, left eye: Secondary | ICD-10-CM | POA: Diagnosis not present

## 2023-01-09 DIAGNOSIS — H5201 Hypermetropia, right eye: Secondary | ICD-10-CM | POA: Diagnosis not present

## 2023-01-09 DIAGNOSIS — H5212 Myopia, left eye: Secondary | ICD-10-CM | POA: Diagnosis not present

## 2023-01-09 DIAGNOSIS — H59811 Chorioretinal scars after surgery for detachment, right eye: Secondary | ICD-10-CM | POA: Diagnosis not present

## 2023-02-14 ENCOUNTER — Ambulatory Visit (INDEPENDENT_AMBULATORY_CARE_PROVIDER_SITE_OTHER): Payer: PPO | Admitting: Primary Care

## 2023-02-14 ENCOUNTER — Encounter: Payer: Self-pay | Admitting: Primary Care

## 2023-02-14 ENCOUNTER — Ambulatory Visit (INDEPENDENT_AMBULATORY_CARE_PROVIDER_SITE_OTHER)
Admission: RE | Admit: 2023-02-14 | Discharge: 2023-02-14 | Disposition: A | Discharge status: Home / Self Care | Payer: PPO | Source: Ambulatory Visit | Attending: Primary Care | Admitting: Primary Care

## 2023-02-14 VITALS — BP 134/82 | HR 57 | Temp 97.7°F | Ht 66.0 in | Wt 130.0 lb

## 2023-02-14 DIAGNOSIS — Z0001 Encounter for general adult medical examination with abnormal findings: Secondary | ICD-10-CM

## 2023-02-14 DIAGNOSIS — G4733 Obstructive sleep apnea (adult) (pediatric): Secondary | ICD-10-CM | POA: Diagnosis not present

## 2023-02-14 DIAGNOSIS — M25561 Pain in right knee: Secondary | ICD-10-CM

## 2023-02-14 DIAGNOSIS — R5382 Chronic fatigue, unspecified: Secondary | ICD-10-CM

## 2023-02-14 DIAGNOSIS — F3342 Major depressive disorder, recurrent, in full remission: Secondary | ICD-10-CM | POA: Diagnosis not present

## 2023-02-14 DIAGNOSIS — G35 Multiple sclerosis: Secondary | ICD-10-CM

## 2023-02-14 DIAGNOSIS — G8929 Other chronic pain: Secondary | ICD-10-CM | POA: Diagnosis not present

## 2023-02-14 DIAGNOSIS — I5032 Chronic diastolic (congestive) heart failure: Secondary | ICD-10-CM | POA: Diagnosis not present

## 2023-02-14 DIAGNOSIS — R519 Headache, unspecified: Secondary | ICD-10-CM

## 2023-02-14 DIAGNOSIS — E2839 Other primary ovarian failure: Secondary | ICD-10-CM | POA: Diagnosis not present

## 2023-02-14 DIAGNOSIS — G4700 Insomnia, unspecified: Secondary | ICD-10-CM | POA: Diagnosis not present

## 2023-02-14 DIAGNOSIS — E785 Hyperlipidemia, unspecified: Secondary | ICD-10-CM | POA: Diagnosis not present

## 2023-02-14 LAB — LIPID PANEL
Cholesterol: 210 mg/dL — ABNORMAL HIGH (ref 0–200)
HDL: 74.3 mg/dL (ref >39.00)
LDL Cholesterol: 107 mg/dL — ABNORMAL HIGH (ref 0–99)
NonHDL: 135.46
Total CHOL/HDL Ratio: 3
Triglycerides: 143 mg/dL (ref 0.0–149.0)
VLDL: 28.6 mg/dL (ref 0.0–40.0)

## 2023-02-14 LAB — HEMOGLOBIN A1C: Hgb A1c MFr Bld: 5.8 % (ref 4.6–6.5)

## 2023-02-14 NOTE — Assessment & Plan Note (Signed)
Stable.  Discouraged daily napping. Continue sleep apnea treatment.  Continue regular exercise.

## 2023-02-14 NOTE — Assessment & Plan Note (Addendum)
Following with neurology, office notes and MRI brain reviewed from December 2023. Remain off treatment.  Continue gabapentin 100 mg HS.

## 2023-02-14 NOTE — Assessment & Plan Note (Addendum)
Overall controlled.  Continue Ambien 5 mg HS and gabapentin 100 mg HS. Discussed to avoid daily napping.

## 2023-02-14 NOTE — Patient Instructions (Signed)
Complete xray(s) and labs prior to leaving today. I will notify you of your results once received.  Try using diclofenac (Voltaren) Gel to your knee for pain. Continue Aleve as needed. Wear a knee sleeve for support.  Consider an appointment with Dr. Patsy Lager.   Schedule your bone density scan for October or November.  It was a pleasure to see you today!

## 2023-02-14 NOTE — Assessment & Plan Note (Signed)
Appears euvolemic. Following with cardiology, office notes reviewed from November 2023. Continue furosemide 20 mg daily.  CMP pending.

## 2023-02-14 NOTE — Assessment & Plan Note (Signed)
Immunizations UTD. Mammogram scheduled. Bone density scan due in October 2024, orders placed. Colonoscopy UTD, due 2026  Discussed the importance of a healthy diet and regular exercise in order for weight loss, and to reduce the risk of further co-morbidity.  Exam stable. Labs pending.  Follow up in 1 year for repeat physical.

## 2023-02-14 NOTE — Assessment & Plan Note (Signed)
Repeat lipid panel pending. Reviewed lipids from November 2023

## 2023-02-14 NOTE — Assessment & Plan Note (Signed)
Controlled.  Continue Lexapro 10 mg daily. Continue to monitor.  

## 2023-02-14 NOTE — Assessment & Plan Note (Signed)
Controlled.  Managing with a specialized pillow. No concerns today. Fatigue has improved.

## 2023-02-14 NOTE — Assessment & Plan Note (Signed)
Stable.  No concerns today. Continue to monitor.  

## 2023-02-14 NOTE — Assessment & Plan Note (Signed)
Could be arthritis, cannot exclude ligamental problem.  Checking plain films of the right knee today. Discussed use of Voltaren Gel. Continue Aleve PRN.  Discussed use of knee sleeve for support.  Recommended sports medicine evaluation.

## 2023-02-14 NOTE — Progress Notes (Signed)
Subjective:    Patient ID: Morgan Herrera, female    DOB: January 29, 1951, 72 y.o.   MRN: [id number]  Knee Pain  Associated symptoms include numbness.    Morgan Herrera is a very pleasant 72 y.o. female with a history of multiple sclerosis, PMR, chronic lower back pain who presents today to discuss knee pain and for complete physical.  Symptom onset >3 months ago with pain to her right anterior knee, slightly lateral. Historically, her pain was only noticeable with certain yoga poses and stretching so she avoided those positions. This morning she woke up at 3 am to use the bathroom and noticed intense pain to the site. She took some Aleve and fell back asleep. She notices her pain with ambulation, flexion and extension. She denies recent injury/trauma, swelling, color changes. She has noticed some numbness to her right toes.   Immunizations: -Tetanus: Completed in 2016 -Influenza: Completed last season  -Shingles: Completed Shingrix series -Pneumonia: Completed Prevnar 13 in 2018 and pneumovax in 2016  Diet: Fair diet.  Exercise: Several days weekly   Eye exam: Completes annually  Dental exam: Completes semi-annually    Mammogram: May 2023, scheduled for May 2024 Bone Density Scan: October 2022  Colonoscopy: Completed in 2016, due 2026  Wt Readings from Last 3 Encounters:  02/14/23 130 lb (59 kg)  11/22/22 127 lb (57.6 kg)  09/07/22 127 lb 9.6 oz (57.9 kg)       Review of Systems  Constitutional:  Negative for unexpected weight change.  HENT:  Negative for rhinorrhea.   Respiratory:  Negative for cough and shortness of breath.   Cardiovascular:  Negative for chest pain.  Gastrointestinal:  Negative for constipation and diarrhea.  Genitourinary:  Negative for difficulty urinating.  Musculoskeletal:  Positive for arthralgias. Negative for joint swelling.  Skin:  Negative for color change and rash.  Allergic/Immunologic: Negative for environmental allergies.   Neurological:  Positive for numbness. Negative for dizziness and headaches.  Psychiatric/Behavioral:  Negative for sleep disturbance. The patient is not nervous/anxious.          Past Medical History:  Diagnosis Date   (HFpEF) heart failure with preserved ejection fraction (HCC)    a. 03/2018 Echo: EF 55-60%, no rwma. Mod MR. Nl PASP; b. 03/2019 Echo: EF 60-65%, ? DD, mod MR.   Acute CHF (congestive heart failure) (HCC) 04/23/2018   Chest pain    a. 03/2018 Ex MV: rest only imaging showed ant wall perfusion defect. No stress imaging as pt developed hypotension w/ exercise; b. 05/2018 Cath: Nl cors w/ sluggish flow in the LAD suggestive of endothelial dysfxn. EF 55-65%.   Full body hives 09/12/2020   Loud snoring 02/02/2022   Migraine    Multiple sclerosis (HCC)    Neuromuscular disorder (HCC)    PMR (polymyalgia rheumatica) (HCC)    PND (post-nasal drip) 04/16/2022   Pulmonary nodule    a. 03/2018 CTA Chest: 9mm small spiculated nodular density @ the L lung apex. Rec f/u in 3 mos.   Puncture wound 03/04/2020   Relapsing remitting multiple sclerosis (HCC) 12/13/2015   Shortness of breath 04/30/2018    Social History   Socioeconomic History   Marital status: Married    Spouse name: Not on file   Number of children: 1   Years of education: Not on file   Highest education level: Bachelor's degree (e.g., BA, AB, BS)  Occupational History   Not on file  Tobacco Use   Smoking status: Former  Packs/day: 1.00    Years: 45.00    Additional pack years: 0.00    Total pack years: 45.00    Types: Cigarettes    Quit date: 2000    Years since quitting: 24.3   Smokeless tobacco: Never  Vaping Use   Vaping Use: Never used  Substance and Sexual Activity   Alcohol use: Yes    Alcohol/week: 0.0 standard drinks of alcohol    Comment: 1 to 2 glass of wine at least 4 nights a week   Drug use: No   Sexual activity: Not on file  Other Topics Concern   Not on file  Social History  Narrative   Married.   Moved here from [place]. [facility] after 35 years   Enjoys arts and crafts. Taking a painting class.   Walking with her spouse.    Reading, shopping.   Right handed   Social Determinants of Health   Financial Resource Strain: Low Risk  (11/22/2022)   Overall Financial Resource Strain (CARDIA)    Difficulty of Paying Living Expenses: Not hard at all  Food Insecurity: No Food Insecurity (11/22/2022)   Hunger Vital Sign    Worried About Running Out of Food in the Last Year: Never true    Ran Out of Food in the Last Year: Never true  Transportation Needs: No Transportation Needs (11/22/2022)   PRAPARE - Administrator, Civil Service (Medical): No    Lack of Transportation (Non-Medical): No  Physical Activity: Sufficiently Active (11/22/2022)   Exercise Vital Sign    Days of Exercise per Week: 5 days    Minutes of Exercise per Session: 120 min  Stress: No Stress Concern Present (11/22/2022)   [employer] of Occupational Health - Occupational Stress Questionnaire    Feeling of Stress : Not at all  Social Connections: Moderately Isolated (11/22/2022)   Social Connection and Isolation Panel [NHANES]    Frequency of Communication with Friends and Family: More than three times a week    Frequency of Social Gatherings with Friends and Family: More than three times a week    Attends Religious Services: Never    Database administrator or Organizations: No    Attends Banker Meetings: Never    Marital Status: Married  Catering manager Violence: Not At Risk (11/22/2022)   Humiliation, Afraid, Rape, and Kick questionnaire    Fear of Current or Ex-Partner: No    Emotionally Abused: No    Physically Abused: No    Sexually Abused: No    Past Surgical History:  Procedure Laterality Date   BREAST EXCISIONAL BIOPSY     BREAST SURGERY  1990   COLONOSCOPY WITH PROPOFOL N/A 05/19/2015   Procedure: COLONOSCOPY WITH PROPOFOL;  Surgeon: Wallace Cullens, MD;   Location: [facility] ENDOSCOPY;  Service: Gastroenterology;  Laterality: N/A;   EYE SURGERY     1978 1st of 3 eye surgeries   FOOT SURGERY Left 10/30/2022   LEFT HEART CATH AND CORONARY ANGIOGRAPHY N/A 04/25/2018   Procedure: LEFT HEART CATH AND CORONARY ANGIOGRAPHY;  Surgeon: Iran Ouch, MD;  Location: [facility] INVASIVE CV LAB;  Service: Cardiovascular;  Laterality: N/A;   TENDON REPAIR      Family History  Problem Relation Age of Onset   Heart disease Mother    Hypertension Mother    Parkinson's disease Mother    Parkinsonism Mother    Cancer Maternal Grandmother        breast   Breast  cancer Maternal Grandmother    Asthma Father     No Known Allergies  Current Outpatient Medications on File Prior to Visit  Medication Sig Dispense Refill   albuterol (VENTOLIN HFA) 108 (90 Base) MCG/ACT inhaler Inhale 1-2 puffs into the lungs every 6 (six) hours as needed for wheezing or shortness of breath. 1 g 1   calcium carbonate (OS-CAL - DOSED IN MG OF ELEMENTAL CALCIUM) 1250 (500 Ca) MG tablet Take 1 tablet by mouth daily.     cholecalciferol (VITAMIN D) 1000 units tablet Take 2,000 Units by mouth daily.     cyanocobalamin 500 MCG tablet Take 500 mcg by mouth daily.     escitalopram (LEXAPRO) 10 MG tablet TAKE 1 TABLET BY MOUTH DAILY FOR DEPRESSION. 90 tablet 3   furosemide (LASIX) 20 MG tablet Take 1 tablet (20 mg total) by mouth daily. 90 tablet 2   gabapentin (NEURONTIN) 100 MG capsule TAKE 1 CAPSULE BY MOUTH THREE TIMES A DAY 270 capsule 3   Magnesium 250 MG TABS Take by mouth.     Omega-3 Fatty Acids (FISH OIL PEARLS PO) Take 1,400 mg by mouth daily.     Red Yeast Rice Extract (RED YEAST RICE PO) Take 1,200 mg by mouth daily.     zolpidem (AMBIEN) 10 MG tablet Take 1 tablet (10 mg total) by mouth at bedtime as needed for sleep. 90 tablet 0   Turmeric (QC TUMERIC COMPLEX PO) Take 1,000 mg by mouth daily. (Patient not taking: Reported on 02/14/2023)     No current facility-administered  medications on file prior to visit.    BP 134/82   Pulse (!) 57   Temp 97.7 F (36.5 C) (Temporal)   Ht 5\' 6"  (1.676 m)   Wt 130 lb (59 kg)   SpO2 98%   BMI 20.98 kg/m  Objective:   Physical Exam HENT:     Right Ear: Tympanic membrane and ear canal normal.     Left Ear: Tympanic membrane and ear canal normal.     Nose: Nose normal.  Eyes:     Conjunctiva/sclera: Conjunctivae normal.     Pupils: Pupils are equal, round, and reactive to light.  Neck:     Thyroid: No thyromegaly.  Cardiovascular:     Rate and Rhythm: Normal rate and regular rhythm.     Heart sounds: No murmur heard. Pulmonary:     Effort: Pulmonary effort is normal.     Breath sounds: Normal breath sounds. No rales.  Abdominal:     General: Bowel sounds are normal.     Palpations: Abdomen is soft.     Tenderness: There is no abdominal tenderness.  Musculoskeletal:     Cervical back: Neck supple.     Right knee: No bony tenderness. Decreased range of motion. Tenderness present over the LCL.     Comments: Pain with flexion and extension. No laxity. Ambulates with mild limp.  Lymphadenopathy:     Cervical: No cervical adenopathy.  Skin:    General: Skin is warm and dry.     Findings: No rash.  Neurological:     Mental Status: She is alert and oriented to person, place, and time.     Cranial Nerves: No cranial nerve deficit.     Deep Tendon Reflexes: Reflexes are normal and symmetric.  Psychiatric:        Mood and Affect: Mood normal.           Assessment & Plan:  Encounter for annual  general medical examination with abnormal findings in adult Assessment & Plan: Immunizations UTD. Mammogram scheduled. Bone density scan due in October 2024, orders placed. Colonoscopy UTD, due 2026  Discussed the importance of a healthy diet and regular exercise in order for weight loss, and to reduce the risk of further co-morbidity.  Exam stable. Labs pending.  Follow up in 1 year for repeat  physical.    Multiple sclerosis [facility]) Assessment & Plan: Following with neurology, office notes and MRI brain reviewed from December 2023. Remain off treatment.  Continue gabapentin 100 mg HS.    Chronic diastolic heart failure (HCC) Assessment & Plan: Appears euvolemic. Following with cardiology, office notes reviewed from November 2023. Continue furosemide 20 mg daily.  CMP pending.   Frequent headaches Assessment & Plan: Stable.  No concerns today. Continue to monitor.    Moderate obstructive sleep apnea Assessment & Plan: Controlled.  Managing with a specialized pillow. No concerns today. Fatigue has improved.    Chronic fatigue Assessment & Plan: Stable.  Discouraged daily napping. Continue sleep apnea treatment.  Continue regular exercise.    Estrogen deficiency -     DG Bone Density; Future  Recurrent major depressive disorder, in full remission [facility]) Assessment & Plan: Controlled.  Continue Lexapro 10 mg daily.  Continue to monitor.    Insomnia, unspecified type Assessment & Plan: Overall controlled.  Continue Ambien 5 mg HS and gabapentin 100 mg HS. Discussed to avoid daily napping.    Chronic pain of right knee Assessment & Plan: Could be arthritis, cannot exclude ligamental problem.  Checking plain films of the right knee today. Discussed use of Voltaren Gel. Continue Aleve PRN.  Discussed use of knee sleeve for support.  Recommended sports medicine evaluation.  Orders: -     DG Knee 4 Views W/Patella Right  Hyperlipidemia, unspecified hyperlipidemia type Assessment & Plan: Repeat lipid panel pending. Reviewed lipids from November 2023  Orders: -     Lipid panel -     Hemoglobin A1c        Doreene Nest, NP

## 2023-02-28 ENCOUNTER — Ambulatory Visit
Admission: RE | Admit: 2023-02-28 | Discharge: 2023-02-28 | Disposition: A | Discharge status: Home / Self Care | Payer: PPO | Source: Ambulatory Visit | Attending: Primary Care | Admitting: Primary Care

## 2023-02-28 DIAGNOSIS — Z1231 Encounter for screening mammogram for malignant neoplasm of breast: Secondary | ICD-10-CM | POA: Insufficient documentation

## 2023-03-27 ENCOUNTER — Other Ambulatory Visit: Payer: Self-pay | Admitting: Primary Care

## 2023-03-27 DIAGNOSIS — R609 Edema, unspecified: Secondary | ICD-10-CM

## 2023-03-27 DIAGNOSIS — F330 Major depressive disorder, recurrent, mild: Secondary | ICD-10-CM

## 2023-05-10 ENCOUNTER — Other Ambulatory Visit: Payer: Self-pay | Admitting: Primary Care

## 2023-05-10 ENCOUNTER — Ambulatory Visit (INDEPENDENT_AMBULATORY_CARE_PROVIDER_SITE_OTHER): Payer: PPO | Admitting: Primary Care

## 2023-05-10 VITALS — BP 122/74 | HR 53 | Temp 97.7°F | Ht 66.0 in | Wt 127.0 lb

## 2023-05-10 DIAGNOSIS — R519 Headache, unspecified: Secondary | ICD-10-CM | POA: Diagnosis not present

## 2023-05-10 LAB — CBC WITH DIFFERENTIAL/PLATELET
Absolute Monocytes: 782 cells/uL (ref 200–950)
Basophils Absolute: 111 cells/uL (ref 0–200)
Basophils Relative: 1.4 %
Eosinophils Absolute: 213 cells/uL (ref 15–500)
Eosinophils Relative: 2.7 %
HCT: 40.3 % (ref 35.0–45.0)
Hemoglobin: 13.3 g/dL (ref 11.7–15.5)
Lymphs Abs: 1912 cells/uL (ref 850–3900)
MCH: 28.4 pg (ref 27.0–33.0)
MCHC: 33 g/dL (ref 32.0–36.0)
MCV: 86.1 fL (ref 80.0–100.0)
MPV: 11.2 fL (ref 7.5–12.5)
Monocytes Relative: 9.9 %
Neutro Abs: 4882 cells/uL (ref 1500–7800)
Neutrophils Relative %: 61.8 %
Platelets: 268 10*3/uL (ref 140–400)
RBC: 4.68 10*6/uL (ref 3.80–5.10)
RDW: 13.2 % (ref 11.0–15.0)
Total Lymphocyte: 24.2 %
WBC: 7.9 10*3/uL (ref 3.8–10.8)

## 2023-05-10 LAB — C-REACTIVE PROTEIN: CRP: <1 mg/dL (ref 0.5–20.0)

## 2023-05-10 MED ORDER — KETOROLAC TROMETHAMINE 60 MG/2ML IM SOLN
60.0000 mg | Freq: Once | INTRAMUSCULAR | Status: AC
  Administered 2023-05-10: 60 mg via INTRAMUSCULAR

## 2023-05-10 NOTE — Assessment & Plan Note (Signed)
Returned.  Differentials include temporal arteritis versus migraine, especially in the setting of PMR history. Low suspicion for stroke.  Sed rate, CRP and CBC ordered and pending. IM Toradol 60 mg provided today.  Consider baclofen as needed. Consider biopsy if needed. Await lab results.

## 2023-05-10 NOTE — Progress Notes (Signed)
Subjective:    Patient ID: Morgan Herrera, female    DOB: [birth year], 72 y.o.   MRN: [id number]  Headache  Associated symptoms include photophobia.    Morgan Herrera is a very pleasant 72 y.o. female with a history of CHF, sleep apnea, multiple sclerosis, PMR, frequent headaches, migraines, insomnia who presents today to discuss headache.  Symptom onset approximately 2 weeks ago with a intermittent headache located to the right temporal region. Sometimes she will notice her headache to the parietal lobes. Her right temporal region is tender in a certain spot.  She is also noted brain fog and increased fatigue.  She has noticed mild photophobia and mild nausea at times. She has a history of migraines, her current headache does not feel similar to her prior migraines.  She has taken alternating Aleve and Excedrin twice in total which helps temporarily for a few hours, her headache then returns.  She denies any provoking events for her headaches, unilateral weakness, changes in speech, extremity numbness.  She completed MRI brain in December 2023 which was without masses.   Review of Systems  Constitutional:  Positive for fatigue.  Eyes:  Positive for photophobia.  Respiratory:  Negative for shortness of breath.   Cardiovascular:  Negative for chest pain.  Neurological:  Positive for headaches.         Past Medical History:  Diagnosis Date   (HFpEF) heart failure with preserved ejection fraction (HCC)    a. 03/2018 Echo: EF 55-60%, no rwma. Mod MR. Nl PASP; b. 03/2019 Echo: EF 60-65%, ? DD, mod MR.   Acute CHF (congestive heart failure) (HCC) 04/23/2018   Chest pain    a. 03/2018 Ex MV: rest only imaging showed ant wall perfusion defect. No stress imaging as pt developed hypotension w/ exercise; b. 05/2018 Cath: Nl cors w/ sluggish flow in the LAD suggestive of endothelial dysfxn. EF 55-65%.   Full body hives 09/12/2020   Loud snoring 02/02/2022   Migraine    Multiple  sclerosis (HCC)    Neuromuscular disorder (HCC)    PMR (polymyalgia rheumatica) (HCC)    PND (post-nasal drip) 04/16/2022   Pulmonary nodule    a. 03/2018 CTA Chest: 9mm small spiculated nodular density @ the L lung apex. Rec f/u in 3 mos.   Puncture wound 03/04/2020   Relapsing remitting multiple sclerosis (HCC) 12/13/2015   Shortness of breath 04/30/2018    Social History   Socioeconomic History   Marital status: Married    Spouse name: Not on file   Number of children: 1   Years of education: Not on file   Highest education level: Bachelor's degree (e.g., BA, AB, BS)  Occupational History   Not on file  Tobacco Use   Smoking status: Former    Current packs/day: 0.00    Average packs/day: 1 pack/day for 45.0 years (45.0 ttl pk-yrs)    Types: Cigarettes    Start date: 2    Quit date: 2000    Years since quitting: 24.6   Smokeless tobacco: Never  Vaping Use   Vaping status: Never Used  Substance and Sexual Activity   Alcohol use: Yes    Alcohol/week: 0.0 standard drinks of alcohol    Comment: 1 to 2 glass of wine at least 4 nights a week   Drug use: No   Sexual activity: Not on file  Other Topics Concern   Not on file  Social History Narrative   Married.   Moved here  from [place] after 35 years   Enjoys arts and crafts. Taking a painting class.   Walking with her spouse.    Reading, shopping.   Right handed   Social Determinants of Health   Financial Resource Strain: Low Risk  (05/08/2023)   Overall Financial Resource Strain (CARDIA)    Difficulty of Paying Living Expenses: Not hard at all  Food Insecurity: No Food Insecurity (05/08/2023)   Hunger Vital Sign    Worried About Running Out of Food in the Last Year: Never true    Ran Out of Food in the Last Year: Never true  Transportation Needs: No Transportation Needs (05/08/2023)   PRAPARE - Administrator, Civil Service (Medical): No    Lack of Transportation (Non-Medical): No  Physical Activity:  Sufficiently Active (05/08/2023)   Exercise Vital Sign    Days of Exercise per Week: 5 days    Minutes of Exercise per Session: 60 min  Stress: No Stress Concern Present (05/08/2023)   [employer] of Occupational Health - Occupational Stress Questionnaire    Feeling of Stress : Not at all  Social Connections: Moderately Isolated (05/08/2023)   Social Connection and Isolation Panel [NHANES]    Frequency of Communication with Friends and Family: More than three times a week    Frequency of Social Gatherings with Friends and Family: More than three times a week    Attends Religious Services: Never    Database administrator or Organizations: No    Attends Banker Meetings: Never    Marital Status: Married  Catering manager Violence: Not At Risk (11/22/2022)   Humiliation, Afraid, Rape, and Kick questionnaire    Fear of Current or Ex-Partner: No    Emotionally Abused: No    Physically Abused: No    Sexually Abused: No    Past Surgical History:  Procedure Laterality Date   BREAST EXCISIONAL BIOPSY     BREAST SURGERY  1990   COLONOSCOPY WITH PROPOFOL N/A 05/19/2015   Procedure: COLONOSCOPY WITH PROPOFOL;  Surgeon: Wallace Cullens, MD;  Location: [facility] ENDOSCOPY;  Service: Gastroenterology;  Laterality: N/A;   EYE SURGERY     1978 1st of 3 eye surgeries   FOOT SURGERY Left 10/30/2022   LEFT HEART CATH AND CORONARY ANGIOGRAPHY N/A 04/25/2018   Procedure: LEFT HEART CATH AND CORONARY ANGIOGRAPHY;  Surgeon: Iran Ouch, MD;  Location: [facility] INVASIVE CV LAB;  Service: Cardiovascular;  Laterality: N/A;   TENDON REPAIR      Family History  Problem Relation Age of Onset   Heart disease Mother    Hypertension Mother    Parkinson's disease Mother    Parkinsonism Mother    Cancer Maternal Grandmother        breast   Breast cancer Maternal Grandmother    Asthma Father     No Known Allergies  Current Outpatient Medications on File Prior to Visit  Medication Sig Dispense  Refill   albuterol (VENTOLIN HFA) 108 (90 Base) MCG/ACT inhaler Inhale 1-2 puffs into the lungs every 6 (six) hours as needed for wheezing or shortness of breath. 1 g 1   calcium carbonate (OS-CAL - DOSED IN MG OF ELEMENTAL CALCIUM) 1250 (500 Ca) MG tablet Take 1 tablet by mouth daily.     cholecalciferol (VITAMIN D) 1000 units tablet Take 2,000 Units by mouth daily.     cyanocobalamin 500 MCG tablet Take 500 mcg by mouth daily.     escitalopram (LEXAPRO) 10  MG tablet TAKE 1 TABLET BY MOUTH DAILY FOR DEPRESSION 90 tablet 2   furosemide (LASIX) 20 MG tablet TAKE 1 TABLET BY MOUTH EVERY DAY 90 tablet 2   gabapentin (NEURONTIN) 100 MG capsule TAKE 1 CAPSULE BY MOUTH THREE TIMES A DAY 270 capsule 3   Magnesium 250 MG TABS Take by mouth.     Omega-3 Fatty Acids (FISH OIL PEARLS PO) Take 1,400 mg by mouth daily.     Red Yeast Rice Extract (RED YEAST RICE PO) Take 1,200 mg by mouth daily.     Turmeric (QC TUMERIC COMPLEX PO) Take 1,000 mg by mouth daily.     zolpidem (AMBIEN) 10 MG tablet Take 1 tablet (10 mg total) by mouth at bedtime as needed for sleep. 90 tablet 0   No current facility-administered medications on file prior to visit.    BP 122/74 (BP Location: Left Arm, Patient Position: Sitting, Cuff Size: Normal)   Pulse (!) 53   Temp 97.7 F (36.5 C)   Ht 5\' 6"  (1.676 m)   Wt 127 lb (57.6 kg)   SpO2 95%   BMI 20.50 kg/m  Objective:   Physical Exam Eyes:     Extraocular Movements: Extraocular movements intact.  Cardiovascular:     Rate and Rhythm: Normal rate and regular rhythm.  Pulmonary:     Effort: Pulmonary effort is normal.     Breath sounds: Normal breath sounds.  Musculoskeletal:     Cervical back: Neck supple.  Skin:    General: Skin is warm and dry.  Neurological:     Mental Status: She is alert and oriented to person, place, and time.     Cranial Nerves: No cranial nerve deficit.     Coordination: Coordination normal.           Assessment & Plan:  Frequent  headaches Assessment & Plan: Returned.  Differentials include temporal arteritis versus migraine, especially in the setting of PMR history. Low suspicion for stroke.  Sed rate, CRP and CBC ordered and pending. IM Toradol 60 mg provided today.  Consider baclofen as needed. Consider biopsy if needed. Await lab results.  Orders: -     Ketorolac Tromethamine -     C-reactive protein        Doreene Nest, NP

## 2023-05-10 NOTE — Patient Instructions (Signed)
Stop by the lab prior to leaving today. I will notify you of your results once received.   It was a pleasure to see you today!  

## 2023-05-20 ENCOUNTER — Other Ambulatory Visit: Payer: Self-pay | Admitting: Primary Care

## 2023-05-20 DIAGNOSIS — G4700 Insomnia, unspecified: Secondary | ICD-10-CM

## 2023-05-20 MED ORDER — ZOLPIDEM TARTRATE 10 MG PO TABS
10.0000 mg | ORAL_TABLET | Freq: Every evening | ORAL | 0 refills | Status: DC | PRN
Start: 2023-05-20 — End: 2023-11-28

## 2023-07-16 ENCOUNTER — Ambulatory Visit
Admission: RE | Admit: 2023-07-16 | Discharge: 2023-07-16 | Disposition: A | Discharge status: Home / Self Care | Payer: PPO | Source: Ambulatory Visit | Attending: Primary Care | Admitting: Primary Care

## 2023-07-16 DIAGNOSIS — E2839 Other primary ovarian failure: Secondary | ICD-10-CM | POA: Diagnosis not present

## 2023-07-16 DIAGNOSIS — M8589 Other specified disorders of bone density and structure, multiple sites: Secondary | ICD-10-CM | POA: Diagnosis not present

## 2023-07-16 DIAGNOSIS — Z780 Asymptomatic menopausal state: Secondary | ICD-10-CM | POA: Diagnosis not present

## 2023-09-04 ENCOUNTER — Telehealth: Payer: Self-pay | Admitting: Neurology

## 2023-09-04 ENCOUNTER — Other Ambulatory Visit: Payer: Self-pay

## 2023-09-04 DIAGNOSIS — G35 Multiple sclerosis: Secondary | ICD-10-CM

## 2023-09-04 NOTE — Telephone Encounter (Signed)
Pt called in stating she is scheduled for her MRI on 09/30/23. She currently has an appt on 09/09/23 and there is not another available appointment until May. She wants to double check with Dr. Everlena Cooper if he wants her to still come in on 09/09/23 or reschedule in May?

## 2023-09-05 NOTE — Telephone Encounter (Signed)
Pt is doing well so we will cancel her 09/09/23 appointment and reschedule her for May and place her on the wait list ,

## 2023-09-09 ENCOUNTER — Ambulatory Visit: Payer: PPO | Admitting: Neurology

## 2023-09-30 ENCOUNTER — Ambulatory Visit
Admission: RE | Admit: 2023-09-30 | Discharge: 2023-09-30 | Disposition: A | Discharge status: Home / Self Care | Payer: PPO | Source: Ambulatory Visit | Attending: Neurology | Admitting: Neurology

## 2023-09-30 DIAGNOSIS — G35 Multiple sclerosis: Secondary | ICD-10-CM | POA: Diagnosis not present

## 2023-09-30 MED ORDER — GADOPICLENOL 0.5 MMOL/ML IV SOLN
6.0000 mL | Freq: Once | INTRAVENOUS | Status: AC | PRN
  Administered 2023-09-30: 6 mL via INTRAVENOUS

## 2023-10-15 ENCOUNTER — Telehealth: Payer: PPO | Admitting: Nurse Practitioner

## 2023-10-15 DIAGNOSIS — R3989 Other symptoms and signs involving the genitourinary system: Secondary | ICD-10-CM

## 2023-10-15 MED ORDER — CEPHALEXIN 500 MG PO CAPS
500.0000 mg | ORAL_CAPSULE | Freq: Three times a day (TID) | ORAL | 0 refills | Status: AC
Start: 2023-10-15 — End: 2023-10-22

## 2023-10-15 NOTE — Progress Notes (Signed)
 E-Visit for Urinary Problems  We are sorry that you are not feeling well.  Here is how we plan to help!  Based on what you shared with me it looks like you most likely have a simple urinary tract infection.  A UTI (Urinary Tract Infection) is a bacterial infection of the bladder.  Most cases of urinary tract infections are simple to treat but a key part of your care is to encourage you to drink plenty of fluids and watch your symptoms carefully.  I have prescribed Keflex  500 mg three times a day for 7 days.  Your symptoms should gradually improve. Call us  if the burning in your urine worsens, you develop worsening fever, back pain or pelvic pain or if your symptoms do not resolve after completing the antibiotic.  Urinary tract infections can be prevented by drinking plenty of water to keep your body hydrated.  Also be sure when you wipe, wipe from front to back and don't hold it in!  If possible, empty your bladder every 4 hours.  HOME CARE Drink plenty of fluids Compete the full course of the antibiotics even if the symptoms resolve Remember, when you need to go.go. Holding in your urine can increase the likelihood of getting a UTI! GET HELP RIGHT AWAY IF: You cannot urinate You get a high fever Worsening back pain occurs You see blood in your urine You feel sick to your stomach or throw up You feel like you are going to pass out  MAKE SURE YOU  Understand these instructions. Will watch your condition. Will get help right away if you are not doing well or get worse.   Thank you for choosing an e-visit.  Your e-visit answers were reviewed by a board certified advanced clinical practitioner to complete your personal care plan. Depending upon the condition, your plan could have included both over the counter or prescription medications.  Please review your pharmacy choice. Make sure the pharmacy is open so you can pick up prescription now. If there is a problem, you may contact your  provider through [employer] and have the prescription routed to another pharmacy.  Your safety is important to us . If you have drug allergies check your prescription carefully.   For the next 24 hours you can use MyChart to ask questions about today's visit, request a non-urgent call back, or ask for a work or school excuse. You will get an email in the next two days asking about your experience. I hope that your e-visit has been valuable and will speed your recovery.  I spent approximately 5 minutes reviewing the patient's history, current symptoms and coordinating their care today.

## 2023-10-15 NOTE — Progress Notes (Signed)
 NEUROLOGY FOLLOW UP OFFICE NOTE  SHAKINAH BECVAR [id number]  Assessment/Plan:  Multiple sclerosis - stable Cervicogenic/tension-type headache, manageable   Continue D3 2000 IU daily.  Check level Follow up 1 year   Subjective:  Elda Hardmon is a 73 year old right-handed woman with multiple sclerosis, polymyalgia rheumatica and migraine who follows up for multiple sclerosis.MRI of brain from 09/30/2023 personally reviewed.   UPDATE: DMT:  None Current medications:  B12 500mg , gabapentin  100mg  at bedtime (no longer takes it); Lexapro  10mg  daily; Ambien ; D3 2000 IU daily.    09/30/2023 MRI BRAIN W WO:  Redemonstrated T2 hyperintense lesion in the periventricular white matter, compatible with the patient's history of multiple sclerosis. No new lesion is seen. No evidence of active demyelination.   Overall, she is doing well off DMT.  Vision: Chronic right sided peripheral vision loss.   Motor:  No concerns Sensory:  She reports numbness and tingling along the lateral aspect of her left foot.  There is associated sharp and burning pain.  Aggravated when bearing weight or pressing on ball of her foot.   She does have back pain and lumbar X-ray in May showed mild degenerative changes particularly at L5-S1.  However, she denies radicular pain down the leg.  She has undergone physical therapy for back pain.  Still had pain in feet.  Referred to podiatry.  Ultimately diagnosed with neuroma in the foot and improved after surgery.  Still with residual left foot numbness from surgery. Gait:  Sometimes may feel briefly off balance.   Bowel/Bladder:  No concerns Fatigue:  Sometimes has episodes of lethargy. Cognition:  No concerns Mood:  Some stress.   Cervicogenic/tension-type headaches: They are mild to moderate occipital/temporal (right sided or may be bilateral) pressure headaches occurring once a week.  Increased over the summer.  PCP checked sed rate and CRP which were normal.   Headaches have since improved.  Does have neck pain.  Does yoga with neck stretches 3 times a week.   HISTORY: She was diagnosed with MS at age 8, when she presented with bilateral optic neuritis, right worse than left.  She was treated with IV steroids at that time..  In hindsight, she recalls brief episodes from her past.  In her late-20s, she had a 3 week episode of dizziness and ataxia.  She had brief episodes of limb paresthesias lasting a couple of days.   Work up included MRIs.  MRIs of the brain have shown lesions suspicious for MS.  Prior cervical MRI reportedly normal.   She never had a relapse and therefore never required further IV steroids.  However, she was diagnosed with polymyalgia rheumatica several years ago, after experiencing severe joint pain and stiffness.     She occasionally has fatigue once in a while, sometimes lasting for 2 weeks.   She does not exhibit Lhermitte's sign.  She reports burning and aching on the bottom of her feet when she walks for prolonged period.   She works part-time as a Catering manager.  For the past couple of years, she reports word-finding difficulty.  Sometimes it is difficult to concentrate.  It has almost affected her work at times.  She has a BA in [place].  She underwent neuropsychological testing in July 2017, which revealed anxiety and depression but no cognitive impairment.   Prior disease modifying drugs:  Avonex (tired of injections), Tecfidera  (nausea, flushing and unable to take aspirin ), Aubagio  (effective, tolerated)   09/06/2015:  MRI BRAIN W WO: chronic  non-enhancing T2/FLAIR hyperintensities in the periventricular white matter, some demonstrating Dawson's fingers, with mild corpus callosum involvement, which appears stable compared to 2012 and similar to imaging from 2005.   10/09/2016:  MRI BRAIN W WO:  stable. 12/28/2017:  MRI BRAIN W WO:  stable compared to prior imaging from 2016. 04/07/2020 MRI BRAIN W WO:  Stable mild white  matter disease when compared with 2019. 04/07/2020 MRI CERVICAL SPINE W WO:  1.  Normal MRI of the cord.  2.  Cervical spine degeneration with foraminal narrowings as described.  Diffusely patent spinal canal. 09/05/2021 MRI BRAIN W WO:  unchanged distribution of chronic demyelinating lesions.  09/04/2022 MRI BRAIN W WO:  stable compared to prior MRI from 09/05/2021  PAST MEDICAL HISTORY: Past Medical History:  Diagnosis Date   (HFpEF) heart failure with preserved ejection fraction (HCC)    a. 03/2018 Echo: EF 55-60%, no rwma. Mod MR. Nl PASP; b. 03/2019 Echo: EF 60-65%, ? DD, mod MR.   Acute CHF (congestive heart failure) (HCC) 04/23/2018   Chest pain    a. 03/2018 Ex MV: rest only imaging showed ant wall perfusion defect. No stress imaging as pt developed hypotension w/ exercise; b. 05/2018 Cath: Nl cors w/ sluggish flow in the LAD suggestive of endothelial dysfxn. EF 55-65%.   Full body hives 09/12/2020   Loud snoring 02/02/2022   Migraine    Multiple sclerosis (HCC)    Neuromuscular disorder (HCC)    PMR (polymyalgia rheumatica) (HCC)    PND (post-nasal drip) 04/16/2022   Pulmonary nodule    a. 03/2018 CTA Chest: 9mm small spiculated nodular density @ the L lung apex. Rec f/u in 3 mos.   Puncture wound 03/04/2020   Relapsing remitting multiple sclerosis (HCC) 12/13/2015   Shortness of breath 04/30/2018    MEDICATIONS: Current Outpatient Medications on File Prior to Visit  Medication Sig Dispense Refill   albuterol  (VENTOLIN  HFA) 108 (90 Base) MCG/ACT inhaler Inhale 1-2 puffs into the lungs every 6 (six) hours as needed for wheezing or shortness of breath. 1 g 1   calcium carbonate (OS-CAL - DOSED IN MG OF ELEMENTAL CALCIUM) 1250 (500 Ca) MG tablet Take 1 tablet by mouth daily.     cephALEXin  (KEFLEX ) 500 MG capsule Take 1 capsule (500 mg total) by mouth 3 (three) times daily for 7 days. 21 capsule 0   cholecalciferol (VITAMIN D ) 1000 units tablet Take 2,000 Units by mouth daily.      cyanocobalamin  500 MCG tablet Take 500 mcg by mouth daily.     escitalopram  (LEXAPRO ) 10 MG tablet TAKE 1 TABLET BY MOUTH DAILY FOR DEPRESSION 90 tablet 2   furosemide  (LASIX ) 20 MG tablet TAKE 1 TABLET BY MOUTH EVERY DAY 90 tablet 2   gabapentin  (NEURONTIN ) 100 MG capsule TAKE 1 CAPSULE BY MOUTH THREE TIMES A DAY 270 capsule 3   Magnesium 250 MG TABS Take by mouth.     Omega-3 Fatty Acids (FISH OIL PEARLS PO) Take 1,400 mg by mouth daily.     Red Yeast Rice Extract (RED YEAST RICE PO) Take 1,200 mg by mouth daily.     Turmeric (QC TUMERIC COMPLEX PO) Take 1,000 mg by mouth daily.     zolpidem  (AMBIEN ) 10 MG tablet Take 1 tablet (10 mg total) by mouth at bedtime as needed for sleep. 90 tablet 0   No current facility-administered medications on file prior to visit.    ALLERGIES: No Known Allergies  FAMILY HISTORY: Family History  Problem  Relation Age of Onset   Heart disease Mother    Hypertension Mother    Parkinson's disease Mother    Parkinsonism Mother    Cancer Maternal Grandmother        breast   Breast cancer Maternal Grandmother    Asthma Father       Objective:  Blood pressure 126/75, pulse 60, height 5\' 6"  (1.676 m), weight 129 lb 12.8 oz (58.9 kg), SpO2 97%. General: No acute distress.  Patient appears well-groomed.   Head:  Normocephalic/atraumatic Eyes:  Fundi examined but not visualized Neck: supple, no paraspinal tenderness, full range of motion Heart:  Regular rate and rhythm Back:  no paraspinal tenderness Neurological Exam: alert and oriented to person, place, and time.  Speech fluent and not dysarthric, language intact.  Peripheral vision loss in right eye.  Otherwise, CN II-XII intact. Bulk and tone normal, muscle strength 5/5 throughout.  Sensation to pinprick reduced over dorsum and toes of left foot.  Vibratory sensation intact..  Deep tendon reflexes 2+ throughout.  Finger to nose testing intact.  Gait normal, Romberg negative.     Janne Members,  DO  CC: Tretha Fu, NP

## 2023-10-16 ENCOUNTER — Ambulatory Visit: Payer: PPO | Admitting: Neurology

## 2023-10-16 ENCOUNTER — Other Ambulatory Visit: Payer: PPO

## 2023-10-16 ENCOUNTER — Encounter: Payer: Self-pay | Admitting: Neurology

## 2023-10-16 VITALS — BP 126/75 | HR 60 | Ht 66.0 in | Wt 129.8 lb

## 2023-10-16 DIAGNOSIS — E559 Vitamin D deficiency, unspecified: Secondary | ICD-10-CM | POA: Diagnosis not present

## 2023-10-16 DIAGNOSIS — G35 Multiple sclerosis: Secondary | ICD-10-CM

## 2023-10-16 NOTE — Patient Instructions (Addendum)
 Continue D3 2000 international units daily.  Check level today Follow up in one year.  We don't need to repeat imaging next year.

## 2023-10-17 LAB — VITAMIN D 25 HYDROXY (VIT D DEFICIENCY, FRACTURES): Vit D, 25-Hydroxy: 54 ng/mL (ref 30–100)

## 2023-11-28 ENCOUNTER — Other Ambulatory Visit: Payer: Self-pay | Admitting: Primary Care

## 2023-11-28 ENCOUNTER — Ambulatory Visit: Payer: PPO

## 2023-11-28 VITALS — Ht 66.0 in | Wt 127.0 lb

## 2023-11-28 DIAGNOSIS — Z1231 Encounter for screening mammogram for malignant neoplasm of breast: Secondary | ICD-10-CM | POA: Diagnosis not present

## 2023-11-28 DIAGNOSIS — Z0000 Encounter for general adult medical examination without abnormal findings: Secondary | ICD-10-CM | POA: Diagnosis not present

## 2023-11-28 DIAGNOSIS — G4700 Insomnia, unspecified: Secondary | ICD-10-CM

## 2023-11-28 MED ORDER — TRAZODONE HCL 50 MG PO TABS: 50.0000 mg | ORAL_TABLET | Freq: Every day | ORAL | 0 refills | Status: DC

## 2023-11-28 NOTE — Progress Notes (Signed)
 Subjective:   Morgan Herrera is a 73 y.o. who presents for a Medicare Wellness preventive visit.  Visit Complete: Virtual I connected with  Morgan Herrera on 11/28/23 by a audio enabled telemedicine application and verified that I am speaking with the correct person using two identifiers.  Patient Location: Home  Provider Location: Home Office  I discussed the limitations of evaluation and management by telemedicine. The patient expressed understanding and agreed to proceed.  Vital Signs: Because this visit was a virtual/telehealth visit, some criteria may be missing or patient reported. Any vitals not documented were not able to be obtained and vitals that have been documented are patient reported.  VideoDeclined- This patient declined Librarian, academic. Therefore the visit was completed with audio only.  AWV Questionnaire: Yes: Patient Medicare AWV questionnaire was completed by the patient on 11/24/23; I have confirmed that all information answered by patient is correct and no changes since this date.  Cardiac Risk Factors include: advanced age (>47men, >73 women);dyslipidemia    Objective:    Today's Vitals   11/28/23 0814  Weight: 127 lb (57.6 kg)  Height: 5\' 6"  (1.676 m)   Body mass index is 20.5 kg/m.     11/28/2023    8:26 AM 10/16/2023    8:35 AM 11/22/2022    9:14 AM 09/07/2022    9:05 AM 03/15/2022   10:06 AM 02/09/2022    3:20 PM 11/21/2021    8:40 AM  Advanced Directives  Does Patient Have a Medical Advance Directive? Yes Yes Yes Yes Yes Yes Yes  Type of Estate agent of [place];Living will Healthcare Power of [place];Living will Healthcare Power of [place];Living will Healthcare Power of [place];Living will  Living will;Healthcare Power of [employer] Power of [place];Living will  Does patient want to make changes to medical advance directive?      No - Patient declined Yes (Inpatient - patient  defers changing a medical advance directive and declines information at this time)  Copy of Healthcare Power of Attorney in Chart? Yes - validated most recent copy scanned in chart (See row information)  No - copy requested   No - copy requested No - copy requested    Current Medications (verified) Outpatient Encounter Medications as of 11/28/2023  Medication Sig   calcium carbonate (OS-CAL - DOSED IN MG OF ELEMENTAL CALCIUM) 1250 (500 Ca) MG tablet Take 1 tablet by mouth daily.   cholecalciferol (VITAMIN D) 1000 units tablet Take 2,000 Units by mouth daily.   cyanocobalamin 500 MCG tablet Take 500 mcg by mouth daily.   escitalopram (LEXAPRO) 10 MG tablet TAKE 1 TABLET BY MOUTH DAILY FOR DEPRESSION   furosemide (LASIX) 20 MG tablet TAKE 1 TABLET BY MOUTH EVERY DAY   gabapentin (NEURONTIN) 100 MG capsule TAKE 1 CAPSULE BY MOUTH THREE TIMES A DAY   Magnesium 250 MG TABS Take by mouth.   Omega-3 Fatty Acids (FISH OIL PEARLS PO) Take 1,400 mg by mouth daily.   zolpidem (AMBIEN) 10 MG tablet Take 1 tablet (10 mg total) by mouth at bedtime as needed for sleep.   No facility-administered encounter medications on file as of 11/28/2023.    Allergies (verified) Patient has no known allergies.   History: Past Medical History:  Diagnosis Date   (HFpEF) heart failure with preserved ejection fraction (HCC)    a. 03/2018 Echo: EF 55-60%, no rwma. Mod MR. Nl PASP; b. 03/2019 Echo: EF 60-65%, ? DD, mod MR.  Acute CHF (congestive heart failure) (HCC) 04/23/2018   Chest pain    a. 03/2018 Ex MV: rest only imaging showed ant wall perfusion defect. No stress imaging as pt developed hypotension w/ exercise; b. 05/2018 Cath: Nl cors w/ sluggish flow in the LAD suggestive of endothelial dysfxn. EF 55-65%.   Full body hives 09/12/2020   Loud snoring 02/02/2022   Migraine    Multiple sclerosis (HCC)    Neuromuscular disorder (HCC)    PMR (polymyalgia rheumatica) (HCC)    PND (post-nasal drip) 04/16/2022    Pulmonary nodule    a. 03/2018 CTA Chest: 9mm small spiculated nodular density @ the L lung apex. Rec f/u in 3 mos.   Puncture wound 03/04/2020   Relapsing remitting multiple sclerosis (HCC) 12/13/2015   Shortness of breath 04/30/2018   Past Surgical History:  Procedure Laterality Date   BREAST EXCISIONAL BIOPSY     BREAST SURGERY  1990   COLONOSCOPY WITH PROPOFOL N/A 05/19/2015   Procedure: COLONOSCOPY WITH PROPOFOL;  Surgeon: Wallace Cullens, MD;  Location: [facility] ENDOSCOPY;  Service: Gastroenterology;  Laterality: N/A;   EYE SURGERY     1978 1st of 3 eye surgeries   FOOT SURGERY Left 10/30/2022   LEFT HEART CATH AND CORONARY ANGIOGRAPHY N/A 04/25/2018   Procedure: LEFT HEART CATH AND CORONARY ANGIOGRAPHY;  Surgeon: Iran Ouch, MD;  Location: [facility] INVASIVE CV LAB;  Service: Cardiovascular;  Laterality: N/A;   TENDON REPAIR     Family History  Problem Relation Age of Onset   Heart disease Mother    Hypertension Mother    Parkinson's disease Mother    Parkinsonism Mother    Cancer Maternal Grandmother        breast   Breast cancer Maternal Grandmother    Asthma Father    Social History   Socioeconomic History   Marital status: Married    Spouse name: Not on file   Number of children: 1   Years of education: Not on file   Highest education level: Bachelor's degree (e.g., BA, AB, BS)  Occupational History   Not on file  Tobacco Use   Smoking status: Former    Current packs/day: 0.00    Average packs/day: 1 pack/day for 45.0 years (45.0 ttl pk-yrs)    Types: Cigarettes    Start date: 4    Quit date: 2000    Years since quitting: 25.1   Smokeless tobacco: Never  Vaping Use   Vaping status: Never Used  Substance and Sexual Activity   Alcohol use: Yes    Alcohol/week: 0.0 standard drinks of alcohol    Comment: 1 to 2 glass of wine at least 4 nights a week   Drug use: No   Sexual activity: Not on file  Other Topics Concern   Not on file  Social History Narrative    Married.   Moved here from [place]. [facility] after 35 years   Enjoys arts and crafts. Taking a painting class.   Walking with her spouse.    Reading, shopping.   Right handed   Social Drivers of Health   Financial Resource Strain: Low Risk  (11/28/2023)   Overall Financial Resource Strain (CARDIA)    Difficulty of Paying Living Expenses: Not hard at all  Food Insecurity: No Food Insecurity (11/28/2023)   Hunger Vital Sign    Worried About Running Out of Food in the Last Year: Never true    Ran Out of Food in the Last Year: Never  true  Transportation Needs: No Transportation Needs (11/28/2023)   PRAPARE - Administrator, Civil Service (Medical): No    Lack of Transportation (Non-Medical): No  Physical Activity: Sufficiently Active (11/28/2023)   Exercise Vital Sign    Days of Exercise per Week: 5 days    Minutes of Exercise per Session: 60 min  Stress: Stress Concern Present (11/28/2023)   [employer] of Occupational Health - Occupational Stress Questionnaire    Feeling of Stress : To some extent  Social Connections: Moderately Isolated (11/28/2023)   Social Connection and Isolation Panel [NHANES]    Frequency of Communication with Friends and Family: More than three times a week    Frequency of Social Gatherings with Friends and Family: More than three times a week    Attends Religious Services: Never    Database administrator or Organizations: No    Attends Engineer, structural: Never    Marital Status: Married    Tobacco Counseling Counseling given: Not Answered  Clinical Intake:  Pre-visit preparation completed: Yes  Pain : No/denies pain    BMI - recorded: 20.5 Nutritional Status: BMI of 19-24  Normal Nutritional Risks: None Diabetes: No  How often do you need to have someone help you when you read instructions, pamphlets, or other written materials from your doctor or pharmacy?: 1 - Never  Interpreter Needed?: No  Comments: lives with  husband Information entered by :: B.Ellyssa Zagal,LPN   Activities of Daily Living     11/24/2023   10:44 AM  In your present state of health, do you have any difficulty performing the following activities:  Hearing? 0  Vision? 0  Difficulty concentrating or making decisions? 0  Walking or climbing stairs? 0  Dressing or bathing? 0  Doing errands, shopping? 0  Preparing Food and eating ? N  Using the Toilet? N  In the past six months, have you accidently leaked urine? N  Do you have problems with loss of bowel control? N  Managing your Medications? N  Managing your Finances? N  Housekeeping or managing your Housekeeping? N    Patient Care Team: Doreene Nest, NP as PCP - General (Nurse Practitioner) Iran Ouch, MD as PCP - Cardiology (Cardiology) Iran Ouch, MD as Consulting Physician (Cardiology) Drema Dallas, DO as Consulting Physician (Neurology)  Indicate any recent Medical Services you may have received from other than [facility] providers in the past year (date may be approximate).     Assessment:   This is a routine wellness examination for Burnie.  Hearing/Vision screen Hearing Screening - Comments:: Pt says her hearing is good Vision Screening - Comments:: Pt says her vision in not great in rt eye;wears contacts and glasses [employer]   Goals Addressed             This Visit's Progress    DIET - EAT MORE FRUITS AND VEGETABLES   On track    11/28/23-continue     Patient Stated       11/28/2023-, I will continue to walk 5 miles everyday and do yoga 3 days a week for 1 hour.      Patient Stated   On track    11/28/23-Stay healthy.       Depression Screen     11/28/2023    8:23 AM 02/14/2023    9:50 AM 11/22/2022    9:13 AM 01/30/2022    2:09 PM 01/30/2022    2:07 PM  01/30/2022    2:06 PM 11/21/2021    8:37 AM  PHQ 2/9 Scores  PHQ - 2 Score 0 0 0 0 0 0 0  PHQ- 9 Score  0         Fall Risk     11/24/2023   10:44 AM 10/16/2023    8:35  AM 02/14/2023    9:50 AM 11/22/2022    9:15 AM 09/07/2022    9:04 AM  Fall Risk   Falls in the past year? 0 0 0 0 0  Number falls in past yr: 0 0 0 0 0  Injury with Fall? 0 0 0 0 0  Risk for fall due to : No Fall Risks  No Fall Risks No Fall Risks   Follow up Falls prevention discussed;Education provided Falls evaluation completed Falls evaluation completed Falls prevention discussed;Falls evaluation completed Falls evaluation completed    MEDICARE RISK AT HOME:  Medicare Risk at Home Any stairs in or around the home?: (Patient-Rptd) No If so, are there any without handrails?: (Patient-Rptd) No Home free of loose throw rugs in walkways, pet beds, electrical cords, etc?: (Patient-Rptd) No Adequate lighting in your home to reduce risk of falls?: (Patient-Rptd) Yes Life alert?: (Patient-Rptd) No Use of a cane, walker or w/c?: (Patient-Rptd) No Grab bars in the bathroom?: (Patient-Rptd) No Shower chair or bench in shower?: (Patient-Rptd) Yes Elevated toilet seat or a handicapped toilet?: (Patient-Rptd) No  TIMED UP AND GO:  Was the test performed?  No  Cognitive Function: 6CIT completed    02/23/2020    3:38 PM 12/13/2015    9:55 AM  MMSE - Mini Mental State Exam  Not completed: Refused   Orientation to time  5  Orientation to Place  4  Registration  3  Attention/ Calculation  5  Recall  3  Language- name 2 objects  2  Language- repeat  1  Language- follow 3 step command  3  Language- read & follow direction  1  Write a sentence  1  Copy design  1  Total score  29        11/28/2023    8:29 AM 11/22/2022    9:22 AM  6CIT Screen  What Year? 0 points 0 points  What month? 0 points 0 points  What time? 0 points 0 points  Count back from 20 0 points 0 points  Months in reverse 0 points 0 points  Repeat phrase 0 points 0 points  Total Score 0 points 0 points    Immunizations Immunization History  Administered Date(s) Administered   Fluad Quad(high Dose 65+)  06/11/2019, 05/21/2022   Influenza, High Dose Seasonal PF 06/13/2020, 06/28/2021   Influenza,inj,Quad PF,6+ Mos 06/26/2018   Influenza-Unspecified 06/15/2020   PFIZER Comirnaty(Gray Top)Covid-19 Tri-Sucrose Vaccine 01/02/2021   PFIZER(Purple Top)SARS-COV-2 Vaccination 10/26/2019, 11/16/2019, 06/27/2020   PPD Test 06/15/2016   Pfizer Covid-19 Vaccine Bivalent Booster 32yrs & up 08/15/2021   Pneumococcal Conjugate-13 04/10/2017   Pneumococcal Polysaccharide-23 05/26/2015   Respiratory Syncytial Virus Vaccine,Recomb Aduvanted(Arexvy) 06/25/2022   Td 11/30/2014   Unspecified SARS-COV-2 Vaccination 06/25/2022   Zoster Recombinant(Shingrix) 07/28/2019, 01/25/2020   Zoster, Live 10/01/2010    Screening Tests Health Maintenance  Topic Date Due   Pneumonia Vaccine 39+ Years old (3 of 3 - PPSV23 or PCV20) 05/25/2020   COVID-19 Vaccine (7 - 2024-25 season) 06/02/2023   MAMMOGRAM  02/28/2024   Medicare Annual Wellness (AWV)  11/27/2024   DTaP/Tdap/Td (2 - Tdap) 11/29/2024   Colonoscopy  05/18/2025   INFLUENZA VACCINE  Completed   DEXA SCAN  Completed   Hepatitis C Screening  Completed   Zoster Vaccines- Shingrix  Completed   HPV VACCINES  Aged Out    Health Maintenance  Health Maintenance Due  Topic Date Due   Pneumonia Vaccine 1+ Years old (3 of 3 - PPSV23 or PCV20) 05/25/2020   COVID-19 Vaccine (7 - 2024-25 season) 06/02/2023   Health Maintenance Items Addressed: Mammogram ordered  Additional Screening:  Vision Screening: Recommended annual ophthalmology exams for early detection of glaucoma and other disorders of the eye.  Dental Screening: Recommended annual dental exams for proper oral hygiene  Community Resource Referral / Chronic Care Management: CRR required this visit?  No   CCM required this visit?  Appt scheduled with PCP PE     Plan:     I have personally reviewed and noted the following in the patient's chart:   Medical and social history Use of alcohol,  tobacco or illicit drugs  Current medications and supplements including opioid prescriptions. Patient is not currently taking opioid prescriptions. Functional ability and status Nutritional status Physical activity Advanced directives List of other physicians Hospitalizations, surgeries, and ER visits in previous 12 months Vitals Screenings to include cognitive, depression, and falls Referrals and appointments  In addition, I have reviewed and discussed with patient certain preventive protocols, quality metrics, and best practice recommendations. A written personalized care plan for preventive services as well as general preventive health recommendations were provided to patient.    Sue Lush, LPN   1/61/0960   After Visit Summary: (MyChart) Due to this being a telephonic visit, the after visit summary with patients personalized plan was offered to patient via MyChart   Notes: Please refer to Routing Comments.

## 2023-11-28 NOTE — Patient Instructions (Signed)
 Morgan Herrera , Thank you for taking time to come for your Medicare Wellness Visit. I appreciate your ongoing commitment to your health goals. Please review the following plan we discussed and let me know if I can assist you in the future.   Referrals/Orders/Follow-Ups/Clinician Recommendations:   You have an order for:  []   2D Mammogram  [x]   3D Mammogram  []   Bone Density     Please call for appointment:  [facility] [address] [place], [place] [address] [phone]   Make sure to wear two-piece clothing.  No lotions, powders, or deodorants the day of the appointment. Make sure to bring picture ID and insurance card.  Bring list of medications you are currently taking including any supplements.   Schedule your [place] screening mammogram through MyChart!   Log into your MyChart account.  Go to 'Visit' (or 'Appointments' if on mobile App) --> Schedule an Appointment  Under 'Select a Reason for Visit' choose the Mammogram Screening option.  Complete the pre-visit questions and select the time and place that best fits your schedule.    This is a list of the screening recommended for you and due dates:  Health Maintenance  Topic Date Due   Pneumonia Vaccine (3 of 3 - PPSV23 or PCV20) 05/25/2020   COVID-19 Vaccine (7 - 2024-25 season) 06/02/2023   Mammogram  02/28/2024   Medicare Annual Wellness Visit  11/27/2024   DTaP/Tdap/Td vaccine (2 - Tdap) 11/29/2024   Colon Cancer Screening  05/18/2025   Flu Shot  Completed   DEXA scan (bone density measurement)  Completed   Hepatitis C Screening  Completed   Zoster (Shingles) Vaccine  Completed   HPV Vaccine  Aged Out    Advanced directives: (Copy Requested) Please bring a copy of your health care power of attorney and living will to the office to be added to your chart at your convenience.  Next Medicare Annual Wellness Visit scheduled for next year: Yes 11/30/24 @ 8:50am

## 2023-12-13 ENCOUNTER — Other Ambulatory Visit: Payer: Self-pay | Admitting: Primary Care

## 2023-12-13 DIAGNOSIS — R609 Edema, unspecified: Secondary | ICD-10-CM

## 2023-12-13 DIAGNOSIS — F330 Major depressive disorder, recurrent, mild: Secondary | ICD-10-CM

## 2023-12-13 NOTE — Telephone Encounter (Signed)
 Spoke to pt, r/s cpe to 02/26/24

## 2023-12-13 NOTE — Telephone Encounter (Signed)
 Please call patient:  Looks like she is scheduled for CPE on 12/20/23, but her last CPE was in May 2024. Did she change insurance companies? If not, then I recommend we move her CPE to after Feb 14, 2024. Also ,happy to see her on 12/20/23 for an acute issue if she has one.

## 2023-12-19 ENCOUNTER — Ambulatory Visit (INDEPENDENT_AMBULATORY_CARE_PROVIDER_SITE_OTHER)

## 2023-12-19 ENCOUNTER — Ambulatory Visit: Admitting: Podiatry

## 2023-12-19 ENCOUNTER — Encounter: Payer: Self-pay | Admitting: Podiatry

## 2023-12-19 ENCOUNTER — Telehealth: Payer: Self-pay | Admitting: Podiatry

## 2023-12-19 DIAGNOSIS — M7751 Other enthesopathy of right foot: Secondary | ICD-10-CM | POA: Diagnosis not present

## 2023-12-19 DIAGNOSIS — M7750 Other enthesopathy of unspecified foot: Secondary | ICD-10-CM

## 2023-12-19 DIAGNOSIS — M2021 Hallux rigidus, right foot: Secondary | ICD-10-CM

## 2023-12-19 NOTE — Telephone Encounter (Signed)
 PER SOM[facility] S OF [facility] NO PRIOR AUTH IS REQ.  REF: [id number]

## 2023-12-19 NOTE — Telephone Encounter (Signed)
 DOS: 12/24/23  BI-PLANTAR OSTEOTOMY w/FLEXION 1ST MPJ (R)   28296/M20.21  HTA  EFFECTIVE DATE: 03/31/17  CO INSURANCE : 30%

## 2023-12-19 NOTE — Progress Notes (Signed)
 Subjective:   Patient ID: Morgan Herrera, female   DOB: 73 y.o.   MRN: [id number]   HPI Patient states over the last 6 months she is having increasing pain in the big toe joint right and states that she has been trying wider shoes she has tried cushioning she has tried oral medications soaks without relief and it is increasingly hard for her to be active   ROS      Objective:  Physical Exam  Neurovascular status intact good digital perfusion noted significant loss of motion first MPJ right fluid of the joint surface with pain and large dorsal bone spur formation first MPJ     Assessment:  Significant condition with hallux limitus rigidus deformity right probability for inflammatory capsulitis probability for beginning of cartilage damage with large spur formation     Plan:  H&P reviewed discussed at great length reviewed treatment options and patient would like to get this fixed.  I have recommended a biplanar osteotomy removal of dorsal spurring to encourage motion with if we find that the cartilage is severely impacted may require an implant but I am very hopeful we will catch it in time.  Patient wants to have surgery done needs to have this done soon as she has got a vacation later in the summer and is very motivated that she had very good success with foot surgery left foot over a year ago.  Patient read consent form going over all complications alternative treatments and after extensive review signed consent form and I did dispense air fracture walker to properly fitted to her lower leg I want her getting used to it before the surgery to try to reduce the inflammation and hopefully we can get her scheduled next week as she is quite motivated and has the time to get this done now  X-rays indicate large dorsal spur first MPJ narrowing of the joint surface but still has round and joint which gives me hope we can do a joint preservation procedure

## 2023-12-20 ENCOUNTER — Encounter: Payer: PPO | Admitting: Primary Care

## 2023-12-23 MED ORDER — HYDROCODONE-ACETAMINOPHEN 10-325 MG PO TABS: 1.0000 | ORAL_TABLET | Freq: Three times a day (TID) | ORAL | 0 refills | Status: AC | PRN

## 2023-12-23 NOTE — Addendum Note (Signed)
 Addended by: Lenn Sink on: 12/23/2023 09:55 PM   Modules accepted: Orders

## 2023-12-24 DIAGNOSIS — M205X1 Other deformities of toe(s) (acquired), right foot: Secondary | ICD-10-CM | POA: Diagnosis not present

## 2023-12-24 DIAGNOSIS — M2011 Hallux valgus (acquired), right foot: Secondary | ICD-10-CM | POA: Diagnosis not present

## 2023-12-30 ENCOUNTER — Ambulatory Visit (INDEPENDENT_AMBULATORY_CARE_PROVIDER_SITE_OTHER)

## 2023-12-30 ENCOUNTER — Ambulatory Visit (INDEPENDENT_AMBULATORY_CARE_PROVIDER_SITE_OTHER): Admitting: Podiatry

## 2023-12-30 DIAGNOSIS — Z9889 Other specified postprocedural states: Secondary | ICD-10-CM | POA: Diagnosis not present

## 2023-12-30 NOTE — Progress Notes (Signed)
 Subjective:  Patient ID: Morgan Herrera, female    DOB: 11/22/1950,  MRN: [id number]  No chief complaint on file.   DOS: 12/24/23 Procedure:  Right biplanar osteotomy with fixation of first MPJ  73 y.o. female returns for POV#1. Relates doing well and managing pain  Review of Systems: Negative except as noted in the HPI. Denies N/V/F/Ch.  Past Medical History:  Diagnosis Date   (HFpEF) heart failure with preserved ejection fraction (HCC)    a. 03/2018 Echo: EF 55-60%, no rwma. Mod MR. Nl PASP; b. 03/2019 Echo: EF 60-65%, ? DD, mod MR.   Acute CHF (congestive heart failure) (HCC) 04/23/2018   Chest pain    a. 03/2018 Ex MV: rest only imaging showed ant wall perfusion defect. No stress imaging as pt developed hypotension w/ exercise; b. 05/2018 Cath: Nl cors w/ sluggish flow in the LAD suggestive of endothelial dysfxn. EF 55-65%.   Full body hives 09/12/2020   Loud snoring 02/02/2022   Migraine    Multiple sclerosis (HCC)    Neuromuscular disorder (HCC)    PMR (polymyalgia rheumatica) (HCC)    PND (post-nasal drip) 04/16/2022   Pulmonary nodule    a. 03/2018 CTA Chest: 9mm small spiculated nodular density @ the L lung apex. Rec f/u in 3 mos.   Puncture wound 03/04/2020   Relapsing remitting multiple sclerosis (HCC) 12/13/2015   Shortness of breath 04/30/2018    Current Outpatient Medications:    calcium carbonate (OS-CAL - DOSED IN MG OF ELEMENTAL CALCIUM) 1250 (500 Ca) MG tablet, Take 1 tablet by mouth daily., Disp: , Rfl:    cholecalciferol (VITAMIN D) 1000 units tablet, Take 2,000 Units by mouth daily., Disp: , Rfl:    cyanocobalamin 500 MCG tablet, Take 500 mcg by mouth daily., Disp: , Rfl:    escitalopram (LEXAPRO) 10 MG tablet, TAKE 1 TABLET BY MOUTH DAILY FOR DEPRESSION, Disp: 90 tablet, Rfl: 0   furosemide (LASIX) 20 MG tablet, TAKE 1 TABLET BY MOUTH EVERY DAY, Disp: 90 tablet, Rfl: 0   gabapentin (NEURONTIN) 100 MG capsule, TAKE 1 CAPSULE BY MOUTH THREE TIMES A DAY,  Disp: 270 capsule, Rfl: 3   Magnesium 250 MG TABS, Take by mouth., Disp: , Rfl:    Omega-3 Fatty Acids (FISH OIL PEARLS PO), Take 1,400 mg by mouth daily., Disp: , Rfl:    traZODone (DESYREL) 50 MG tablet, Take 1 tablet (50 mg total) by mouth at bedtime. For sleep., Disp: 90 tablet, Rfl: 0  Social History   Tobacco Use  Smoking Status Former   Current packs/day: 0.00   Average packs/day: 1 pack/day for 45.0 years (45.0 ttl pk-yrs)   Types: Cigarettes   Start date: 63   Quit date: 2000   Years since quitting: 25.2  Smokeless Tobacco Never    No Known Allergies Objective:  There were no vitals filed for this visit. There is no height or weight on file to calculate BMI. Constitutional Well developed. Well nourished.  Vascular Foot warm and well perfused. Capillary refill normal to all digits.   Neurologic Normal speech. Oriented to person, place, and time. Epicritic sensation to light touch grossly present bilaterally.  Dermatologic Skin healing well without signs of infection. Skin edges well coapted without signs of infection.  Orthopedic: Tenderness to palpation noted about the surgical site.   Radiographs: Hardware intact and toe well aligned Assessment:   1. Post-operative state    Plan:  Patient was evaluated and treated and all questions answered.  S/p  foot surgery right -Progressing as expected post-operatively. -WB Status: WBAT  in surgical shoe -Sutures: intact. -Medications: n/a -Foot redressed.  Return in 2 weeks with Dr. Charlsie Merles for post-op check.   No follow-ups on file.

## 2024-01-13 ENCOUNTER — Encounter: Payer: Self-pay | Admitting: Podiatry

## 2024-01-13 ENCOUNTER — Ambulatory Visit (INDEPENDENT_AMBULATORY_CARE_PROVIDER_SITE_OTHER): Admitting: Podiatry

## 2024-01-13 ENCOUNTER — Ambulatory Visit (INDEPENDENT_AMBULATORY_CARE_PROVIDER_SITE_OTHER)

## 2024-01-13 DIAGNOSIS — M2021 Hallux rigidus, right foot: Secondary | ICD-10-CM

## 2024-01-13 DIAGNOSIS — Z9889 Other specified postprocedural states: Secondary | ICD-10-CM

## 2024-01-13 NOTE — Progress Notes (Signed)
 Subjective:   Patient ID: Morgan Herrera, female   DOB: 73 y.o.   MRN: [id number]   HPI Patient states doing very well with surgery very pleased so far   ROS      Objective:  Physical Exam  Neuro vascular status intact negative Celine Collard' sign noted wound edges coapted well hallux rectus position range of motion good for this.  Postop no crepitus of the joint     Assessment:  Doing well post osteotomy right first metatarsal with good range of motion and no pain     Plan:  H&P x-ray taken and at this point I have recommended continued conservative care with compression stocking gradual return to soft shoe gear.  Patient will see Dr. Anderton in 3 weeks for final x-ray if she is doing well she will not need to see me again and I encouraged range of motion  X-rays indicate osteotomies healing well fixation in place joint looks congruous

## 2024-01-28 ENCOUNTER — Ambulatory Visit (INDEPENDENT_AMBULATORY_CARE_PROVIDER_SITE_OTHER): Admitting: Family

## 2024-01-28 ENCOUNTER — Encounter: Payer: Self-pay | Admitting: Family

## 2024-01-28 VITALS — BP 122/80 | HR 87 | Temp 98.7°F | Ht 66.0 in | Wt 131.8 lb

## 2024-01-28 DIAGNOSIS — K130 Diseases of lips: Secondary | ICD-10-CM

## 2024-01-28 MED ORDER — AMOXICILLIN-POT CLAVULANATE 875-125 MG PO TABS
1.0000 | ORAL_TABLET | Freq: Two times a day (BID) | ORAL | 0 refills | Status: DC
Start: 2024-01-28 — End: 2024-02-25

## 2024-01-28 NOTE — Progress Notes (Signed)
   Established Patient Office Visit  Subjective:   Patient ID: Morgan Herrera, female    DOB: January 16, 1951  Age: 73 y.o. MRN: [id number]  CC:  Chief Complaint  Patient presents with   Acute Visit    Possible "cyst" on her mouth    HPI: Morgan Herrera is a 73 y.o. female presenting on 01/28/2024 for Acute Visit (Possible "cyst" on her mouth)  She states had a cyst started a few days ago on the corner of her lip and she popped it and it discharged white pus but then since has re occurred. She has been poking it with a sterile needle to drain but then it just keeps popping up again with new discharge.   No fever.  Tongue is a bit itchy, the edges of her tongue at times unsure f this is associated. She does have runny nose as well, with allergies        ROS: Negative unless specifically indicated above in HPI.   Relevant past medical history reviewed and updated as indicated.   Allergies and medications reviewed and updated.   Current Outpatient Medications:    amoxicillin -clavulanate (AUGMENTIN ) 875-125 MG tablet, Take 1 tablet by mouth 2 (two) times daily., Disp: 20 tablet, Rfl: 0   calcium carbonate (OS-CAL - DOSED IN MG OF ELEMENTAL CALCIUM) 1250 (500 Ca) MG tablet, Take 1 tablet by mouth daily., Disp: , Rfl:    cholecalciferol (VITAMIN D ) 1000 units tablet, Take 2,000 Units by mouth daily., Disp: , Rfl:    cyanocobalamin  500 MCG tablet, Take 500 mcg by mouth daily., Disp: , Rfl:    escitalopram  (LEXAPRO ) 10 MG tablet, TAKE 1 TABLET BY MOUTH DAILY FOR DEPRESSION, Disp: 90 tablet, Rfl: 0   furosemide  (LASIX ) 20 MG tablet, TAKE 1 TABLET BY MOUTH EVERY DAY, Disp: 90 tablet, Rfl: 0   gabapentin  (NEURONTIN ) 100 MG capsule, TAKE 1 CAPSULE BY MOUTH THREE TIMES A DAY, Disp: 270 capsule, Rfl: 3   Magnesium 250 MG TABS, Take by mouth., Disp: , Rfl:    Omega-3 Fatty Acids (FISH OIL PEARLS PO), Take 1,400 mg by mouth daily., Disp: , Rfl:    traZODone  (DESYREL ) 50 MG tablet, Take 1  tablet (50 mg total) by mouth at bedtime. For sleep., Disp: 90 tablet, Rfl: 0  No Known Allergies  Objective:   BP 122/80 (BP Location: Left Arm, Patient Position: Sitting)   Pulse 87   Temp 98.7 F (37.1 C) (Temporal)   Ht 5\' 6"  (1.676 m)   Wt 131 lb 12.8 oz (59.8 kg)   SpO2 98%   BMI 21.27 kg/m    Physical Exam Constitutional:      Appearance: Normal appearance.  Skin:    Comments: Small raised erythematic based white pustule on right lower corner of lip as well as a small raised lesion with slight white pustule   Neurological:     Mental Status: She is alert.     Assessment & Plan:  Lip abscess Assessment & Plan: Pustule Treating with augmentin  advised to stop 'popping'  Can apply warm compresses to site.   Could consider as recurrent labwork to r/o chelitis/fungal vs bacterial if necessary.  Orders: -     Amoxicillin -Pot Clavulanate; Take 1 tablet by mouth 2 (two) times daily.  Dispense: 20 tablet; Refill: 0     Follow up plan: Return for f/u PCP if no improvement in symptoms.  Felicita Horns, FNP

## 2024-01-28 NOTE — Assessment & Plan Note (Signed)
 Pustule Treating with augmentin  advised to stop 'popping'  Can apply warm compresses to site.   Could consider as recurrent labwork to r/o chelitis/fungal vs bacterial if necessary.

## 2024-02-05 ENCOUNTER — Ambulatory Visit (INDEPENDENT_AMBULATORY_CARE_PROVIDER_SITE_OTHER)

## 2024-02-05 ENCOUNTER — Ambulatory Visit (INDEPENDENT_AMBULATORY_CARE_PROVIDER_SITE_OTHER): Admitting: Podiatrist

## 2024-02-05 DIAGNOSIS — M2021 Hallux rigidus, right foot: Secondary | ICD-10-CM | POA: Diagnosis not present

## 2024-02-05 DIAGNOSIS — Z9889 Other specified postprocedural states: Secondary | ICD-10-CM

## 2024-02-05 NOTE — Progress Notes (Signed)
 Chief Complaint  Patient presents with   Routine Post Op    RM#1 POV right foot hallux rigidus patient states doing well.     HPI: Patient is 73 y.o. female who presents today for post of follow up right foot.  She relates she is doing well but is still swollen.  Has been trying to wear different shoes and states the swelling makes it difficult to return to her normal shoes.      No Known Allergies  Review of systems is negative except as noted in the HPI.  Denies nausea/ vomiting/ fevers/ chills or night sweats.   Denies difficulty breathing, denies calf pain or tenderness  Physical Exam  Patient is awake, alert, and oriented x 3.  In no acute distress.    Vascular status is intact with palpable pedal pulses DP and PT bilateral and capillary refill time less than 3 seconds bilateral.  No edema or erythema noted.   Neurological exam reveals epicritic and protective sensation grossly intact bilateral.   Dermatological exam reveals skin is supple and dry to bilateral feet.  No open lesions present.    Musculoskeletal exam: improved range of motion first mpj of the right foot.  Swelling is still present but acceptable for this time in the post op time period.    Xrays:  3 views taken. Show excellent bone healing with pin fixation in place.  Swelling noted on xrays and is improving.  Excellent post op bone healing   Assessment:   ICD-10-CM   1. Hallux rigidus of right foot  M20.21 DG Foot Complete Right    CANCELED: DG Foot Complete Right    2. Status post right foot surgery  Z98.890         Plan: Recommended continued compression on the right foot for the next couple weeks. Discussed shoegear and that the swelling is normal at this point in the healing process. Swelling with decrease over the next 3 months.   Recommended and showed how to perform range of motion exercises.  Discussed she does not need a follow up but if she has any questions or concerns to call.

## 2024-02-09 ENCOUNTER — Emergency Department

## 2024-02-09 ENCOUNTER — Encounter: Payer: Self-pay | Admitting: Emergency Medicine

## 2024-02-09 ENCOUNTER — Other Ambulatory Visit: Payer: Self-pay

## 2024-02-09 ENCOUNTER — Emergency Department
Admission: EM | Admit: 2024-02-09 | Discharge: 2024-02-09 | Disposition: A | Discharge status: Home / Self Care | Attending: Emergency Medicine | Admitting: Emergency Medicine

## 2024-02-09 DIAGNOSIS — W01198A Fall on same level from slipping, tripping and stumbling with subsequent striking against other object, initial encounter: Secondary | ICD-10-CM | POA: Insufficient documentation

## 2024-02-09 DIAGNOSIS — R55 Syncope and collapse: Secondary | ICD-10-CM | POA: Diagnosis not present

## 2024-02-09 DIAGNOSIS — S0083XA Contusion of other part of head, initial encounter: Secondary | ICD-10-CM | POA: Insufficient documentation

## 2024-02-09 DIAGNOSIS — Z955 Presence of coronary angioplasty implant and graft: Secondary | ICD-10-CM | POA: Insufficient documentation

## 2024-02-09 DIAGNOSIS — I6523 Occlusion and stenosis of bilateral carotid arteries: Secondary | ICD-10-CM | POA: Diagnosis not present

## 2024-02-09 DIAGNOSIS — S0990XA Unspecified injury of head, initial encounter: Secondary | ICD-10-CM | POA: Diagnosis not present

## 2024-02-09 DIAGNOSIS — S199XXA Unspecified injury of neck, initial encounter: Secondary | ICD-10-CM | POA: Diagnosis not present

## 2024-02-09 DIAGNOSIS — I5030 Unspecified diastolic (congestive) heart failure: Secondary | ICD-10-CM | POA: Diagnosis not present

## 2024-02-09 LAB — COMPREHENSIVE METABOLIC PANEL WITH GFR
ALT: 22 U/L (ref 0–44)
AST: 27 U/L (ref 15–41)
Albumin: 4.1 g/dL (ref 3.5–5.0)
Alkaline Phosphatase: 50 U/L (ref 38–126)
Anion gap: 9 (ref 5–15)
BUN: 25 mg/dL — ABNORMAL HIGH (ref 8–23)
CO2: 27 mmol/L (ref 22–32)
Calcium: 9.2 mg/dL (ref 8.9–10.3)
Chloride: 101 mmol/L (ref 98–111)
Creatinine, Ser: 0.79 mg/dL (ref 0.44–1.00)
GFR, Estimated: >60 mL/min (ref >60)
Glucose, Bld: 128 mg/dL — ABNORMAL HIGH (ref 70–99)
Potassium: 3.5 mmol/L (ref 3.5–5.1)
Sodium: 137 mmol/L (ref 135–145)
Total Bilirubin: 0.5 mg/dL (ref 0.0–1.2)
Total Protein: 7.1 g/dL (ref 6.5–8.1)

## 2024-02-09 LAB — URINALYSIS, ROUTINE W REFLEX MICROSCOPIC
Bilirubin Urine: NEGATIVE
Glucose, UA: NEGATIVE mg/dL
Hgb urine dipstick: NEGATIVE
Ketones, ur: NEGATIVE mg/dL
Leukocytes,Ua: NEGATIVE
Nitrite: NEGATIVE
Protein, ur: NEGATIVE mg/dL
Specific Gravity, Urine: 1.011 (ref 1.005–1.030)
pH: 5 (ref 5.0–8.0)

## 2024-02-09 LAB — TROPONIN I (HIGH SENSITIVITY)
Troponin I (High Sensitivity): 4 ng/L (ref <18)
Troponin I (High Sensitivity): 4 ng/L (ref <18)

## 2024-02-09 LAB — CBC
HCT: 39.9 % (ref 36.0–46.0)
Hemoglobin: 12.8 g/dL (ref 12.0–15.0)
MCH: 27.9 pg (ref 26.0–34.0)
MCHC: 32.1 g/dL (ref 30.0–36.0)
MCV: 87.1 fL (ref 80.0–100.0)
Platelets: 238 10*3/uL (ref 150–400)
RBC: 4.58 MIL/uL (ref 3.87–5.11)
RDW: 13.4 % (ref 11.5–15.5)
WBC: 6 10*3/uL (ref 4.0–10.5)
nRBC: 0 % (ref 0.0–0.2)

## 2024-02-09 MED ORDER — MECLIZINE HCL 25 MG PO TABS
25.0000 mg | ORAL_TABLET | Freq: Once | ORAL | Status: AC
  Administered 2024-02-09: 25 mg via ORAL
  Filled 2024-02-09: qty 1

## 2024-02-09 MED ORDER — ONDANSETRON HCL 4 MG/2ML IJ SOLN
4.0000 mg | Freq: Once | INTRAMUSCULAR | Status: AC
  Administered 2024-02-09: 4 mg via INTRAVENOUS
  Filled 2024-02-09: qty 2

## 2024-02-09 MED ORDER — ACETAMINOPHEN 500 MG PO TABS
1000.0000 mg | ORAL_TABLET | Freq: Once | ORAL | Status: AC
  Administered 2024-02-09: 1000 mg via ORAL
  Filled 2024-02-09: qty 2

## 2024-02-09 MED ORDER — ONDANSETRON 4 MG PO TBDP: 4.0000 mg | ORAL_TABLET | Freq: Four times a day (QID) | ORAL | 0 refills | Status: DC | PRN

## 2024-02-09 MED ORDER — SODIUM CHLORIDE 0.9 % IV BOLUS (SEPSIS)
1000.0000 mL | Freq: Once | INTRAVENOUS | Status: AC
  Administered 2024-02-09: 1000 mL via INTRAVENOUS

## 2024-02-09 MED ORDER — MECLIZINE HCL 25 MG PO TABS: 25.0000 mg | ORAL_TABLET | Freq: Three times a day (TID) | ORAL | 0 refills | Status: DC | PRN

## 2024-02-09 NOTE — ED Triage Notes (Addendum)
 Pt in after syncopal fall with head strike vs tile floor, no thinners. Pt states she had gotten up to walk to the bathroom, suddenly felt dizzy and passed out. Has hematoma to R forehead, feels nauseous. Denies any other pain or injuries. Does endorse having one glass of wine tonight with dinner, typical for her

## 2024-02-09 NOTE — ED Notes (Signed)
 Pt was able to ambulate around the room with no assistance. The patient stated that she only feels dizzy at certain times when walking

## 2024-02-09 NOTE — ED Provider Notes (Signed)
 [facility] Provider Note    Event Date/Time   First MD Initiated Contact with Patient 02/09/24 0214     (approximate)   History   Loss of Consciousness and Head Injury   HPI  Morgan Herrera is a 73 y.o. female with history of multiple sclerosis (off all medications), polymyalgia rheumatica, heart failure with preserved ejection fraction who presents to the emergency department after a syncopal event.  She states she was in her normal state of health last night.  She went to bed and got up in the middle of the night to go to the bathroom.  She states when she stood up she got lightheaded, passed out and hit her head.  She states after hitting her head she began having headache, vertigo, nausea.  No preceding chest pain, shortness of breath, palpitations.  No vomiting, diarrhea.  Denies any numbness, tingling, weakness, speech or vision changes.  She is not on any blood thinners.  Patient is bradycardic here.  She states her heart rate normally runs in the 40s.   History provided by patient, husband.    Past Medical History:  Diagnosis Date   (HFpEF) heart failure with preserved ejection fraction (HCC)    a. 03/2018 Echo: EF 55-60%, no rwma. Mod MR. Nl PASP; b. 03/2019 Echo: EF 60-65%, ? DD, mod MR.   Acute CHF (congestive heart failure) (HCC) 04/23/2018   Chest pain    a. 03/2018 Ex MV: rest only imaging showed ant wall perfusion defect. No stress imaging as pt developed hypotension w/ exercise; b. 05/2018 Cath: Nl cors w/ sluggish flow in the LAD suggestive of endothelial dysfxn. EF 55-65%.   Full body hives 09/12/2020   Loud snoring 02/02/2022   Migraine    Multiple sclerosis (HCC)    Neuromuscular disorder (HCC)    PMR (polymyalgia rheumatica) (HCC)    PND (post-nasal drip) 04/16/2022   Pulmonary nodule    a. 03/2018 CTA Chest: 9mm small spiculated nodular density @ the L lung apex. Rec f/u in 3 mos.   Puncture wound 03/04/2020   Relapsing remitting  multiple sclerosis (HCC) 12/13/2015   Shortness of breath 04/30/2018    Past Surgical History:  Procedure Laterality Date   BREAST EXCISIONAL BIOPSY     BREAST SURGERY  1990   COLONOSCOPY WITH PROPOFOL  N/A 05/19/2015   Procedure: COLONOSCOPY WITH PROPOFOL ;  Surgeon: Stephens Eis, MD;  Location: [facility] ENDOSCOPY;  Service: Gastroenterology;  Laterality: N/A;   EYE SURGERY     1978 1st of 3 eye surgeries   FOOT SURGERY Left 10/30/2022   LEFT HEART CATH AND CORONARY ANGIOGRAPHY N/A 04/25/2018   Procedure: LEFT HEART CATH AND CORONARY ANGIOGRAPHY;  Surgeon: Wenona Hamilton, MD;  Location: [facility] INVASIVE CV LAB;  Service: Cardiovascular;  Laterality: N/A;   TENDON REPAIR      MEDICATIONS:  Prior to Admission medications   Medication Sig Start Date End Date Taking? Authorizing Provider  amoxicillin -clavulanate (AUGMENTIN ) 875-125 MG tablet Take 1 tablet by mouth 2 (two) times daily. 01/28/24   Dugal, Tabitha, FNP  calcium carbonate (OS-CAL - DOSED IN MG OF ELEMENTAL CALCIUM) 1250 (500 Ca) MG tablet Take 1 tablet by mouth daily.    [provider]  cholecalciferol (VITAMIN D ) 1000 units tablet Take 2,000 Units by mouth daily.    [provider]  cyanocobalamin  500 MCG tablet Take 500 mcg by mouth daily.    [provider]  escitalopram  (LEXAPRO ) 10 MG tablet TAKE  1 TABLET BY MOUTH DAILY FOR DEPRESSION 12/13/23   Clark, Katherine K, NP  furosemide  (LASIX ) 20 MG tablet TAKE 1 TABLET BY MOUTH EVERY DAY 12/13/23   Clark, Katherine K, NP  gabapentin  (NEURONTIN ) 100 MG capsule TAKE 1 CAPSULE BY MOUTH THREE TIMES A DAY 09/07/22   Festus Hubert, Adam R, DO  Magnesium 250 MG TABS Take by mouth.    [provider]  Omega-3 Fatty Acids (FISH OIL PEARLS PO) Take 1,400 mg by mouth daily.    [provider]  traZODone  (DESYREL ) 50 MG tablet Take 1 tablet (50 mg total) by mouth at bedtime. For sleep. 11/28/23   Gabriel John, NP    Physical Exam   Triage Vital Signs: ED  Triage Vitals  Encounter Vitals Group     BP 02/09/24 0016 (!) 147/54     Systolic BP Percentile --      Diastolic BP Percentile --      Pulse Rate 02/09/24 0016 (!) 54     Resp 02/09/24 0016 18     Temp 02/09/24 0016 97.9 F (36.6 C)     Temp Source 02/09/24 0016 Oral     SpO2 02/09/24 0016 98 %     Weight 02/09/24 0018 131 lb 12.8 oz (59.8 kg)     Height --      Head Circumference --      Peak Flow --      Pain Score 02/09/24 0018 4     Pain Loc --      Pain Education --      Exclude from Growth Chart --     Most recent vital signs: Vitals:   02/09/24 0400 02/09/24 0500  BP: 111/79 107/68  Pulse: 68 (!) 43  Resp: 12 18  Temp:    SpO2: 99% 95%     CONSTITUTIONAL: Alert, responds appropriately to questions. Well-appearing; well-nourished; GCS 15 HEAD: Normocephalic; contusion/ecchymosis to the right forehead EYES: Conjunctivae clear, PERRL, EOMI ENT: normal nose; no rhinorrhea; moist mucous membranes; pharynx without lesions noted; no dental injury; no septal hematoma, no epistaxis; no facial deformity or bony tenderness NECK: Supple, no midline spinal tenderness, step-off or deformity; trachea midline CARD: RRR; S1 and S2 appreciated; no murmurs, no clicks, no rubs, no gallops RESP: Normal chest excursion without splinting or tachypnea; breath sounds clear and equal bilaterally; no wheezes, no rhonchi, no rales; no hypoxia or respiratory distress CHEST:  chest wall stable, no crepitus or ecchymosis or deformity, nontender to palpation; no flail chest ABD/GI: Non-distended; soft, non-tender, no rebound, no guarding; no ecchymosis or other lesions noted PELVIS:  stable, nontender to palpation BACK:  The back appears normal; no midline spinal tenderness, step-off or deformity EXT: Normal ROM in all joints; no edema; normal capillary refill; no cyanosis, no bony tenderness or bony deformity of patient's extremities, no joint effusions, compartments are soft, extremities are  warm and well-perfused, no ecchymosis, no calf tenderness or calf swelling SKIN: Normal color for age and race; warm NEURO: No facial asymmetry, normal speech, moving all extremities equally, normal sensation diffusely  ED Results / Procedures / Treatments   LABS: (all labs ordered are listed, but only abnormal results are displayed) Labs Reviewed  COMPREHENSIVE METABOLIC PANEL WITH GFR - Abnormal; Notable for the following components:      Result Value   Glucose, Bld 128 (*)    BUN 25 (*)    All other components within normal limits  URINALYSIS, ROUTINE W REFLEX MICROSCOPIC - Abnormal;  Notable for the following components:   Color, Urine STRAW (*)    APPearance CLEAR (*)    All other components within normal limits  CBC  TROPONIN I (HIGH SENSITIVITY)  TROPONIN I (HIGH SENSITIVITY)     EKG:  EKG Interpretation Date/Time:  Sunday Feb 09 2024 00:16:34 EDT Ventricular Rate:  53 PR Interval:  206 QRS Duration:  84 QT Interval:  458 QTC Calculation: 429 R Axis:   100  Text Interpretation: Sinus bradycardia Rightward axis Borderline ECG When compared with ECG of 23-Apr-2018 17:39, No significant change was found Confirmed by Verneda Golder [phone]) on 02/09/2024 2:11:49 AM          RADIOLOGY: My personal review and interpretation of imaging: CT head and cervical spine showed no acute traumatic injury.  I have personally reviewed all radiology reports. CT HEAD WO CONTRAST Result Date: 02/09/2024 CLINICAL DATA:  Head trauma, moderate-severe; Neck trauma (Age >= 65y) Pt in after syncopal fall with head strike vs tile floor, no thinners. Pt states she had gotten up to walk to the bathroom, suddenly felt dizzy and passed out. EXAM: CT HEAD WITHOUT CONTRAST CT CERVICAL SPINE WITHOUT CONTRAST TECHNIQUE: Multidetector CT imaging of the head and cervical spine was performed following the standard protocol without intravenous contrast. Multiplanar CT image reconstructions of the cervical  spine were also generated. RADIATION DOSE REDUCTION: This exam was performed according to the departmental dose-optimization program which includes automated exposure control, adjustment of the mA and/or kV according to patient size and/or use of iterative reconstruction technique. COMPARISON:  None Available. FINDINGS: CT HEAD FINDINGS Brain: No evidence of large-territorial acute infarction. No parenchymal hemorrhage. No mass lesion. No extra-axial collection. No mass effect or midline shift. No hydrocephalus. Basilar cisterns are patent. Vascular: No hyperdense vessel. Atherosclerotic calcifications are present within the cavernous internal carotid arteries. Skull: No acute fracture or focal lesion. Sinuses/Orbits: Paranasal sinuses and mastoid air cells are clear. Right lens replacement. Otherwise the orbits are unremarkable. Other: None. CT CERVICAL SPINE FINDINGS Alignment: Normal. Skull base and vertebrae: Multilevel moderate severe degenerative changes spine. No associated severe osseous neural foraminal or central canal stenosis. No acute fracture. No aggressive appearing focal osseous lesion or focal pathologic process. Soft tissues and spinal canal: No prevertebral fluid or swelling. No visible canal hematoma. Upper chest: Biapical pleural/pulmonary scarring. Other: None. IMPRESSION: 1. No acute intracranial abnormality. 2. No acute displaced fracture or traumatic listhesis of the cervical spine. Electronically Signed   By: Morgane  Naveau M.D.   On: 02/09/2024 01:01   CT Cervical Spine Wo Contrast Result Date: 02/09/2024 CLINICAL DATA:  Head trauma, moderate-severe; Neck trauma (Age >= 65y) Pt in after syncopal fall with head strike vs tile floor, no thinners. Pt states she had gotten up to walk to the bathroom, suddenly felt dizzy and passed out. EXAM: CT HEAD WITHOUT CONTRAST CT CERVICAL SPINE WITHOUT CONTRAST TECHNIQUE: Multidetector CT imaging of the head and cervical spine was performed following  the standard protocol without intravenous contrast. Multiplanar CT image reconstructions of the cervical spine were also generated. RADIATION DOSE REDUCTION: This exam was performed according to the departmental dose-optimization program which includes automated exposure control, adjustment of the mA and/or kV according to patient size and/or use of iterative reconstruction technique. COMPARISON:  None Available. FINDINGS: CT HEAD FINDINGS Brain: No evidence of large-territorial acute infarction. No parenchymal hemorrhage. No mass lesion. No extra-axial collection. No mass effect or midline shift. No hydrocephalus. Basilar cisterns are patent. Vascular: No hyperdense vessel.  Atherosclerotic calcifications are present within the cavernous internal carotid arteries. Skull: No acute fracture or focal lesion. Sinuses/Orbits: Paranasal sinuses and mastoid air cells are clear. Right lens replacement. Otherwise the orbits are unremarkable. Other: None. CT CERVICAL SPINE FINDINGS Alignment: Normal. Skull base and vertebrae: Multilevel moderate severe degenerative changes spine. No associated severe osseous neural foraminal or central canal stenosis. No acute fracture. No aggressive appearing focal osseous lesion or focal pathologic process. Soft tissues and spinal canal: No prevertebral fluid or swelling. No visible canal hematoma. Upper chest: Biapical pleural/pulmonary scarring. Other: None. IMPRESSION: 1. No acute intracranial abnormality. 2. No acute displaced fracture or traumatic listhesis of the cervical spine. Electronically Signed   By: Morgane  Naveau M.D.   On: 02/09/2024 01:01     PROCEDURES:  Critical Care performed: No   CRITICAL CARE Performed by: Starling Eck Jenesis Suchy   Total critical care time: 0 minutes  Critical care time was exclusive of separately billable procedures and treating other patients.  Critical care was necessary to treat or prevent imminent or life-threatening  deterioration.  Critical care was time spent personally by me on the following activities: development of treatment plan with patient and/or surrogate as well as nursing, discussions with consultants, evaluation of patient's response to treatment, examination of patient, obtaining history from patient or surrogate, ordering and performing treatments and interventions, ordering and review of laboratory studies, ordering and review of radiographic studies, pulse oximetry and re-evaluation of patient's condition.   Aaron Aas1-3 Lead EKG Interpretation  Performed by: Bryona Foxworthy, Clover Dao, DO Authorized by: Eriyah Fernando, Clover Dao, DO     Interpretation: abnormal     ECG rate:  43   ECG rate assessment: bradycardic     Rhythm: sinus bradycardia     Ectopy: none     Conduction: normal       IMPRESSION / MDM / ASSESSMENT AND PLAN / ED COURSE  I reviewed the triage vital signs and the nursing notes.  Patient here after a syncopal event now with postconcussive symptoms.  The patient is on the cardiac monitor to evaluate for evidence of arrhythmia and/or significant heart rate changes.   DIFFERENTIAL DIAGNOSIS (includes but not limited to):   Orthostasis, dehydration, anemia, electrolyte derangement, ACS, arrhythmia, worsening heart failure, postconcussive syndrome, skull fracture, intracranial hemorrhage, cervical spine fracture  Patient's presentation is most consistent with acute presentation with potential threat to life or bodily function.  PLAN: Will obtain cardiac labs, urine, CT of the head and cervical spine.  EKG nonischemic without arrhythmia, interval abnormality.  She is bradycardic but this is her baseline.  Normotensive currently.  Will check orthostatic vital signs.  Will give IV fluids, Zofran , meclizine, Tylenol  for symptomatic relief.   MEDICATIONS GIVEN IN ED: Medications  sodium chloride  0.9 % bolus 1,000 mL (0 mLs Intravenous Stopped 02/09/24 0512)  acetaminophen  (TYLENOL ) tablet 1,000 mg  (1,000 mg Oral Given 02/09/24 0236)  ondansetron  (ZOFRAN ) injection 4 mg (4 mg Intravenous Given 02/09/24 0235)  meclizine (ANTIVERT) tablet 25 mg (25 mg Oral Given 02/09/24 0236)     ED COURSE: Troponin x 2 negative.  Normal hemoglobin, electrolytes.  CT head and cervical spine reviewed and interpreted by myself and the radiologist and show no acute abnormality.  Urine shows no sign of infection.  No arrhythmias noted on monitoring today.  She reports feeling better and has been able to tolerate p.o. and ambulate here.  Again suspect some component of orthostasis given episode happened immediately after standing up from bed.  We  did discuss the possibility of admission for observation given her history of CHF but patient would prefer discharge home which I feel is very reasonable.  Discussed supportive care instructions, return precautions.  Discussed postconcussive syndrome and brain rest.  She does have a PCP for close follow-up in the next 1 to 2 weeks.   At this time, I do not feel there is any life-threatening condition present. I reviewed all nursing notes, vitals, pertinent previous records.  All lab and urine results, EKGs, imaging ordered have been independently reviewed and interpreted by myself.  I reviewed all available radiology reports from any imaging ordered this visit.  Based on my assessment, I feel the patient is safe to be discharged home without further emergent workup and can continue workup as an outpatient as needed. Discussed all findings, treatment plan as well as usual and customary return precautions.  They verbalize understanding and are comfortable with this plan.  Outpatient follow-up has been provided as needed.  All questions have been answered.    CONSULTS: Admission considered but patient would prefer discharge home with close outpatient follow-up.   OUTSIDE RECORDS REVIEWED: Reviewed last family medicine note on 01/28/2024.       FINAL CLINICAL IMPRESSION(S) /  ED DIAGNOSES   Final diagnoses:  Syncope and collapse  Injury of head, initial encounter     Rx / DC Orders   ED Discharge Orders          Ordered    meclizine (ANTIVERT) 25 MG tablet  3 times daily PRN        02/09/24 0503    ondansetron  (ZOFRAN -ODT) 4 MG disintegrating tablet  Every 6 hours PRN        02/09/24 0503             Note:  This document was prepared using Dragon voice recognition software and may include unintentional dictation errors.   Alric Geise, Clover Dao, DO 02/09/24 [phone]

## 2024-02-09 NOTE — Discharge Instructions (Addendum)
 You have had a head injury resulting in a concussion.  A concussion is a clinical diagnosis and not seen on imaging (CT, MRI).  Please avoid alcohol, sedatives for the next week.  Please rest and drink plenty of water.  We recommend that you avoid any activity that may lead to another head injury for at least 1 week or until your symptoms have completely resolved.  We also recommend "brain rest" to ensure the best possible long term outcomes - please avoid TV, cell phones, tablets, computers as much as possible for the next 48 hours.    You may take over-the-counter Tylenol  1000 mg every 6 hours as needed for pain.

## 2024-02-10 ENCOUNTER — Encounter: Payer: Self-pay | Admitting: Physician Assistant

## 2024-02-10 ENCOUNTER — Ambulatory Visit: Attending: Physician Assistant | Admitting: Physician Assistant

## 2024-02-10 VITALS — BP 125/75 | HR 50 | Ht 66.0 in | Wt 132.6 lb

## 2024-02-10 DIAGNOSIS — R072 Precordial pain: Secondary | ICD-10-CM

## 2024-02-10 DIAGNOSIS — I5032 Chronic diastolic (congestive) heart failure: Secondary | ICD-10-CM

## 2024-02-10 DIAGNOSIS — R001 Bradycardia, unspecified: Secondary | ICD-10-CM

## 2024-02-10 DIAGNOSIS — I471 Supraventricular tachycardia, unspecified: Secondary | ICD-10-CM | POA: Diagnosis not present

## 2024-02-10 DIAGNOSIS — I2510 Atherosclerotic heart disease of native coronary artery without angina pectoris: Secondary | ICD-10-CM | POA: Diagnosis not present

## 2024-02-10 DIAGNOSIS — R55 Syncope and collapse: Secondary | ICD-10-CM

## 2024-02-10 MED ORDER — METOPROLOL TARTRATE 25 MG PO TABS: ORAL_TABLET | ORAL | 0 refills | Status: DC

## 2024-02-10 NOTE — Patient Instructions (Addendum)
 Medication Instructions:  Your Physician recommend you continue on your current medication as directed.    *If you need a refill on your cardiac medications before your next appointment, please call your pharmacy*  Lab Work: Your provider would like for you to have following labs drawn today Basic Metabolic Panel.   If you have labs (blood work) drawn today and your tests are completely normal, you will receive your results only by: MyChart Message (if you have MyChart) OR A paper copy in the mail If you have any lab test that is abnormal or we need to change your treatment, we will call you to review the results.  Testing/Procedures: Your cardiac CT will be scheduled at :   [facility] [address] [place], [place] [address] [phone]  When scheduled at  [facility], please arrive 15 mins early for check-in and test prep.  Please follow these instructions carefully (unless otherwise directed):  An IV will be required for this test and Nitroglycerin will be given.  Hold all erectile dysfunction medications at least 3 days (72 hrs) prior to test. (Ie viagra, cialis, sildenafil, tadalafil, etc)   On the Night Before the Test: Be sure to Drink plenty of water. Do not consume any caffeinated/decaffeinated beverages or chocolate 12 hours prior to your test. Do not take any antihistamines 12 hours prior to your test.  On the Day of the Test: Drink plenty of water until 1 hour prior to the test. Do not eat any food 1 hour prior to test. You may take your regular medications prior to the test.  Take metoprolol (Lopressor) two hours prior to test. If you take Furosemide /Hydrochlorothiazide/Spironolactone/Chlorthalidone, please HOLD on the morning of the test. Patients who wear a continuous glucose monitor MUST remove the device prior to scanning. FEMALES- please wear underwire-free bra if available, avoid dresses & tight clothing        After the Test: Drink plenty of water. After receiving IV contrast, you may experience a mild flushed feeling. This is normal. On occasion, you may experience a mild rash up to 24 hours after the test. This is not dangerous. If this occurs, you can take Benadryl 25 mg, Zyrtec, Claritin, or Allegra and increase your fluid intake. (Patients taking Tikosyn should avoid Benadryl, and may take Zyrtec, Claritin, or Allegra) If you experience trouble breathing, this can be serious. If it is severe call 911 IMMEDIATELY. If it is mild, please call our office.  We will call to schedule your test 2-4 weeks out understanding that some insurance companies will need an authorization prior to the service being performed.   For more information and frequently asked questions, please visit our website : [email or web address]  For non-scheduling related questions, please contact the cardiac imaging nurse navigator should you have any questions/concerns: Cardiac Imaging Nurse Navigators Direct Office Dial: [phone]   For scheduling needs, including cancellations and rescheduling, please call [place], [phone].    Your physician has requested that you have an echocardiogram. Echocardiography is a painless test that uses sound waves to create images of your heart. It provides your doctor with information about the size and shape of your heart and how well your heart's chambers and valves are working.   You may receive an ultrasound enhancing agent through an IV if needed to better visualize your heart during the echo. This procedure takes approximately one hour.  There are no restrictions for this procedure.  This will take place at [address] ([facility] [address], [place] [address]  Please note: We ask at that you not bring children with you during ultrasound (echo/ vascular) testing. Due to room size and safety concerns, children are not allowed in the ultrasound rooms  during exams. Our front office staff cannot provide observation of children in our lobby area while testing is being conducted. An adult accompanying a patient to their appointment will only be allowed in the ultrasound room at the discretion of the ultrasound technician under special circumstances. We apologize for any inconvenience.   Follow-Up: At [facility], you and your health needs are our priority.  As part of our continuing mission to provide you with exceptional heart care, our providers are all part of one team.  This team includes your primary Cardiologist (physician) and Advanced Practice Providers or APPs (Physician Assistants and Nurse Practitioners) who all work together to provide you with the care you need, when you need it.  Your next appointment:   3 month(s)  Provider:   You may see Antionette Kirks, MD or one of the following Advanced Practice Providers on your designated Care Team:   Laneta Pintos, NP Gildardo Labrador, PA-C Varney Gentleman, PA-C Cadence Blackfoot, PA-C Ronald Cockayne, NP Morey Ar, NP    We recommend signing up for the patient portal called "MyChart".  Sign up information is provided on this After Visit Summary.  MyChart is used to connect with patients for Virtual Visits (Telemedicine).  Patients are able to view lab/test results, encounter notes, upcoming appointments, etc.  Non-urgent messages can be sent to your provider as well.   To learn more about what you can do with MyChart, go to [email or web address].   Other Instructions  We have requested a pre-certification from your insurance for the Loop [employer] and we have referred you to our Electrophysiology providers.

## 2024-02-10 NOTE — Progress Notes (Signed)
 Cardiology Office Note    Date:  02/10/2024   ID:  VALMAI VONGUNTEN, DOB 07/07/1951, MRN [id number]  PCP:  Gabriel John, NP  Cardiologist:  Antionette Kirks, MD  Electrophysiologist:  None   Chief Complaint: ED follow up  History of Present Illness:   Morgan Herrera is a 73 y.o. female with history of chest pain with suspected endothelial dysfunction, PSVT, sinus bradycardia, multiple sclerosis, optic neuritis, polymyalgia rheumatica, migraines, sleep apnea, and remote tobacco use who presents for ED follow up of syncope.    She was admitted in 03/2018 with fatigue, shortness of breath, and chest pain.  D-dimer was elevated.  CTA of the chest showed no evidence of PE.  BNP was mildly elevated.  Echo demonstrated a preserved LV systolic function.  Stress test was aborted due to hypertensive response with exercise.  LHC showed normal coronary arteries with sluggish flow in the LAD suggestive of endothelial dysfunction.  EF was normal.  LVEDP was mildly elevated.  She did not tolerate long-acting nitroglycerin due to headache.  PFTs in 07/2018 showed no evidence of obstructive or restrictive lung disease.  Repeat echo in 03/2019 showed normal LV systolic function and diastolic function with mild to moderate mitral regurgitation.  She underwent Zio patch in 08/2020, due to intermittent palpitations, which showed a normal sinus rhythm with an average heart rate of 58 bpm with a low heart rate of 39 bpm in the morning, 166 episodes of SVT with the longest episode lasting 3 minutes and 15 seconds with an average rate of 115 bpm and correlated with symptoms.  She was last seen in the office in 08/2022 noting stable chronic fatigue and dyspnea with chronic back pain and posterior shoulder/neck discomfort that had been present for several years.  Regarding her longstanding sinus bradycardia, she demonstrated appropriate chronotropic competence in the office and labs were stable.  There was no  indication for PPM.    She was seen in the ED on 02/09/2024 after standing up to go to the restroom overnight, got lightheaded, and passed out, hitting her head.  Vitals in the ED with a BP of 147/54 and a heart rate of 54 bpm.  EKG showed sinus bradycardia with a first-degree AV block with a heart rate of 53 bpm.  High-sensitivity troponin negative x 2.  CT head and cervical spine without acute processes.  She comes in today reporting over the past 6 months that she has noted an increase in the longstanding exertional shortness of breath and dizziness with associated presyncope.  The symptoms are increasing in frequency and are more pronounced than what she has historically experienced.  Symptoms began along the midline of the chest and radiate outward towards the bilateral shoulders.  She does report she is able to get her heart rate up into the 80s bpm with ambulation at times.  She has also noted some substernal chest discomfort that has occurred with rest.  With regards to her syncopal episode around 11 PM on 02/08/2024, she indicates this was a sudden onset after standing and taking a couple of steps to go to the restroom after waking up from sleep.  She does not recall having any chest pain or palpitations surrounding this syncopal episode.  Reports that MS has been well-controlled and is currently off of pharmacotherapy under the direction of her neurologist.   Labs independently reviewed: 01/2024 - Hgb 12.8, PLT 238, potassium 3.5, BUN 25, serum creatinine 0.79, albumin 4.1, AST/ALT normal 01/2023 -  A1c 5.8, TC 210, TG 143, HDL 74, LDL 107 08/2022 - TSH normal  Past Medical History:  Diagnosis Date   (HFpEF) heart failure with preserved ejection fraction (HCC)    a. 03/2018 Echo: EF 55-60%, no rwma. Mod MR. Nl PASP; b. 03/2019 Echo: EF 60-65%, ? DD, mod MR.   Acute CHF (congestive heart failure) (HCC) 04/23/2018   Chest pain    a. 03/2018 Ex MV: rest only imaging showed ant wall perfusion defect.  No stress imaging as pt developed hypotension w/ exercise; b. 05/2018 Cath: Nl cors w/ sluggish flow in the LAD suggestive of endothelial dysfxn. EF 55-65%.   Full body hives 09/12/2020   Loud snoring 02/02/2022   Migraine    Multiple sclerosis (HCC)    Neuromuscular disorder (HCC)    PMR (polymyalgia rheumatica) (HCC)    PND (post-nasal drip) 04/16/2022   Pulmonary nodule    a. 03/2018 CTA Chest: 9mm small spiculated nodular density @ the L lung apex. Rec f/u in 3 mos.   Puncture wound 03/04/2020   Relapsing remitting multiple sclerosis (HCC) 12/13/2015   Shortness of breath 04/30/2018    Past Surgical History:  Procedure Laterality Date   BREAST EXCISIONAL BIOPSY     BREAST SURGERY  1990   COLONOSCOPY WITH PROPOFOL  N/A 05/19/2015   Procedure: COLONOSCOPY WITH PROPOFOL ;  Surgeon: Stephens Eis, MD;  Location: [facility] ENDOSCOPY;  Service: Gastroenterology;  Laterality: N/A;   EYE SURGERY     1978 1st of 3 eye surgeries   FOOT SURGERY Left 10/30/2022   LEFT HEART CATH AND CORONARY ANGIOGRAPHY N/A 04/25/2018   Procedure: LEFT HEART CATH AND CORONARY ANGIOGRAPHY;  Surgeon: Wenona Hamilton, MD;  Location: [facility] INVASIVE CV LAB;  Service: Cardiovascular;  Laterality: N/A;   TENDON REPAIR      Current Medications: Current Meds  Medication Sig   calcium carbonate (OS-CAL - DOSED IN MG OF ELEMENTAL CALCIUM) 1250 (500 Ca) MG tablet Take 1 tablet by mouth daily.   cholecalciferol (VITAMIN D ) 1000 units tablet Take 2,000 Units by mouth daily.   cyanocobalamin  500 MCG tablet Take 500 mcg by mouth daily.   escitalopram  (LEXAPRO ) 10 MG tablet TAKE 1 TABLET BY MOUTH DAILY FOR DEPRESSION   furosemide  (LASIX ) 20 MG tablet TAKE 1 TABLET BY MOUTH EVERY DAY   gabapentin  (NEURONTIN ) 100 MG capsule TAKE 1 CAPSULE BY MOUTH THREE TIMES A DAY   Magnesium 250 MG TABS Take by mouth.   meclizine (ANTIVERT) 25 MG tablet Take 1 tablet (25 mg total) by mouth 3 (three) times daily as needed.   Omega-3 Fatty Acids  (FISH OIL PEARLS PO) Take 1,400 mg by mouth daily.   ondansetron  (ZOFRAN -ODT) 4 MG disintegrating tablet Take 1 tablet (4 mg total) by mouth every 6 (six) hours as needed for nausea or vomiting.   traZODone  (DESYREL ) 50 MG tablet Take 1 tablet (50 mg total) by mouth at bedtime. For sleep.   [DISCONTINUED] metoprolol tartrate (LOPRESSOR) 25 MG tablet TAKE 1 TABLET 2 HR PRIOR TO CARDIAC PROCEDURE    Allergies:   Patient has no known allergies.   Social History   Socioeconomic History   Marital status: Married    Spouse name: Not on file   Number of children: 1   Years of education: Not on file   Highest education level: Bachelor's degree (e.g., BA, AB, BS)  Occupational History   Not on file  Tobacco Use   Smoking status: Former    Current packs/day:  0.00    Average packs/day: 1 pack/day for 45.0 years (45.0 ttl pk-yrs)    Types: Cigarettes    Start date: 20    Quit date: 2000    Years since quitting: 25.3   Smokeless tobacco: Never  Vaping Use   Vaping status: Never Used  Substance and Sexual Activity   Alcohol use: Yes    Alcohol/week: 0.0 standard drinks of alcohol    Comment: 1 to 2 glass of wine at least 4 nights a week   Drug use: No   Sexual activity: Not on file  Other Topics Concern   Not on file  Social History Narrative   Married.   Moved here from [place]. [facility] after 35 years   Enjoys arts and crafts. Taking a painting class.   Walking with her spouse.    Reading, shopping.   Right handed   Social Drivers of Health   Financial Resource Strain: Low Risk  (11/28/2023)   Overall Financial Resource Strain (CARDIA)    Difficulty of Paying Living Expenses: Not hard at all  Food Insecurity: No Food Insecurity (11/28/2023)   Hunger Vital Sign    Worried About Running Out of Food in the Last Year: Never true    Ran Out of Food in the Last Year: Never true  Transportation Needs: No Transportation Needs (11/28/2023)   PRAPARE - Scientist, research (physical sciences) (Medical): No    Lack of Transportation (Non-Medical): No  Physical Activity: Sufficiently Active (11/28/2023)   Exercise Vital Sign    Days of Exercise per Week: 5 days    Minutes of Exercise per Session: 60 min  Stress: Stress Concern Present (11/28/2023)   [employer] of Occupational Health - Occupational Stress Questionnaire    Feeling of Stress : To some extent  Social Connections: Moderately Isolated (11/28/2023)   Social Connection and Isolation Panel [NHANES]    Frequency of Communication with Friends and Family: More than three times a week    Frequency of Social Gatherings with Friends and Family: More than three times a week    Attends Religious Services: Never    Database administrator or Organizations: No    Attends Engineer, structural: Never    Marital Status: Married     Family History:  The patient's family history includes Asthma in her father; Breast cancer in her maternal grandmother; Cancer in her maternal grandmother; Heart disease in her mother; Hypertension in her mother; Parkinson's disease in her mother; Parkinsonism in her mother.  ROS:   12-point review of systems is negative unless otherwise noted in the HPI.   EKGs/Labs/Other Studies Reviewed:    Studies reviewed were summarized above. The additional studies were reviewed today:  Zio patch 08/2020: Normal sinus rhythm with an average heart rate 58 bpm.  Lowest heart rate was 39 bpm at 7:18 in the morning. 166 episodes of SVT noted.  The longest lasted 3 minutes and 15 seconds with an average rate of 115 bpm.  Most of these episodes were associated with symptoms of shortness of breath and palpitations. __________   2D echo 03/18/2019: 1. The left ventricle has normal systolic function with an ejection  fraction of 60-65%. The cavity size was normal. Left ventricular diastolic  Doppler parameters are consistent with pseudonormalization.   2. The right ventricle has normal  systolic function. The cavity was  normal. There is no increase in right ventricular wall thickness.Unable to  estimate RVSP.   3.  Mitral valve regurgitation is moderate. Unable to exclude mild valve  prolapse.  __________   [facility] 04/25/2018: The left ventricular systolic function is normal. LV end diastolic pressure is mildly elevated. The left ventricular ejection fraction is 55-65% by visual estimate.   1.  Normal coronary arteries.  Sluggish flow in the LAD suggestive of endothelial dysfunction. 2.  Normal LV systolic function.  Mildly elevated left ventricular end-diastolic pressure at 18 mmHg.  No significant mitral regurgitation   Recommendations: No significant obstructive coronary artery disease to explain the patient's symptoms.  She likely has mild degree of diastolic heart failure.  Recommend continuing small dose furosemide . Small dose long-acting nitroglycerin can be considered for suspected endothelial dysfunction.  __________   [facility] 04/24/2018: Rest only images with anterior wall perfusion defect. The  patient was supposed to have a treadmill nuclear stress test but had hypotensive response with exercise and thus was not injected with stress dose.  The test was canceled and cardiac catheterization was recommended. __________   2D echo 04/24/2018: - Left ventricle: The cavity size was normal. Wall thickness was    normal. Systolic function was normal. The estimated ejection    fraction was in the range of 55% to 60%. Wall motion was normal;    there were no regional wall motion abnormalities.  - Mitral valve: There was moderate regurgitation.  - Pulmonary arteries: Systolic pressure was within the normal    range.    EKG:  The EKG ordered 02/09/2024 demonstrated sinus bradycardia, 53 bpm, first-degree AV block, right axis deviation, nonspecific ST-T changes  Recent Labs: 02/09/2024: ALT 22; BUN 25; Creatinine, Ser 0.79; Hemoglobin 12.8; Platelets 238; Potassium  3.5; Sodium 137  Recent Lipid Panel    Component Value Date/Time   CHOL 210 (H) 02/14/2023 1033   TRIG 143.0 02/14/2023 1033   HDL 74.30 02/14/2023 1033   CHOLHDL 3 02/14/2023 1033   VLDL 28.6 02/14/2023 1033   LDLCALC 107 (H) 02/14/2023 1033    PHYSICAL EXAM:    VS:  BP 125/75   Pulse (!) 50   Ht 5\' 6"  (1.676 m)   Wt 132 lb 9.6 oz (60.1 kg)   SpO2 98%   BMI 21.40 kg/m   BMI: Body mass index is 21.4 kg/m.  Physical Exam Vitals reviewed.  Constitutional:      Appearance: She is well-developed.  HENT:     Head: Normocephalic and atraumatic.  Eyes:     General:        Right eye: No discharge.        Left eye: No discharge.  Cardiovascular:     Rate and Rhythm: Regular rhythm. Bradycardia present.     Heart sounds: Normal heart sounds, S1 normal and S2 normal. Heart sounds not distant. No midsystolic click and no opening snap. No murmur heard.    No friction rub.  Pulmonary:     Effort: Pulmonary effort is normal. No respiratory distress.     Breath sounds: Normal breath sounds. No decreased breath sounds, wheezing, rhonchi or rales.  Chest:     Chest wall: No tenderness.  Musculoskeletal:     Cervical back: Normal range of motion.     Right lower leg: No edema.     Left lower leg: No edema.  Skin:    General: Skin is warm and dry.     Nails: There is no clubbing.  Neurological:     Mental Status: She is alert and oriented to person, place, and  time.  Psychiatric:        Speech: Speech normal.        Behavior: Behavior normal.        Thought Content: Thought content normal.        Judgment: Judgment normal.     Wt Readings from Last 3 Encounters:  02/10/24 132 lb 9.6 oz (60.1 kg)  02/09/24 131 lb 12.8 oz (59.8 kg)  01/28/24 131 lb 12.8 oz (59.8 kg)    Orthostatic vital signs: Lying: 123/74, 46 bpm Sitting: 136/74, 48 bpm Standing: 123/78, 51 bpm Standing x 3 minutes: 118/80, 54 bpm  ASSESSMENT & PLAN:   Syncope: Appears to be consistent with  orthostatic episode after waking up from sleep and standing up to use the restroom.  Orthostatic vital signs are negative in the office today.  However, she does report an increase in exertional near syncope over the past 6 months.  Obtain echo to evaluate for new structural abnormality.  In the context of precordial pain and exertional dyspnea we will also obtain a coronary CTA to evaluate for ischemic heart disease.  Would not pursue Lexiscan MPI or myocardial PET/CT at this time given need to exclude evidence of high-grade AV block.  Defer treadmill stress test given prior hypertensive response that was nondiagnostic.  We will not administer beta-blocker prior to coronary CTA given resting bradycardic heart rates.  We did initially recommend outpatient external cardiac monitoring, however she reports diffuse urticaria with prior Zio patch that resolved within 1 day of removing external cardiac monitor.  Given this, we will refer her to EP for consideration of loop recorder implantation for syncope evaluation and for continued monitoring given the bradycardic rates and exertional near syncope.  Sinus bradycardia: Longstanding issue.  Not on rate limiting medications.  Demonstrated appropriate chronotropic competence in the office today with heart rate trending to 77 bpm.  Hemodynamically stable.  Needs further cardiac monitoring as outlined above.  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  PSVT: Quiescent.  Prior outpatient cardiac monitoring showed the longest episode lasting just over 3 minutes.  Given baseline bradycardia, she has not been on beta-blocker or nondihydropyridine calcium channel blocker.  Burden not significant enough to consider antiarrhythmic therapy or ablation.  HFpEF: Euvolemic on low-dose furosemide  20 mg daily.  Labs obtained yesterday show stable renal function and electrolytes.    Disposition:  F/u with Dr. Alvenia Aus or an APP in 3 months.   Medication Adjustments/Labs and Tests Ordered: Current medicines are reviewed at length with the patient today.  Concerns regarding medicines are outlined above. Medication changes, Labs and Tests ordered today are summarized above and listed in the Patient Instructions accessible in Encounters.   Signed, Varney Gentleman, PA-C 02/10/2024 1:25 PM     [facility] [address] [place], [place] [address] [phone]

## 2024-02-11 ENCOUNTER — Telehealth: Payer: Self-pay

## 2024-02-11 ENCOUNTER — Ambulatory Visit: Payer: Self-pay

## 2024-02-11 LAB — BASIC METABOLIC PANEL WITH GFR
BUN/Creatinine Ratio: 28 (ref 12–28)
BUN: 22 mg/dL (ref 8–27)
CO2: 25 mmol/L (ref 20–29)
Calcium: 9.8 mg/dL (ref 8.7–10.3)
Chloride: 102 mmol/L (ref 96–106)
Creatinine, Ser: 0.78 mg/dL (ref 0.57–1.00)
Glucose: 87 mg/dL (ref 70–99)
Potassium: 4.7 mmol/L (ref 3.5–5.2)
Sodium: 141 mmol/L (ref 134–144)
eGFR: 81 mL/min/{1.73_m2} (ref >59)

## 2024-02-11 NOTE — Transitions of Care (Post Inpatient/ED Visit) (Unsigned)
   02/11/2024  Name: Morgan Herrera MRN: [id number] DOB: 25-Dec-1950  Today's TOC FU Call Status: Today's TOC FU Call Status:: Unsuccessful Call (1st Attempt) Unsuccessful Call (1st Attempt) Date: 02/11/24  Attempted to reach the patient regarding the most recent Inpatient/ED visit.  Follow Up Plan: Additional outreach attempts will be made to reach the patient to complete the Transitions of Care (Post Inpatient/ED visit) call.   Signature Darrall Ellison, LPN [facility] Nurse Health Advisor Direct Dial [phone]

## 2024-02-12 ENCOUNTER — Telehealth: Payer: Self-pay | Admitting: Physician Assistant

## 2024-02-12 NOTE — Telephone Encounter (Signed)
 Pt called and reports she had received metoprolol from pharmacy and wanted to make sure she should NOT take it as that was discussed at her after visit summary.  Confirmed with pt metoprolol is NOT to be taken, med was canceled prior to AVS review but pharmacy filled it.

## 2024-02-12 NOTE — Telephone Encounter (Signed)
 Pt c/o medication issue:  1. Name of Medication: Metoprolol  2. How are you currently taking this medication (dosage and times per day)?   3. Are you having a reaction (difficulty breathing--STAT)?   4. What is your medication issue? Pt had a call from the pharmacy about this medication and she has questions

## 2024-02-12 NOTE — Transitions of Care (Post Inpatient/ED Visit) (Signed)
 02/12/2024  Name: Morgan Herrera MRN: [id number] DOB: [birth year]  Today's TOC FU Call Status: Today's TOC FU Call Status:: Successful TOC FU Call Completed Unsuccessful Call (1st Attempt) Date: 02/11/24 [facility] FU Call Complete Date: 02/12/24 Patient's Name and Date of Birth confirmed.  Transition Care Management Follow-up Telephone Call Date of Discharge: 02/09/24 Discharge Facility: [facility] Type of Discharge: Emergency Department Reason for ED Visit: Other: (syncope) How have you been since you were released from the hospital?: Better Any questions or concerns?: No  Items Reviewed: Did you receive and understand the discharge instructions provided?: Yes Medications obtained,verified, and reconciled?: Yes (Medications Reviewed) Any new allergies since your discharge?: No Dietary orders reviewed?: Yes Do you have support at home?: Yes People in Home [RPT]: spouse  Medications Reviewed Today: Medications Reviewed Today     Reviewed by Darrall Ellison, LPN (Licensed Practical Nurse) on 02/12/24 at 1152  Med List Status: <None>   Medication Order Taking? Sig Documenting Provider Last Dose Status Informant  amoxicillin -clavulanate (AUGMENTIN ) 875-125 MG tablet [id number]  Take 1 tablet by mouth 2 (two) times daily. Dugal, Tabitha, FNP  Active   calcium carbonate (OS-CAL - DOSED IN MG OF ELEMENTAL CALCIUM) 1250 (500 Ca) MG tablet [id number] Yes Take 1 tablet by mouth daily. [provider] Taking Active Self  cholecalciferol (VITAMIN D ) 1000 units tablet [id number] Yes Take 2,000 Units by mouth daily. [provider] Taking Active Self  cyanocobalamin  500 MCG tablet [id number] Yes Take 500 mcg by mouth daily. [provider] Taking Active Self  escitalopram  (LEXAPRO ) 10 MG tablet [id number] Yes TAKE 1 TABLET BY MOUTH DAILY FOR DEPRESSION Clark, Katherine K, NP Taking Active   furosemide  (LASIX ) 20 MG tablet [id number] Yes TAKE 1  TABLET BY MOUTH EVERY DAY Clark, Katherine K, NP Taking Active   gabapentin  (NEURONTIN ) 100 MG capsule [id number] Yes TAKE 1 CAPSULE BY MOUTH THREE TIMES A DAY Jaffe, Adam R, DO Taking Active            Med Note Bambi Lever, [facility] J   Wed Oct 16, 2023  8:36 AM) As needed  Magnesium 250 MG TABS [id number] Yes Take by mouth. [provider] Taking Active   meclizine (ANTIVERT) 25 MG tablet [id number] Yes Take 1 tablet (25 mg total) by mouth 3 (three) times daily as needed. Ward, Clover Dao, DO Taking Active   Omega-3 Fatty Acids (FISH OIL PEARLS PO) [id number] Yes Take 1,400 mg by mouth daily. [provider] Taking Active Self  ondansetron  (ZOFRAN -ODT) 4 MG disintegrating tablet [id number] Yes Take 1 tablet (4 mg total) by mouth every 6 (six) hours as needed for nausea or vomiting. Ward, Clover Dao, DO Taking Active   traZODone  (DESYREL ) 50 MG tablet [id number] Yes Take 1 tablet (50 mg total) by mouth at bedtime. For sleep. Gabriel John, NP Taking Active             Home Care and Equipment/Supplies: Were Home Health Services Ordered?: NA Any new equipment or medical supplies ordered?: NA  Functional Questionnaire: Do you need assistance with bathing/showering or dressing?: No Do you need assistance with meal preparation?: No Do you need assistance with eating?: No Do you have difficulty maintaining continence: No Do you need assistance with getting out of bed/getting out of a chair/moving?: No Do you have difficulty managing or taking your medications?: No  Follow up appointments reviewed: PCP Follow-up appointment confirmed?: No (declined) MD Provider Line [phone] Given: No Specialist Hospital Follow-up appointment  confirmed?: Yes Date of Specialist follow-up appointment?: 02/10/24 Follow-Up Specialty Provider:: cardio Do you need transportation to your follow-up appointment?: No Do you understand care options if your condition(s) worsen?: Yes-patient  verbalized understanding    SIGNATURE Darrall Ellison, LPN [facility] Nurse Health Advisor Direct Dial [phone]

## 2024-02-17 ENCOUNTER — Ambulatory Visit: Attending: Physician Assistant

## 2024-02-17 DIAGNOSIS — R55 Syncope and collapse: Secondary | ICD-10-CM

## 2024-02-17 LAB — ECHOCARDIOGRAM COMPLETE
AR max vel: 2.15 cm2
AV Area VTI: 2.2 cm2
AV Area mean vel: 2.12 cm2
AV Mean grad: 2 mmHg
AV Peak grad: 4.6 mmHg
Ao pk vel: 1.07 m/s
Area-P 1/2: 3.65 cm2
S' Lateral: 2.98 cm

## 2024-02-19 ENCOUNTER — Ambulatory Visit: Payer: PPO | Admitting: Neurology

## 2024-02-23 ENCOUNTER — Other Ambulatory Visit: Payer: Self-pay | Admitting: Primary Care

## 2024-02-23 DIAGNOSIS — G4700 Insomnia, unspecified: Secondary | ICD-10-CM

## 2024-02-24 NOTE — Progress Notes (Signed)
 " Electrophysiology Office Note:   Date:  02/25/2024  ID:  Morgan Herrera, DOB 01-25-1951, MRN [id number]  Primary Cardiologist: Deatrice Cage, MD Electrophysiologist: Fonda Kitty, MD      History of Present Illness:   Morgan Herrera is a 73 y.o. female with h/o PSVT, sinus bradycardia, multiple sclerosis, PMR, migraines, sleep apnea and syncope who is being seen today for evaluation for loop recorder implant.   Discussed the use of AI scribe software for clinical note transcription with the patient, who gave verbal consent to proceed.  History of Present Illness Morgan Herrera is a 73 year old female who presents with worsening dizziness and shortness of breath. She has been experiencing dizziness and shortness of breath over the past six to eight months, particularly during physical activities such as walking or exercising. The dizziness involves seeing spots and things getting black, requiring her to stop and bend over to alleviate symptoms. There is a history of a single syncopal episode, and dizziness has become more prominent recently. She exercises regularly, walking several miles a day, but has had to slow down due to shortness of breath and recent foot surgery. A recent attempt to wear a cardiac monitor was unsuccessful due to a rash that developed early on, preventing adequate data collection. Her watch occasionally notifies her of a low heart rate, particularly when she is at rest or asleep, but not during activity. She has a baseline slow heart rate, which has been a concern in relation to her symptoms of dizziness and lightheadedness. No dizziness while sitting still. Her heart rate fluctuates during exercise, and she experiences low heart rate notifications from her watch when at rest.   Review of systems complete and found to be negative unless listed in HPI.   EP Information / Studies Reviewed:    EKG is ordered today. Personal review as below.  EKG  Interpretation Date/Time:  Tuesday Feb 25 2024 07:58:05 EDT Ventricular Rate:  47 PR Interval:  184 QRS Duration:  80 QT Interval:  464 QTC Calculation: 410 R Axis:   92  Text Interpretation: Sinus bradycardia with Premature atrial complexes Rightward axis When compared with ECG of 09-Feb-2024 00:16, Premature atrial complexes are now Present Confirmed by Kitty Fonda [phone]) on 02/25/2024 8:09:06 AM   Echo 02/17/2024: Normal LV function, LVEF 55 to 60%. Normal RV size and function. No significant valvular disease.  Zio 08/2021:        Physical Exam:   VS:  BP 100/66   Pulse (!) 47   Ht 5' 6 (1.676 m)   Wt 131 lb 12.8 oz (59.8 kg)   SpO2 97%   BMI 21.27 kg/m    Wt Readings from Last 3 Encounters:  02/25/24 131 lb 12.8 oz (59.8 kg)  02/10/24 132 lb 9.6 oz (60.1 kg)  02/09/24 131 lb 12.8 oz (59.8 kg)     GEN: Well nourished, well developed in no acute distress NECK: No JVD CARDIAC: Bradycardic, regular RESPIRATORY:  Clear to auscultation without rales, wheezing or rhonchi  ABDOMEN: Soft, non-distended EXTREMITIES:  No edema; No deformity   ASSESSMENT AND PLAN:    #. Syncope: Discussed role of loop recorder for monitoring in presence of syncope and recurrent near syncope. Given her history of PSVT and baseline sinus bradycardia, post conversion pauses and sinus node dysfunction could explain her symptoms.  - Explained risks, benefits, and alternatives to ILR implantation, including but not limited to bleeding, infection, damage to nearby structures.  Pt verbalized  understanding and agrees to proceed.   #. Sinus bradycardia: #. PSVT:  - We will monitor burden with loop recorder.  SURGEON:  Fonda Kitty, MD     PREPROCEDURE DIAGNOSIS:  Syncope    POSTPROCEDURE DIAGNOSIS: Syncope     PROCEDURES:   1. Implantable loop recorder implantation    INTRODUCTION:  Morgan Herrera presents with a history of syncope The costs of loop recorder monitoring have been  discussed with the patient.    DESCRIPTION OF PROCEDURE:  Informed written consent was obtained.   Time Out Completed with RN    The patient required no sedation for the procedure today.  Mapping over the patient's chest was performed to identify the area where electrograms were most prominent for ILR recording.  This area was found to be the left parasternal region over the 4th intercostal space. The patients left chest was therefore prepped and draped in the usual sterile fashion. The skin overlying the left parasternal region was infiltrated with lidocaine  for local analgesia.  A 0.5-cm incision was made over the left parasternal region over the 3rd intercostal space.  A subcutaneous ILR pocket was fashioned using a combination of sharp and blunt dissection.  A Medtronic Reveal LINQ 2 implantable loop recorder (serial # [id number]) was then placed into the pocket  R waves were very prominent and measuring 0.15mV.  Steri- Strips and a sterile dressing were then applied.  There were no early apparent complications.     CONCLUSIONS:   1. Successful implantation of a implantable loop recorder for a history of Syncope.  2. No early apparent complications.   Signed, Fonda Kitty, MD  "

## 2024-02-25 ENCOUNTER — Encounter: Payer: Self-pay | Admitting: Cardiology

## 2024-02-25 ENCOUNTER — Ambulatory Visit: Attending: Cardiology | Admitting: Cardiology

## 2024-02-25 VITALS — BP 100/66 | HR 47 | Ht 66.0 in | Wt 131.8 lb

## 2024-02-25 DIAGNOSIS — R001 Bradycardia, unspecified: Secondary | ICD-10-CM

## 2024-02-25 DIAGNOSIS — R55 Syncope and collapse: Secondary | ICD-10-CM

## 2024-02-25 DIAGNOSIS — I471 Supraventricular tachycardia, unspecified: Secondary | ICD-10-CM | POA: Diagnosis not present

## 2024-02-25 NOTE — Patient Instructions (Signed)
 Medication Instructions:  Your physician recommends that you continue on your current medications as directed. Please refer to the Current Medication list given to you today.  Labwork: None ordered.  Testing/Procedures: None ordered.  Follow-Up:  As needed with Dr. Jimmey Ralph  Implantable Loop Recorder Placement, Care After This sheet gives you information about how to care for yourself after your procedure. Your health care provider may also give you more specific instructions. If you have problems or questions, contact your health care provider. What can I expect after the procedure? After the procedure, it is common to have: Soreness or discomfort near the incision. Some swelling or bruising near the incision.  Follow these instructions at home: Incision care  Monitor your cardiac device site for redness, swelling, and drainage. Call the device clinic at [phone] if you experience these symptoms or fever/chills.  Keep the large square bandage on your site for 24 hours and then you may remove it yourself. Keep the steri-strips underneath in place.   You may shower after 72 hours / 3 days from your procedure with the steri-strips in place. They will usually fall off on their own, or may be removed after 10 days. Pat dry.   Avoid lotions, ointments, or perfumes over your incision until it is well-healed.  Please do not submerge in water until your site is completely healed.   Your device is MRI compatible.   Remote monitoring is used to monitor your cardiac device from home. This monitoring is scheduled every month by our office. It allows [place] to keep an eye on the function of your device to ensure it is working properly.  If your wound site starts to bleed apply pressure.    For help with the monitor please call Med[employer]onic Monitor Support Specialist directly at [phone].    If you have any questions/concerns please call the device clinic at  [phone].  Activity  Return to your normal activities.  General instructions Follow instructions from your health care provider about how to manage your implantable loop recorder and transmit the information. Learn how to activate a recording if this is necessary for your type of device. You may go through a metal detection gate, and you may let someone hold a metal detector over your chest. Show your ID card if needed. Do not have an MRI unless you check with your health care provider first. Take over-the-counter and prescription medicines only as told by your health care provider. Keep all follow-up visits as told by your health care provider. This is important. Contact a health care provider if: You have redness, swelling, or pain around your incision. You have a fever. You have pain that is not relieved by your pain medicine. You have triggered your device because of fainting (syncope) or because of a heartbeat that feels like it is racing, slow, fluttering, or skipping (palpitations). Get help right away if you have: Chest pain. Difficulty breathing. Summary After the procedure, it is common to have soreness or discomfort near the incision. Change your dressing as told by your health care provider. Follow instructions from your health care provider about how to manage your implantable loop recorder and transmit the information. Keep all follow-up visits as told by your health care provider. This is important. This information is not intended to replace advice given to you by your health care provider. Make sure you discuss any questions you have with your health care provider. Document Released: 08/29/2015 Document Revised: 11/02/2017 Document Reviewed: 11/02/2017 Elsevier Patient  Education  [employer].

## 2024-02-26 ENCOUNTER — Ambulatory Visit: Payer: Self-pay | Admitting: Primary Care

## 2024-02-26 ENCOUNTER — Encounter: Payer: Self-pay | Admitting: Primary Care

## 2024-02-26 ENCOUNTER — Ambulatory Visit (INDEPENDENT_AMBULATORY_CARE_PROVIDER_SITE_OTHER): Admitting: Primary Care

## 2024-02-26 VITALS — BP 126/80 | HR 45 | Temp 97.3°F | Ht 66.0 in | Wt 133.0 lb

## 2024-02-26 DIAGNOSIS — E559 Vitamin D deficiency, unspecified: Secondary | ICD-10-CM

## 2024-02-26 DIAGNOSIS — Z23 Encounter for immunization: Secondary | ICD-10-CM | POA: Diagnosis not present

## 2024-02-26 DIAGNOSIS — E785 Hyperlipidemia, unspecified: Secondary | ICD-10-CM | POA: Diagnosis not present

## 2024-02-26 DIAGNOSIS — G4733 Obstructive sleep apnea (adult) (pediatric): Secondary | ICD-10-CM | POA: Diagnosis not present

## 2024-02-26 DIAGNOSIS — Z0000 Encounter for general adult medical examination without abnormal findings: Secondary | ICD-10-CM

## 2024-02-26 DIAGNOSIS — R053 Chronic cough: Secondary | ICD-10-CM | POA: Insufficient documentation

## 2024-02-26 DIAGNOSIS — R5382 Chronic fatigue, unspecified: Secondary | ICD-10-CM | POA: Diagnosis not present

## 2024-02-26 DIAGNOSIS — I5032 Chronic diastolic (congestive) heart failure: Secondary | ICD-10-CM

## 2024-02-26 DIAGNOSIS — G35 Multiple sclerosis: Secondary | ICD-10-CM

## 2024-02-26 DIAGNOSIS — R7303 Prediabetes: Secondary | ICD-10-CM | POA: Diagnosis not present

## 2024-02-26 DIAGNOSIS — G4700 Insomnia, unspecified: Secondary | ICD-10-CM | POA: Diagnosis not present

## 2024-02-26 DIAGNOSIS — F3342 Major depressive disorder, recurrent, in full remission: Secondary | ICD-10-CM

## 2024-02-26 DIAGNOSIS — Z0001 Encounter for general adult medical examination with abnormal findings: Secondary | ICD-10-CM

## 2024-02-26 LAB — VITAMIN D 25 HYDROXY (VIT D DEFICIENCY, FRACTURES): VITD: 54.92 ng/mL (ref 30.00–100.00)

## 2024-02-26 LAB — VITAMIN B12: Vitamin B-12: 871 pg/mL (ref 211–911)

## 2024-02-26 LAB — LIPID PANEL
Cholesterol: 242 mg/dL — ABNORMAL HIGH (ref 0–200)
HDL: 78.9 mg/dL (ref >39.00)
LDL Cholesterol: 131 mg/dL — ABNORMAL HIGH (ref 0–99)
NonHDL: 162.73
Total CHOL/HDL Ratio: 3
Triglycerides: 161 mg/dL — ABNORMAL HIGH (ref 0.0–149.0)
VLDL: 32.2 mg/dL (ref 0.0–40.0)

## 2024-02-26 LAB — TSH: TSH: 1.9 u[IU]/mL (ref 0.35–5.50)

## 2024-02-26 LAB — BRAIN NATRIURETIC PEPTIDE: Pro B Natriuretic peptide (BNP): 178 pg/mL — ABNORMAL HIGH (ref 0.0–100.0)

## 2024-02-26 LAB — HEMOGLOBIN A1C: Hgb A1c MFr Bld: 5.9 % (ref 4.6–6.5)

## 2024-02-26 NOTE — Assessment & Plan Note (Signed)
 Controlled.  Continue trazodone  25 mg at bedtime.

## 2024-02-26 NOTE — Assessment & Plan Note (Signed)
 Repeat lipid panel pending. Continue off treatment. Proceed with cardiac catheterization as planned.

## 2024-02-26 NOTE — Assessment & Plan Note (Signed)
Continue vitamin D supplement. Repeat vitamin D level pending.

## 2024-02-26 NOTE — Assessment & Plan Note (Signed)
Controlled.  Continue Lexapro 10 mg daily. 

## 2024-02-26 NOTE — Assessment & Plan Note (Signed)
 Stable. Following with neurology, office notes reviewed from January 2025.  Continue off treatment.

## 2024-02-26 NOTE — Progress Notes (Signed)
 Subjective:    Patient ID: Morgan Herrera, female    DOB: [birth year], 73 y.o.   MRN: [id number]  HPI  Morgan Herrera is a very pleasant 73 y.o. female who presents today for complete physical and follow up of chronic conditions.  She would also like to discuss chronic cough. Chronic since February 2025, mostly dry with a scratchy/tickle sensation to her throat. She coughs throughout the day and night. She does notice post nasal drip, rhinorrhea, voice hoarseness. She's been taking Zyrtec consistently about 1 month ago. She denies esophageal burning. She denies fevers, chills, body aches.   Immunizations: -Tetanus: Completed in 2016  -Shingles: Completed Shingrix series -Pneumonia: Completed Prevnar 13 in 2018, Pneumovax in 2016  Diet: Fair diet.  Exercise: No regular exercise.  Eye exam: Completes annually  Dental exam: Completes semi-annually    Mammogram: Completed in May 2024, scheduled for July 2025 Bone Density Scan: Completed in October 2024  Colonoscopy: Completed in 2016, due 2026  BP Readings from Last 3 Encounters:  02/26/24 126/80  02/25/24 100/66  02/10/24 125/75    Wt Readings from Last 3 Encounters:  02/26/24 133 lb (60.3 kg)  02/25/24 131 lb 12.8 oz (59.8 kg)  02/10/24 132 lb 9.6 oz (60.1 kg)      Review of Systems  Constitutional:  Positive for fatigue. Negative for unexpected weight change.  HENT:  Positive for postnasal drip, rhinorrhea and voice change.   Respiratory:  Positive for cough and shortness of breath.   Cardiovascular:  Negative for chest pain.  Gastrointestinal:  Negative for constipation and diarrhea.  Genitourinary:  Negative for difficulty urinating.  Musculoskeletal:  Negative for arthralgias and myalgias.  Skin:  Negative for rash.  Allergic/Immunologic: Negative for environmental allergies.  Neurological:  Negative for dizziness, numbness and headaches.  Psychiatric/Behavioral:  Negative for sleep disturbance. The  patient is not nervous/anxious.          Past Medical History:  Diagnosis Date   (HFpEF) heart failure with preserved ejection fraction (HCC)    a. 03/2018 Echo: EF 55-60%, no rwma. Mod MR. Nl PASP; b. 03/2019 Echo: EF 60-65%, ? DD, mod MR.   Acute CHF (congestive heart failure) (HCC) 04/23/2018   Chest pain    a. 03/2018 Ex MV: rest only imaging showed ant wall perfusion defect. No stress imaging as pt developed hypotension w/ exercise; b. 05/2018 Cath: Nl cors w/ sluggish flow in the LAD suggestive of endothelial dysfxn. EF 55-65%.   Full body hives 09/12/2020   Loud snoring 02/02/2022   Migraine    Multiple sclerosis (HCC)    Neuromuscular disorder (HCC)    PMR (polymyalgia rheumatica) (HCC)    PND (post-nasal drip) 04/16/2022   Pulmonary nodule    a. 03/2018 CTA Chest: 9mm small spiculated nodular density @ the L lung apex. Rec f/u in 3 mos.   Puncture wound 03/04/2020   Relapsing remitting multiple sclerosis (HCC) 12/13/2015   Shortness of breath 04/30/2018    Social History   Socioeconomic History   Marital status: Married    Spouse name: Not on file   Number of children: 1   Years of education: Not on file   Highest education level: Bachelor's degree (e.g., BA, AB, BS)  Occupational History   Not on file  Tobacco Use   Smoking status: Former    Current packs/day: 0.00    Average packs/day: 1 pack/day for 45.0 years (45.0 ttl pk-yrs)    Types: Cigarettes  Start date: 10    Quit date: 2000    Years since quitting: 25.4   Smokeless tobacco: Never  Vaping Use   Vaping status: Never Used  Substance and Sexual Activity   Alcohol use: Yes    Alcohol/week: 0.0 standard drinks of alcohol    Comment: 1 to 2 glass of wine at least 4 nights a week   Drug use: No   Sexual activity: Not on file  Other Topics Concern   Not on file  Social History Narrative   Married.   Moved here from [place] after [facility] years   Enjoys arts and crafts. Taking a painting class.    Walking with her spouse.    Reading, shopping.   Right handed   Social Drivers of Health   Financial Resource Strain: Low Risk  (02/24/2024)   Overall Financial Resource Strain (CARDIA)    Difficulty of Paying Living Expenses: Not hard at all  Food Insecurity: No Food Insecurity (02/24/2024)   Hunger Vital Sign    Worried About Running Out of Food in the Last Year: Never true    Ran Out of Food in the Last Year: Never true  Transportation Needs: No Transportation Needs (02/24/2024)   PRAPARE - Administrator, Civil Service (Medical): No    Lack of Transportation (Non-Medical): No  Physical Activity: Sufficiently Active (02/24/2024)   Exercise Vital Sign    Days of Exercise per Week: 5 days    Minutes of Exercise per Session: 60 min  Stress: No Stress Concern Present (02/24/2024)   [employer] of Occupational Health - Occupational Stress Questionnaire    Feeling of Stress : Not at all  Recent Concern: Stress - Stress Concern Present (11/28/2023)   [employer] of Occupational Health - Occupational Stress Questionnaire    Feeling of Stress : To some extent  Social Connections: Moderately Isolated (02/24/2024)   Social Connection and Isolation Panel [NHANES]    Frequency of Communication with Friends and Family: More than three times a week    Frequency of Social Gatherings with Friends and Family: More than three times a week    Attends Religious Services: Never    Database administrator or Organizations: No    Attends Banker Meetings: Never    Marital Status: Married  Catering manager Violence: Not At Risk (11/22/2022)   Humiliation, Afraid, Rape, and Kick questionnaire    Fear of Current or Ex-Partner: No    Emotionally Abused: No    Physically Abused: No    Sexually Abused: No    Past Surgical History:  Procedure Laterality Date   BREAST EXCISIONAL BIOPSY     BREAST SURGERY  1990   COLONOSCOPY WITH PROPOFOL  N/A 05/19/2015   Procedure:  COLONOSCOPY WITH PROPOFOL ;  Surgeon: Stephens Eis, MD;  Location: [facility] ENDOSCOPY;  Service: Gastroenterology;  Laterality: N/A;   EYE SURGERY     1978 1st of 3 eye surgeries   FOOT SURGERY Left 10/30/2022   LEFT HEART CATH AND CORONARY ANGIOGRAPHY N/A 04/25/2018   Procedure: LEFT HEART CATH AND CORONARY ANGIOGRAPHY;  Surgeon: Wenona Hamilton, MD;  Location: [facility] INVASIVE CV LAB;  Service: Cardiovascular;  Laterality: N/A;   TENDON REPAIR      Family History  Problem Relation Age of Onset   Heart disease Mother    Hypertension Mother    Parkinson's disease Mother    Parkinsonism Mother    Cancer Maternal Grandmother  breast   Breast cancer Maternal Grandmother    Asthma Father     No Known Allergies  Current Outpatient Medications on File Prior to Visit  Medication Sig Dispense Refill   calcium carbonate (OS-CAL - DOSED IN MG OF ELEMENTAL CALCIUM) 1250 (500 Ca) MG tablet Take 1 tablet by mouth daily.     cholecalciferol (VITAMIN D ) 1000 units tablet Take 2,000 Units by mouth daily.     cyanocobalamin  500 MCG tablet Take 500 mcg by mouth daily.     escitalopram  (LEXAPRO ) 10 MG tablet TAKE 1 TABLET BY MOUTH DAILY FOR DEPRESSION 90 tablet 0   furosemide  (LASIX ) 20 MG tablet TAKE 1 TABLET BY MOUTH EVERY DAY 90 tablet 0   Magnesium 250 MG TABS Take by mouth.     Omega-3 Fatty Acids (FISH OIL PEARLS PO) Take 1,400 mg by mouth daily.     traZODone  (DESYREL ) 50 MG tablet TAKE 1 TABLET BY MOUTH AT BEDTIME FOR SLEEP 90 tablet 0   gabapentin  (NEURONTIN ) 100 MG capsule TAKE 1 CAPSULE BY MOUTH THREE TIMES A DAY (Patient not taking: Reported on 02/26/2024) 270 capsule 3   meclizine  (ANTIVERT ) 25 MG tablet Take 1 tablet (25 mg total) by mouth 3 (three) times daily as needed. (Patient not taking: Reported on 02/26/2024) 30 tablet 0   No current facility-administered medications on file prior to visit.    BP 126/80   Pulse (!) 45   Temp (!) 97.3 F (36.3 C) (Temporal)   Ht 5\' 6"  (1.676 m)    Wt 133 lb (60.3 kg)   SpO2 98%   BMI 21.47 kg/m  Objective:   Physical Exam HENT:     Right Ear: Tympanic membrane and ear canal normal.     Left Ear: Tympanic membrane and ear canal normal.  Eyes:     Pupils: Pupils are equal, round, and reactive to light.  Cardiovascular:     Rate and Rhythm: Normal rate and regular rhythm.  Pulmonary:     Effort: Pulmonary effort is normal.     Breath sounds: Normal breath sounds.  Abdominal:     General: Bowel sounds are normal.     Palpations: Abdomen is soft.     Tenderness: There is no abdominal tenderness.  Musculoskeletal:        General: Normal range of motion.     Cervical back: Neck supple.  Skin:    General: Skin is warm and dry.  Neurological:     Mental Status: She is alert and oriented to person, place, and time.     Cranial Nerves: No cranial nerve deficit.     Deep Tendon Reflexes:     Reflex Scores:      Patellar reflexes are 2+ on the right side and 2+ on the left side. Psychiatric:        Mood and Affect: Mood normal.           Assessment & Plan:  Encounter for annual general medical examination with abnormal findings in adult Assessment & Plan: Prevnar 20 provided today. Mammogram scheduled for July 2025. Colonoscopy UTD, due 2026  Discussed the importance of a healthy diet and regular exercise in order for weight loss, and to reduce the risk of further co-morbidity.  Exam stable. Labs pending.  Follow up in 1 year for repeat physical.    Moderate obstructive sleep apnea Assessment & Plan: Controlled.  Continue specialized pillow.    Chronic diastolic heart failure [facility]) Assessment & Plan: Following  with cardiology, office notes reviewed from May 2025.  Appears euvolemic today. Continue furosemide  20 mg daily.    Orders: -     Brain natriuretic peptide  Multiple sclerosis (HCC) Assessment & Plan: Stable. Following with neurology, office notes reviewed from January 2025.  Continue  off treatment.     Chronic fatigue Assessment & Plan: Ongoing.  Proceed with cardiac catheterization and loop recorder as planned. Checking vitamin D  and B12, TSH.  Orders: -     Vitamin B12 -     TSH  Recurrent major depressive disorder, in full remission (HCC) Assessment & Plan: Controlled.  Continue Lexapro  10 mg daily.   Hyperlipidemia, unspecified hyperlipidemia type Assessment & Plan: Repeat lipid panel pending. Continue off treatment. Proceed with cardiac catheterization as planned.  Orders: -     Lipid panel  Insomnia, unspecified type Assessment & Plan: Controlled.  Continue trazodone  25 mg at bedtime.   Vitamin D  deficiency Assessment & Plan: Continue vitamin D  supplement.  Repeat vitamin D  level pending.  Orders: -     VITAMIN D  25 Hydroxy (Vit-D Deficiency, Fractures)  Persistent cough for 3 weeks or longer Assessment & Plan: Differentials include postnasal drip, silent reflux, CHF. Respiratory exam today reassuring. Not managed on ACE or ARB.  Checking labs today including BNP.  Stop Zyrtec 10 mg.  Start Xyzal 5 mg daily. Start famotidine 20-40 mg HS.   She will update in a few weeks.  Orders: -     Brain natriuretic peptide  Prediabetes Assessment & Plan: Repeat A1C pending. Increase physical activity as tolerated.  Orders: -     Hemoglobin A1c        Gabriel John, NP

## 2024-02-26 NOTE — Assessment & Plan Note (Addendum)
 Differentials include postnasal drip, silent reflux, CHF. Respiratory exam today reassuring. Not managed on ACE or ARB.  Checking labs today including BNP.  Stop Zyrtec 10 mg.  Start Xyzal 5 mg daily. Start famotidine 20-40 mg HS.   She will update in a few weeks.

## 2024-02-26 NOTE — Assessment & Plan Note (Signed)
 Prevnar 20 provided today. Mammogram scheduled for July 2025. Colonoscopy UTD, due 2026  Discussed the importance of a healthy diet and regular exercise in order for weight loss, and to reduce the risk of further co-morbidity.  Exam stable. Labs pending.  Follow up in 1 year for repeat physical.

## 2024-02-26 NOTE — Assessment & Plan Note (Signed)
 Repeat A1C pending. Increase physical activity as tolerated.

## 2024-02-26 NOTE — Patient Instructions (Addendum)
 Stop by the lab prior to leaving today. I will notify you of your results once received.   Stop taking Zyrtec for allergies.  Start levocetirizine (Xyzal) 5 mg once daily for allergies.  Start famotidine (Pepcid) 20 mg at bedtime for cough.  Take 1 or 2 tablets by mouth at bedtime.  It was a pleasure to see you today!

## 2024-02-26 NOTE — Assessment & Plan Note (Addendum)
 Ongoing.  Proceed with cardiac catheterization and loop recorder as planned. Checking vitamin D  and B12, TSH.

## 2024-02-26 NOTE — Assessment & Plan Note (Signed)
 Controlled.  Continue specialized pillow.

## 2024-02-26 NOTE — Assessment & Plan Note (Signed)
 Following with cardiology, office notes reviewed from May 2025.  Appears euvolemic today. Continue furosemide  20 mg daily.

## 2024-02-27 ENCOUNTER — Telehealth: Payer: Self-pay | Admitting: Cardiology

## 2024-02-27 NOTE — Telephone Encounter (Signed)
 Called patient, advised that she had a loop implant, it has dried blood up under the steri strips, she is unsure if she should leave it be until they come off, or if we should redress it.   She states there is not new bleeding, no redness, still is sore.   Will route to NP to review and get opinion on what we should do with the wound.

## 2024-02-27 NOTE — Telephone Encounter (Signed)
Called patient, advised of message below. Patient verbalized understanding.  

## 2024-02-27 NOTE — Telephone Encounter (Signed)
 Pt had a loop implanted on Tuesday and she just took the outer bandage off today and the steri strips and dried with blood. What does she do, does she need to just leave it as is or come in have redressed with clean steri-strips. Everything else looks normal

## 2024-02-28 ENCOUNTER — Telehealth: Payer: Self-pay

## 2024-02-28 DIAGNOSIS — I441 Atrioventricular block, second degree: Secondary | ICD-10-CM

## 2024-02-28 NOTE — Telephone Encounter (Signed)
 Alert remote transmission:  Symptom Event occurred 5/29 @ 09:24, EGM appear SR/ST, intermittent oversensing Presenting SB Route to triage for first symptom Follow up as scheduled. LA, CVRS  Pt was walking and got SOB, slightly dizzy, and unbalanced. Stopped for 30-40 seconds bent down with head near knees until it passed and continued walking.

## 2024-02-28 NOTE — Addendum Note (Signed)
 Addended by: CHAUVIGNE, Diamante Rubin on: 02/28/2024 12:03 PM   Modules accepted: Orders

## 2024-02-28 NOTE — Telephone Encounter (Signed)
 Called and made patient aware and she is agreeable to treadmill in [place]. Routing to [employer].

## 2024-02-28 NOTE — Telephone Encounter (Signed)
 MD would like treadmill ordered in [place] to evaluate for [place] HB.

## 2024-02-28 NOTE — Telephone Encounter (Signed)
Will review w/ MD.

## 2024-02-28 NOTE — Telephone Encounter (Signed)
ETT has been ordered.

## 2024-03-02 ENCOUNTER — Ambulatory Visit: Payer: PPO

## 2024-03-03 ENCOUNTER — Encounter (HOSPITAL_COMMUNITY): Payer: Self-pay

## 2024-03-04 NOTE — Addendum Note (Signed)
 Addended by: Ardeen Kohler D on: 03/04/2024 08:13 AM   Modules accepted: Orders

## 2024-03-05 ENCOUNTER — Ambulatory Visit
Admission: RE | Admit: 2024-03-05 | Discharge: 2024-03-05 | Disposition: A | Discharge status: Home / Self Care | Source: Ambulatory Visit | Attending: Physician Assistant | Admitting: Physician Assistant

## 2024-03-05 DIAGNOSIS — R072 Precordial pain: Secondary | ICD-10-CM | POA: Diagnosis not present

## 2024-03-05 MED ORDER — IOHEXOL 350 MG/ML SOLN
80.0000 mL | Freq: Once | INTRAVENOUS | Status: AC | PRN
  Administered 2024-03-05: 80 mL via INTRAVENOUS

## 2024-03-05 MED ORDER — NITROGLYCERIN 0.4 MG SL SUBL
SUBLINGUAL_TABLET | SUBLINGUAL | Status: AC
  Filled 2024-03-05: qty 2

## 2024-03-05 MED ORDER — NITROGLYCERIN 0.4 MG SL SUBL
0.8000 mg | SUBLINGUAL_TABLET | Freq: Once | SUBLINGUAL | Status: AC
  Administered 2024-03-05: 0.8 mg via SUBLINGUAL
  Filled 2024-03-05: qty 25

## 2024-03-05 NOTE — Progress Notes (Signed)
 Patient tolerated CT well. Vital signs stable encourage to drink water throughout day.Reasons explained and verbalized understanding. Ambulated steady gait.

## 2024-03-08 ENCOUNTER — Other Ambulatory Visit: Payer: Self-pay | Admitting: Primary Care

## 2024-03-08 DIAGNOSIS — F330 Major depressive disorder, recurrent, mild: Secondary | ICD-10-CM

## 2024-03-08 DIAGNOSIS — R609 Edema, unspecified: Secondary | ICD-10-CM

## 2024-03-16 ENCOUNTER — Encounter (HOSPITAL_COMMUNITY): Payer: Self-pay | Admitting: *Deleted

## 2024-03-16 ENCOUNTER — Telehealth (HOSPITAL_COMMUNITY): Payer: Self-pay | Admitting: *Deleted

## 2024-03-16 NOTE — Telephone Encounter (Signed)
 Instructions for upcoming stress test sent via [employer].  Argentina Bees, RN

## 2024-03-19 ENCOUNTER — Telehealth: Payer: Self-pay

## 2024-03-19 NOTE — Telephone Encounter (Signed)
 Alert remote transmission: Symptom 1 symptom activation 6/18 @ 09:03, EGM c/w SB, HR in the 30's  __________________________________________________________  Morgan Herrera with patient regarding symptom activation resulting in transmission being sent. Patient states she recently tested positive for COVID with symptoms consisting of coughing and sneezing. Patient was unsure if her symptoms would affect the readings on her device and wanted to inform the device clinic as a precautionary measure. Hx of bradycardia noted and patient informed to call device clinic with any symptoms that may be associated. Patient verbalizes understanding and is appreciative for call.

## 2024-03-26 ENCOUNTER — Ambulatory Visit (HOSPITAL_COMMUNITY)

## 2024-03-27 ENCOUNTER — Ambulatory Visit (INDEPENDENT_AMBULATORY_CARE_PROVIDER_SITE_OTHER)

## 2024-03-27 DIAGNOSIS — R55 Syncope and collapse: Secondary | ICD-10-CM

## 2024-03-30 LAB — CUP PACEART REMOTE DEVICE CHECK
Date Time Interrogation Session: 20250627102503
Implantable Pulse Generator Implant Date: 20250527

## 2024-04-01 ENCOUNTER — Telehealth (HOSPITAL_COMMUNITY): Payer: Self-pay | Admitting: *Deleted

## 2024-04-01 ENCOUNTER — Encounter (HOSPITAL_COMMUNITY): Payer: Self-pay | Admitting: *Deleted

## 2024-04-01 NOTE — Telephone Encounter (Signed)
 Reminder letter with instructions for upcoming GXT on 04/10/24 at 1:45 sent via [employer].

## 2024-04-02 ENCOUNTER — Other Ambulatory Visit: Payer: Self-pay | Admitting: Medical Genetics

## 2024-04-04 ENCOUNTER — Ambulatory Visit: Payer: Self-pay | Admitting: Cardiology

## 2024-04-09 ENCOUNTER — Ambulatory Visit
Admission: RE | Admit: 2024-04-09 | Discharge: 2024-04-09 | Disposition: A | Discharge status: Home / Self Care | Source: Ambulatory Visit | Attending: Primary Care | Admitting: Primary Care

## 2024-04-09 DIAGNOSIS — Z1231 Encounter for screening mammogram for malignant neoplasm of breast: Secondary | ICD-10-CM | POA: Diagnosis not present

## 2024-04-10 ENCOUNTER — Ambulatory Visit (HOSPITAL_COMMUNITY)
Admission: RE | Admit: 2024-04-10 | Discharge: 2024-04-10 | Disposition: A | Discharge status: Home / Self Care | Source: Ambulatory Visit | Attending: Internal Medicine | Admitting: Internal Medicine

## 2024-04-10 DIAGNOSIS — I441 Atrioventricular block, second degree: Secondary | ICD-10-CM | POA: Diagnosis not present

## 2024-04-12 LAB — EXERCISE TOLERANCE TEST
Angina Index: 0
Duke Treadmill Score: 5
Estimated workload: 6.8
Exercise duration (min): 4 min
Exercise duration (sec): 52 s
MPHR: 148 {beats}/min
Peak HR: 109 {beats}/min
Percent HR: 73 %
Rest HR: 58 {beats}/min
ST Depression (mm): 0 mm

## 2024-04-13 DIAGNOSIS — Z961 Presence of intraocular lens: Secondary | ICD-10-CM | POA: Diagnosis not present

## 2024-04-13 DIAGNOSIS — H2512 Age-related nuclear cataract, left eye: Secondary | ICD-10-CM | POA: Diagnosis not present

## 2024-04-13 DIAGNOSIS — H59811 Chorioretinal scars after surgery for detachment, right eye: Secondary | ICD-10-CM | POA: Diagnosis not present

## 2024-04-13 DIAGNOSIS — H3540 Unspecified peripheral retinal degeneration: Secondary | ICD-10-CM | POA: Diagnosis not present

## 2024-04-14 ENCOUNTER — Ambulatory Visit: Payer: Self-pay | Admitting: Primary Care

## 2024-04-19 ENCOUNTER — Ambulatory Visit: Payer: Self-pay | Admitting: Cardiology

## 2024-04-20 ENCOUNTER — Telehealth: Payer: Self-pay | Admitting: Cardiology

## 2024-04-20 NOTE — Telephone Encounter (Signed)
 Pt calling for for ETT results

## 2024-04-27 ENCOUNTER — Ambulatory Visit

## 2024-04-27 DIAGNOSIS — R55 Syncope and collapse: Secondary | ICD-10-CM

## 2024-04-28 LAB — CUP PACEART REMOTE DEVICE CHECK
Date Time Interrogation Session: 20250728102419
Implantable Pulse Generator Implant Date: 20250527

## 2024-05-10 ENCOUNTER — Ambulatory Visit: Payer: Self-pay | Admitting: Cardiology

## 2024-05-13 ENCOUNTER — Ambulatory Visit: Attending: Physician Assistant | Admitting: Physician Assistant

## 2024-05-13 ENCOUNTER — Encounter: Payer: Self-pay | Admitting: Physician Assistant

## 2024-05-13 VITALS — BP 108/76 | HR 40 | Ht 66.0 in | Wt 134.6 lb

## 2024-05-13 DIAGNOSIS — I471 Supraventricular tachycardia, unspecified: Secondary | ICD-10-CM

## 2024-05-13 DIAGNOSIS — I2510 Atherosclerotic heart disease of native coronary artery without angina pectoris: Secondary | ICD-10-CM | POA: Diagnosis not present

## 2024-05-13 DIAGNOSIS — I5032 Chronic diastolic (congestive) heart failure: Secondary | ICD-10-CM

## 2024-05-13 DIAGNOSIS — R0600 Dyspnea, unspecified: Secondary | ICD-10-CM | POA: Diagnosis not present

## 2024-05-13 DIAGNOSIS — R001 Bradycardia, unspecified: Secondary | ICD-10-CM | POA: Diagnosis not present

## 2024-05-13 DIAGNOSIS — Z87898 Personal history of other specified conditions: Secondary | ICD-10-CM

## 2024-05-13 NOTE — Patient Instructions (Signed)
 Medication Instructions:  Your physician recommends that you continue on your current medications as directed. Please refer to the Current Medication list given to you today.   *If you need a refill on your cardiac medications before your next appointment, please call your pharmacy*  Lab Work: None ordered at this time   Follow-Up: At [facility], you and your health needs are our priority.  As part of our continuing mission to provide you with exceptional heart care, our providers are all part of one team.  This team includes your primary Cardiologist (physician) and Advanced Practice Providers or APPs (Physician Assistants and Nurse Practitioners) who all work together to provide you with the care you need, when you need it.  Your next appointment:   3 month(s)  Provider:   You may see Deatrice Cage, MD or Bernardino Bring, PA-C

## 2024-05-13 NOTE — Progress Notes (Signed)
 Cardiology Office Note    Date:  05/13/2024   ID:  Morgan Herrera, DOB [birth year], MRN [id number]  PCP:  Gretta Comer POUR, NP  Cardiologist:  Deatrice Cage, MD  Electrophysiologist:  Fonda Kitty, MD   Chief Complaint: Follow-up  History of Present Illness:   Morgan Herrera is a 73 y.o. 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  She was admitted in 03/2018 with fatigue, shortness of breath, and chest pain.  D-dimer was elevated.  CTA of the chest showed no evidence of PE.  BNP was mildly elevated.  Echo demonstrated a preserved LV systolic function.  Stress test was aborted due to hypertensive response with exercise.  LHC showed normal coronary arteries with sluggish flow in the LAD suggestive of endothelial dysfunction.  EF was normal.  LVEDP was mildly elevated.  She did not tolerate long-acting nitroglycerin  due to headache.  PFTs in 07/2018 showed no evidence of obstructive or restrictive lung disease.  Repeat echo in 03/2019 showed normal LV systolic function and diastolic function with mild to moderate mitral regurgitation.  She underwent Zio patch in 08/2020, due to intermittent palpitations, which showed a normal sinus rhythm with an average heart rate of 58 bpm with a low heart rate of 39 bpm in the morning, 166 episodes of SVT with the longest episode lasting 3 minutes and 15 seconds with an average rate of 115 bpm and correlated with symptoms.  She was last seen in the office in 08/2022 noting stable chronic fatigue and dyspnea with chronic back pain and posterior shoulder/neck discomfort that had been present for several years.  Regarding her longstanding sinus bradycardia, she demonstrated appropriate chronotropic competence in the office and labs were stable.   There was no indication for PPM.     She was seen in the ED on 02/09/2024 after standing up to go to the restroom overnight, got lightheaded, and passed out, hitting her head.  EKG showed sinus bradycardia with a first-degree AV block with a heart rate of 53 bpm.  High-sensitivity troponin negative x 2.  CT head and cervical spine without acute processes.  She followed up in the office on 02/10/2024 reporting a 6-month history of increased shortness of breath with associated dizziness and presyncope.  Echo in 01/2024 showed an EF of 55 to 60%, no regional wall motion abnormalities, mild LVH, normal LV diastolic function parameters, normal RV systolic function and ventricular cavity size, mild mitral regurgitation, mild aortic insufficiency, and an estimated right atrial pressure of 3 mmHg.  Coronary CTA in 03/2024 showed a calcium score of 0 with no evidence of CAD.  She was evaluated by EP and underwent a loop recorder implantation in 03/2024 with no significant arrhythmias or evidence of prolonged pauses/high-grade AV block noted on monitoring.  ETT in 03/2024 showed decreased exercise capacity that was clinically negative and electrically nondiagnostic for ischemia with patient achieving 78% of max predicted heart rate.  She was able to increase heart rate to 98 bpm with one-to-one conduction with the EP recommending no further intervention if she remained asymptomatic.  Patient notes during stress test she became quite short of breath and unsteady with exertion.  She comes in accompanied by her husband today and is without symptoms of angina or cardiac decompensation.  She continues to note exertional shortness of breath,  at times with minimal activity such as ambulating from room to room in her house.  At baseline, she does remain active and is not sedentary.  She does walk regularly with a friend, though this is at a slow pace and secondary to her friend's health, typically a 23-minute mile pace.  No  significant lower extremity swelling or progressive orthopnea.  No further near syncope or frank syncope.   Labs independently reviewed: 01/2024 - TSH normal, A1c 5.9, TC 242, TG 161, HDL 78, LDL 131, BNP 178, BUN 22, serum creatinine 0.78, potassium 4.7, Hgb 12.8, PLT 238, albumin 4.1, AST/ALT normal  Past Medical History:  Diagnosis Date   (HFpEF) heart failure with preserved ejection fraction (HCC)    a. 03/2018 Echo: EF 55-60%, no rwma. Mod MR. Nl PASP; b. 03/2019 Echo: EF 60-65%, ? DD, mod MR.   Acute CHF (congestive heart failure) (HCC) 04/23/2018   Chest pain    a. 03/2018 Ex MV: rest only imaging showed ant wall perfusion defect. No stress imaging as pt developed hypotension w/ exercise; b. 05/2018 Cath: Nl cors w/ sluggish flow in the LAD suggestive of endothelial dysfxn. EF 55-65%.   Full body hives 09/12/2020   Loud snoring 02/02/2022   Migraine    Multiple sclerosis (HCC)    Neuromuscular disorder (HCC)    PMR (polymyalgia rheumatica) (HCC)    PND (post-nasal drip) 04/16/2022   Pulmonary nodule    a. 03/2018 CTA Chest: 9mm small spiculated nodular density @ the L lung apex. Rec f/u in 3 mos.   Puncture wound 03/04/2020   Relapsing remitting multiple sclerosis (HCC) 12/13/2015   Shortness of breath 04/30/2018    Past Surgical History:  Procedure Laterality Date   BREAST EXCISIONAL BIOPSY     BREAST SURGERY  1990   COLONOSCOPY WITH PROPOFOL  N/A 05/19/2015   Procedure: COLONOSCOPY WITH PROPOFOL ;  Surgeon: Deward CINDERELLA Piedmont, MD;  Location: [facility] ENDOSCOPY;  Service: Gastroenterology;  Laterality: N/A;   EYE SURGERY     1978 1st of 3 eye surgeries   FOOT SURGERY Left 10/30/2022   LEFT HEART CATH AND CORONARY ANGIOGRAPHY N/A 04/25/2018   Procedure: LEFT HEART CATH AND CORONARY ANGIOGRAPHY;  Surgeon: Darron Deatrice LABOR, MD;  Location: [facility] INVASIVE CV LAB;  Service: Cardiovascular;  Laterality: N/A;   TENDON REPAIR      Current Medications: Current Meds  Medication Sig   calcium  carbonate (OS-CAL - DOSED IN MG OF ELEMENTAL CALCIUM) 1250 (500 Ca) MG tablet Take 1 tablet by mouth daily.   cholecalciferol (VITAMIN D ) 1000 units tablet Take 2,000 Units by mouth daily.   cyanocobalamin  500 MCG tablet Take 500 mcg by mouth daily.   escitalopram  (LEXAPRO ) 10 MG tablet TAKE 1 TABLET BY MOUTH DAILY FOR DEPRESSION   furosemide  (LASIX ) 20 MG tablet TAKE 1 TABLET BY MOUTH EVERY DAY   Omega-3 Fatty Acids (FISH OIL PEARLS PO) Take 1,400 mg by mouth daily.   traZODone  (DESYREL ) 50 MG tablet TAKE 1 TABLET BY MOUTH AT BEDTIME FOR SLEEP    Allergies:   Patient has no known allergies.   Social History   Socioeconomic History   Marital status: Married    Spouse name: Not on file   Number of children: 1   Years of education: Not on file   Highest education level: Bachelor's degree (e.g., BA, AB, BS)  Occupational History   Not on file  Tobacco Use   Smoking status: Former    Current packs/day: 0.00  Average packs/day: 1 pack/day for 45.0 years (45.0 ttl pk-yrs)    Types: Cigarettes    Start date: 70    Quit date: 2000    Years since quitting: 25.6   Smokeless tobacco: Never  Vaping Use   Vaping status: Never Used  Substance and Sexual Activity   Alcohol use: Yes    Alcohol/week: 0.0 standard drinks of alcohol    Comment: 1 to 2 glass of wine at least 4 nights a week   Drug use: No   Sexual activity: Not on file  Other Topics Concern   Not on file  Social History Narrative   Married.   Moved here from [place]. [facility] after 35 years   Enjoys arts and crafts. Taking a painting class.   Walking with her spouse.    Reading, shopping.   Right handed   Social Drivers of Health   Financial Resource Strain: Low Risk  (02/24/2024)   Overall Financial Resource Strain (CARDIA)    Difficulty of Paying Living Expenses: Not hard at all  Food Insecurity: No Food Insecurity (02/24/2024)   Hunger Vital Sign    Worried About Running Out of Food in the Last Year: Never true     Ran Out of Food in the Last Year: Never true  Transportation Needs: No Transportation Needs (02/24/2024)   PRAPARE - Administrator, Civil Service (Medical): No    Lack of Transportation (Non-Medical): No  Physical Activity: Sufficiently Active (02/24/2024)   Exercise Vital Sign    Days of Exercise per Week: 5 days    Minutes of Exercise per Session: 60 min  Stress: No Stress Concern Present (02/24/2024)   [employer] of Occupational Health - Occupational Stress Questionnaire    Feeling of Stress : Not at all  Recent Concern: Stress - Stress Concern Present (11/28/2023)   [employer] of Occupational Health - Occupational Stress Questionnaire    Feeling of Stress : To some extent  Social Connections: Moderately Isolated (02/24/2024)   Social Connection and Isolation Panel    Frequency of Communication with Friends and Family: More than three times a week    Frequency of Social Gatherings with Friends and Family: More than three times a week    Attends Religious Services: Never    Database administrator or Organizations: No    Attends Engineer, structural: Never    Marital Status: Married     Family History:  The patient's family history includes Asthma in her father; Breast cancer in her maternal grandmother; Cancer in her maternal grandmother; Heart disease in her mother; Hypertension in her mother; Parkinson's disease in her mother; Parkinsonism in her mother.  ROS:   12-point review of systems is negative unless otherwise noted in the HPI.   EKGs/Labs/Other Studies Reviewed:    Studies reviewed were summarized above. The additional studies were reviewed today:  ETT 04/10/2024:   Exercise stress test:  Clinically negative, electrically nondiagnostic for ischemia   Pt only achieved 73% maxi predicted HR( 109 bpm) and PRP of only 17,113.    No EKG changes for HR achieved.   Decreased exercise capacity __________  Coronary CTA  03/05/2024: FINDINGS: Aorta:  Normal size.  No calcifications.  No dissection.   Aortic Valve:  Trileaflet.  No calcifications.   Coronary Arteries:  Normal coronary origin.  Right dominance.   RCA is a dominant artery. There is no plaque.   Left main gives rise to LAD and LCX arteries.  LM has no disease.   LAD has no plaque.   LCX is a non-dominant artery.  There is no plaque.   Other findings:   Normal pulmonary vein drainage into the left atrium.   Normal left atrial appendage without a thrombus.   Normal size of the pulmonary artery.   IMPRESSION: 1. Coronary calcium score of 0.   2. Normal coronary origin with right dominance.   3. No evidence of CAD.   4. CAD-RADS 0. Consider non-atherosclerotic causes of chest pain. __________  2D echo 02/17/2024: 1. Left ventricular ejection fraction, by estimation, is 55 to 60%. The  left ventricle has normal function. The left ventricle has no regional  wall motion abnormalities. There is mild left ventricular hypertrophy.  Left ventricular diastolic parameters  were normal. The average left ventricular global longitudinal strain is  -21.3 %. The global longitudinal strain is normal.   2. Right ventricular systolic function is normal. The right ventricular  size is normal.   3. The mitral valve is normal in structure. Mild mitral valve  regurgitation.   4. The aortic valve is tricuspid. Aortic valve regurgitation is mild.   5. The inferior vena cava is normal in size with greater than 50%  respiratory variability, suggesting right atrial pressure of 3 mmHg.  __________  Zio patch 08/2020: Normal sinus rhythm with an average heart rate 58 bpm.  Lowest heart rate was 39 bpm at 7:18 in the morning. 166 episodes of SVT noted.  The longest lasted 3 minutes and 15 seconds with an average rate of 115 bpm.  Most of these episodes were associated with symptoms of shortness of breath and palpitations. __________   2D echo  03/18/2019: 1. The left ventricle has normal systolic function with an ejection  fraction of 60-65%. The cavity size was normal. Left ventricular diastolic  Doppler parameters are consistent with pseudonormalization.   2. The right ventricle has normal systolic function. The cavity was  normal. There is no increase in right ventricular wall thickness.Unable to  estimate RVSP.   3. Mitral valve regurgitation is moderate. Unable to exclude mild valve  prolapse.  __________   [facility] 04/25/2018: The left ventricular systolic function is normal. LV end diastolic pressure is mildly elevated. The left ventricular ejection fraction is 55-65% by visual estimate.   1.  Normal coronary arteries.  Sluggish flow in the LAD suggestive of endothelial dysfunction. 2.  Normal LV systolic function.  Mildly elevated left ventricular end-diastolic pressure at 18 mmHg.  No significant mitral regurgitation   Recommendations: No significant obstructive coronary artery disease to explain the patient's symptoms.  She likely has mild degree of diastolic heart failure.  Recommend continuing small dose furosemide . Small dose long-acting nitroglycerin  can be considered for suspected endothelial dysfunction.  __________   [facility] 04/24/2018: Rest only images with anterior wall perfusion defect. The  patient was supposed to have a treadmill nuclear stress test but had hypotensive response with exercise and thus was not injected with stress dose.  The test was canceled and cardiac catheterization was recommended. __________   2D echo 04/24/2018: - Left ventricle: The cavity size was normal. Wall thickness was    normal. Systolic function was normal. The estimated ejection    fraction was in the range of 55% to 60%. Wall motion was normal;    there were no regional wall motion abnormalities.  - Mitral valve: There was moderate regurgitation.  - Pulmonary arteries: Systolic pressure was within the normal  range.     EKG:  EKG is ordered today.  The EKG ordered today demonstrates marked sinus bradycardia, 40 bpm, first-degree AV block, no acute ST-T changes  Recent Labs: 02/09/2024: ALT 22; Hemoglobin 12.8; Platelets 238 02/10/2024: BUN 22; Creatinine, Ser 0.78; Potassium 4.7; Sodium 141 02/26/2024: Pro B Natriuretic peptide (BNP) 178.0; TSH 1.90  Recent Lipid Panel    Component Value Date/Time   CHOL 242 (H) 02/26/2024 0932   TRIG 161.0 (H) 02/26/2024 0932   HDL 78.90 02/26/2024 0932   CHOLHDL 3 02/26/2024 0932   VLDL 32.2 02/26/2024 0932   LDLCALC 131 (H) 02/26/2024 0932    PHYSICAL EXAM:    VS:  BP 108/76   Pulse (!) 40   Ht 5' 6 (1.676 m)   Wt 134 lb 9.6 oz (61.1 kg)   SpO2 94%   BMI 21.73 kg/m   BMI: Body mass index is 21.73 kg/m.  Physical Exam Vitals reviewed.  Constitutional:      Appearance: She is well-developed.  HENT:     Head: Normocephalic and atraumatic.  Eyes:     General:        Right eye: No discharge.        Left eye: No discharge.  Cardiovascular:     Rate and Rhythm: Regular rhythm. Bradycardia present.     Heart sounds: Normal heart sounds, S1 normal and S2 normal. Heart sounds not distant. No midsystolic click and no opening snap. No murmur heard.    No friction rub.  Pulmonary:     Effort: Pulmonary effort is normal. No respiratory distress.     Breath sounds: Normal breath sounds. No decreased breath sounds, wheezing, rhonchi or rales.  Chest:     Chest wall: No tenderness.  Musculoskeletal:     Cervical back: Normal range of motion.  Skin:    General: Skin is warm and dry.     Nails: There is no clubbing.  Neurological:     Mental Status: She is alert and oriented to person, place, and time.  Psychiatric:        Speech: Speech normal.        Behavior: Behavior normal.        Thought Content: Thought content normal.        Judgment: Judgment normal.     Wt Readings from Last 3 Encounters:  05/13/24 134 lb 9.6 oz (61.1 kg)  02/26/24 133  lb (60.3 kg)  02/25/24 131 lb 12.8 oz (59.8 kg)     ASSESSMENT & PLAN:   History of syncope: No further episodes.  Prior episode appears to be consistent with orthostasis following waking up from sleep and standing up to use the restroom.  Cardiac workup has been reassuring including echo and coronary CTA.  She has been noted to be bradycardic, though demonstrates appropriate chronotropic competence and has been at awaited by EP with no further intervention indicated at that time.  Status post ILR with no significant arrhythmia, prolonged pauses, or evidence of high grade AV block to date.  Sinus bradycardia: Asymptomatic.  Demonstrates appropriate chronotropic competence.  Not on rate limiting medications.  Evaluated by EP with no indication for PPM at this time.  Status post ILR.  Dyspnea: Cardiac workup reassuring including echo and coronary CTA.  Refer to pulmonology for consideration of PFTs.  If their workup is unrevealing would consider CPX.  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  Active at  baseline.  PSVT: Quiescent.  Not on beta-blocker or nondihydropyridine calcium channel blocker given baseline bradycardia.  Status post ILR.  HFpEF: Echo in 01/2024 showed normal LV diastolic function parameters normal right atrial pressure.  Unlikely contributing to his ongoing dyspnea at this time.  Remains on low-dose furosemide  20 mg.    Disposition: F/u with Dr. Darron or an APP in 3 months, and EP as directed.    Medication Adjustments/Labs and Tests Ordered: Current medicines are reviewed at length with the patient today.  Concerns regarding medicines are outlined above. Medication changes, Labs and Tests ordered today are summarized above and listed in the Patient Instructions accessible in Encounters.   Signed, Bernardino Bring, PA-C 05/13/2024 5:15 PM     [facility] [place] [address]  [address] [place], [place] [address] [phone]

## 2024-05-23 ENCOUNTER — Other Ambulatory Visit: Payer: Self-pay | Admitting: Primary Care

## 2024-05-23 DIAGNOSIS — G4700 Insomnia, unspecified: Secondary | ICD-10-CM

## 2024-05-28 ENCOUNTER — Ambulatory Visit (INDEPENDENT_AMBULATORY_CARE_PROVIDER_SITE_OTHER)

## 2024-05-28 DIAGNOSIS — R55 Syncope and collapse: Secondary | ICD-10-CM | POA: Diagnosis not present

## 2024-05-28 LAB — CUP PACEART REMOTE DEVICE CHECK
Date Time Interrogation Session: 20250828102420
Implantable Pulse Generator Implant Date: 20250527

## 2024-05-31 ENCOUNTER — Ambulatory Visit: Payer: Self-pay | Admitting: Cardiology

## 2024-06-11 NOTE — Progress Notes (Signed)
 Remote Loop Recorder Transmission

## 2024-06-16 ENCOUNTER — Telehealth: Payer: Self-pay | Admitting: *Deleted

## 2024-06-16 NOTE — Telephone Encounter (Signed)
 High alert received from [employer]:  Alert remote transmission: AF 3 new AF/AFL events 9/15, longest duration 1hr , no hx of PAF, no OAC per EPIC - route to triage high alert per protocol Presenting NSR Follow up as scheduled. [place], CV[facility]  _____________________________________________________________________________  [place] and spoke with patient who denied experiencing any symptoms during events in question  No hx of stroke or OAC  Routing to [place] for review and awareness

## 2024-06-22 NOTE — Telephone Encounter (Signed)
Attempted to contact patient. No answer, left message to call back

## 2024-06-22 NOTE — Telephone Encounter (Signed)
 Patient called back and scheduled in AF clinic tomorrow at 10 am  All questions and concerns addressed at this time  Patient appreciative for the assistance

## 2024-06-23 ENCOUNTER — Ambulatory Visit (HOSPITAL_COMMUNITY)
Admission: RE | Admit: 2024-06-23 | Discharge: 2024-06-23 | Disposition: A | Discharge status: Home / Self Care | Source: Ambulatory Visit | Attending: Internal Medicine | Admitting: Internal Medicine

## 2024-06-23 VITALS — BP 120/70 | HR 50 | Ht 66.0 in | Wt 133.0 lb

## 2024-06-23 DIAGNOSIS — I480 Paroxysmal atrial fibrillation: Secondary | ICD-10-CM | POA: Diagnosis not present

## 2024-06-23 DIAGNOSIS — I4891 Unspecified atrial fibrillation: Secondary | ICD-10-CM | POA: Diagnosis not present

## 2024-06-23 DIAGNOSIS — D6869 Other thrombophilia: Secondary | ICD-10-CM | POA: Diagnosis not present

## 2024-06-23 NOTE — Progress Notes (Addendum)
 Primary Care Physician: Gretta Comer POUR, NP Primary Cardiologist: Deatrice Cage, MD Electrophysiologist: Fonda Kitty, MD     Referring Physician: Device clinic      Morgan Herrera is a 73 y.o. female with a history of PSVT, chronic diastolic CHF, multiple sclerosis, PMR, migraines, sleep apnea, syncope, and atrial fibrillation/flutter who presents for consultation in the [facility]. ILR alert on 9/16 for 3 episodes on 9/15 with longest duration 1 hr 14 min. Patient has a CHADS2VASC score of 3.  On evaluation today, patient is currently in NSR. Patient notes to very likely have been asleep during 3 episodes of Afib. She did not have cardiac awareness. Patient has 1-2 cups of coffee daily, history of mild to moderate sleep apnea (sleep study 2023) treated with pillow to avoid sleeping on back, and a glass of wine nightly with dinner.   Today, she denies symptoms of palpitations, chest pain, shortness of breath, orthopnea, PND, lower extremity edema, dizziness, presyncope, syncope, bleeding, or neurologic sequela. The patient is tolerating medications without difficulties and is otherwise without complaint today.    Atrial Fibrillation Risk Factors:  she does have symptoms or diagnosis of sleep apnea. she is not compliant with CPAP therapy.  she has a BMI of Body mass index is 21.47 kg/m.SABRA Filed Weights   06/23/24 1002  Weight: 60.3 kg    Current Outpatient Medications  Medication Sig Dispense Refill   calcium carbonate (OS-CAL - DOSED IN MG OF ELEMENTAL CALCIUM) 1250 (500 Ca) MG tablet Take 1 tablet by mouth daily.     cholecalciferol (VITAMIN D ) 1000 units tablet Take 2,000 Units by mouth daily.     cyanocobalamin  500 MCG tablet Take 500 mcg by mouth daily.     escitalopram  (LEXAPRO ) 10 MG tablet TAKE 1 TABLET BY MOUTH DAILY FOR DEPRESSION 90 tablet 2   furosemide  (LASIX ) 20 MG tablet TAKE 1 TABLET BY MOUTH EVERY DAY 90 tablet 2    gabapentin  (NEURONTIN ) 100 MG capsule TAKE 1 CAPSULE BY MOUTH THREE TIMES A DAY (Patient taking differently: Take 100 mg by mouth as needed. TAKE 1 CAPSULE BY MOUTH THREE TIMES A DAY) 270 capsule 3   Magnesium 250 MG TABS Take by mouth. (Patient taking differently: Take 1 tablet by mouth every morning.)     MELATONIN GUMMIES PO Takes 1 gummy by mouth at night for sleep     Omega-3 Fatty Acids (FISH OIL PEARLS PO) Take 1,400 mg by mouth daily.     traZODone  (DESYREL ) 50 MG tablet TAKE 1 TABLET BY MOUTH EVERY DAY AT BEDTIME FOR SLEEP (Patient taking differently: Take 25 mg by mouth at bedtime.) 90 tablet 2   No current facility-administered medications for this encounter.    Atrial Fibrillation Management history:  Previous antiarrhythmic drugs: none Previous cardioversions: none Previous ablations: none Anticoagulation history: none   ROS- All systems are reviewed and negative except as per the HPI above.  Physical Exam: BP 120/70   Pulse (!) 50   Ht 5' 6 (1.676 m)   Wt 60.3 kg   BMI 21.47 kg/m   GEN: Well nourished, well developed in no acute distress NECK: No JVD; No carotid bruits CARDIAC: Regular rate and rhythm, no murmurs, rubs, gallops RESPIRATORY:  Clear to auscultation without rales, wheezing or rhonchi  ABDOMEN: Soft, non-tender, non-distended EXTREMITIES:  No edema; No deformity   EKG today demonstrates  Vent. rate 50 BPM PR interval 202 ms QRS duration 80 ms QT/QTcB 444/404  ms P-R-T axes 55 96 62 Sinus bradycardia Rightward axis Borderline ECG When compared with ECG of 13-May-2024 08:13, No significant change was found  Echo 02/17/24 demonstrated   1. Left ventricular ejection fraction, by estimation, is 55 to 60%. The  left ventricle has normal function. The left ventricle has no regional  wall motion abnormalities. There is mild left ventricular hypertrophy.  Left ventricular diastolic parameters  were normal. The average left ventricular global  longitudinal strain is  -21.3 %. The global longitudinal strain is normal.   2. Right ventricular systolic function is normal. The right ventricular  size is normal.   3. The mitral valve is normal in structure. Mild mitral valve  regurgitation.   4. The aortic valve is tricuspid. Aortic valve regurgitation is mild.   5. The inferior vena cava is normal in size with greater than 50%  respiratory variability, suggesting right atrial pressure of 3 mmHg.   ASSESSMENT & PLAN CHA2DS2-VASc Score = 3  The patient's score is based upon: CHF History: 1 HTN History: 0 Diabetes History: 0 Stroke History: 0 Vascular Disease History: 0 Age Score: 1 Gender Score: 1       ASSESSMENT AND PLAN: Paroxysmal Atrial Fibrillation (ICD10:  I48.0) The patient's CHA2DS2-VASc score is 3, indicating a 3.2% annual risk of stroke.    Patient is currently in NSR. Education provided about Afib. Discussion about triggers for Afib and treatment of OSA. We discussed reduction of potential triggers including limiting alcohol intake. Patient has follow up with Pulmonology tomorrow, recommended to mention the probability of requiring treatment for mild to moderate OSA especially with new Afib. After discussion, we will proceed with conservative observation at this time. She has an Apple watch to monitor rhythm.   No indication for rate control agent at this time, especially given baseline bradycardia will avoid AV nodal agents.   Secondary Hypercoagulable State (ICD10:  D68.69) The patient is at significant risk for stroke/thromboembolism based upon her CHA2DS2-VASc Score of 3.  We discussed the reasoning behind anticoagulation in the setting of stroke prevention related to Afib. We discussed the benefits vs risks of anticoagulation. Patient's longest episode of Afib was 1 hr and 14 minutes. Due to abbreviated episode, we discussed watchful waiting for Afib burden to help guide initiation of anticoagulation. After  discussion, patient agrees will hold off on anticoagulation for now. Will not start OAC. We will continue conservative observation via subsequent ILR checks.   Follow up 6 months Afib clinic.   [name], [facility]  Afib Clinic [address] [place], [place] [address] [phone]

## 2024-06-24 ENCOUNTER — Ambulatory Visit: Admitting: Internal Medicine

## 2024-06-24 ENCOUNTER — Encounter: Payer: Self-pay | Admitting: Internal Medicine

## 2024-06-24 VITALS — BP 110/80 | HR 55 | Temp 97.6°F | Ht 65.0 in | Wt 133.4 lb

## 2024-06-24 DIAGNOSIS — R0602 Shortness of breath: Secondary | ICD-10-CM

## 2024-06-24 DIAGNOSIS — G4733 Obstructive sleep apnea (adult) (pediatric): Secondary | ICD-10-CM | POA: Diagnosis not present

## 2024-06-24 DIAGNOSIS — R9389 Abnormal findings on diagnostic imaging of other specified body structures: Secondary | ICD-10-CM

## 2024-06-24 NOTE — Progress Notes (Signed)
 Name: Morgan Herrera MRN: [id number] DOB: 11-01-1950       STUDIES:    7.24.19 CT chest Independently reviewed by Me B/l emphysema LUL lung nodule  Home sleep study on 03/13/2022 showed moderate obstructive sleep apnea, AHI 16.2/hour with SPO2 low 76% (average 91%).  We discussed sleep study results today along with possible treatment options.   -Therapy with oral appliance along with wedge pillow  CC Follow up OSA Follow up LUL lung nodule  HISTORY OF PRESENT ILLNESS: Patient referred back to us  from cardiology for A-fib Patient with underlying diagnosis of sleep apnea in 2023 AHI of 16 Patient refused CPAP therapy at that time and tried oral compliance however did not work Patient obtained a wedge pillow which did not work Currently has a moon shaped pillow which seems to be helping however still with signs and symptoms of fatigue and excessive daytime sleepiness She has new symptoms of shortness of breath with activity Follow-up cardiology assessment showed abnormal stress test further workup is pending however referred to us  for further evaluation for shortness of breath  Patient is a former smoker 1 pack a day for 30 years quit in 2000 No history of asthma or COPD in the past Previous pulmonary function test noted to be within normal limits She has no allergic rhinitis no significant allergies no triggers Lives with a cat  No exacerbation at this time No evidence of heart failure at this time No evidence or signs of infection at this time No respiratory distress No fevers, chills, nausea, vomiting, diarrhea No evidence of lower extremity edema No evidence hemoptysis   CT of the chest from 2019 reviewed with patient CT chest shows  LUL nodule Recommend repeat CT chest     CT CHEST 2019  LUL nodular pleural based opacity    BP 110/80   Pulse (!) 55   Temp 97.6 F (36.4 C)   Ht 5' 5 (1.651 m)   Wt 133 lb 6.4 oz (60.5 kg)   SpO2 95%   BMI 22.20  kg/m    Review of Systems: Gen:  Denies  fever, sweats, chills weight loss  HEENT: Denies blurred vision, double vision, ear pain, eye pain, hearing loss, nose bleeds, sore throat Cardiac:  No dizziness, chest pain or heaviness, chest tightness,edema, No JVD Resp:   No cough, -sputum production, -shortness of breath,-wheezing, -hemoptysis,  Other:  All other systems negative   Physical Examination:   General Appearance: No distress  EYES PERRLA, EOM intact.   NECK Supple, No JVD Pulmonary: normal breath sounds, No wheezing.  CardiovascularNormal S1,S2.  No m/r/g.   Abdomen: Benign, Soft, non-tender. Neurology UE/LE 5/5 strength, no focal deficits Ext pulses intact, cap refill intact ALL OTHER ROS ARE NEGATIVE   CBC    Component Value Date/Time   WBC 6.0 02/09/2024 0021   RBC 4.58 02/09/2024 0021   HGB 12.8 02/09/2024 0021   HGB 12.5 12/24/2019 0000   HCT 39.9 02/09/2024 0021   HCT 38.4 12/24/2019 0000   PLT 238 02/09/2024 0021   MCV 87.1 02/09/2024 0021   MCV 88 12/24/2019 0000   MCH 27.9 02/09/2024 0021   MCHC 32.1 02/09/2024 0021   RDW 13.4 02/09/2024 0021   RDW 13.3 12/24/2019 0000   LYMPHSABS 1,912 05/10/2023 1620   LYMPHSABS 1.1 12/24/2019 0000   MONOABS 0.6 08/07/2021 1010   EOSABS 213 05/10/2023 1620   EOSABS 0.2 12/24/2019 0000   BASOSABS 111 05/10/2023 1620   BASOSABS 0.1  12/24/2019 0000      Latest Ref Rng & Units 02/10/2024   12:13 PM 02/09/2024   12:21 AM 08/10/2022   11:30 AM  BMP  Glucose 70 - 99 mg/dL 87  871  897   BUN 8 - 27 mg/dL 22  25  23    Creatinine 0.57 - 1.00 mg/dL 9.21  9.20  9.20   BUN/Creat Ratio 12 - 28 28     Sodium 134 - 144 mmol/L 141  137  140   Potassium 3.5 - 5.2 mmol/L 4.7  3.5  4.1   Chloride 96 - 106 mmol/L 102  101  105   CO2 20 - 29 mmol/L 25  27  28    Calcium 8.7 - 10.3 mg/dL 9.8  9.2  89.8          ASSESSMENT / PLAN:  73 year old pleasant white female seen today for abnormal CT chest with underlying  diagnosis of moderate sleep apnea AHI of 16 with a history of abnormal CT chest with left upper lobe nodule pleural-based with extensive smoking history in the past   Assessment of OSA  Recommend repeat home sleep study and plan for CPAP therapy  Patient with intermittent A-fib  Full cardiology workup still pending   Assessment of COPD and shortness of breath  Recommend pulmonary function test  Previous pulmonary function test were within normal limits  No exacerbation at this time No evidence of heart failure at this time No evidence or signs of infection at this time No respiratory distress No fevers, chills, nausea, vomiting, diarrhea No evidence of lower extremity edema No evidence hemoptysis Avoid Allergens and Irritants Avoid secondhand smoke Avoid SICK contacts Recommend  Masking  when appropriate Recommend Keep up-to-date with vaccinations   Abnormal CT chest  assessment of CT scan patient had 2 CT scans one in July 2019  in October 2019 in the left upper lobe nodule seems to be pleural-based  Patient will need repeat CT chest at this time   MEDICATION ADJUSTMENTS/LABS AND TESTS ORDERED: Home sleep test PFT CT chest Avoid Allergens and Irritants Avoid secondhand smoke Avoid SICK contacts Recommend  Masking  when appropriate Recommend Keep up-to-date with vaccinations    CURRENT MEDICATIONS REVIEWED AT LENGTH WITH PATIENT TODAY   Patient  satisfied with Plan of action and management. All questions answered   Follow up 3 months   I spent a total of 46 minutes dedicated to the care of this patient on the date of this encounter to include pre-visit review of records, face-to-face time with the patient discussing conditions above, post visit ordering of testing, clinical documentation with the electronic health record, making appropriate referrals as documented, and communicating necessary information to the patient's healthcare team.    The Patient requires  high complexity decision making for assessment and support, frequent evaluation and titration of therapies, application of advanced monitoring technologies and extensive interpretation of multiple databases.  Patient satisfied with Plan of action and management. All questions answered    Nickolas Alm Cellar, M.D.  [facility]  Medical Director [facility] Medical Director [facility] Cardio-Pulmonary Department

## 2024-06-24 NOTE — Patient Instructions (Addendum)
 Recommend home sleep test to assess for sleep apnea Recommend CT chest to assess lung fields Recommend pulmonary function test to assess for breathing status of lungs  Avoid Allergens and Irritants Avoid secondhand smoke Avoid SICK contacts Recommend  Masking  when appropriate Recommend Keep up-to-date with vaccinations

## 2024-06-29 ENCOUNTER — Ambulatory Visit

## 2024-06-29 DIAGNOSIS — I4891 Unspecified atrial fibrillation: Secondary | ICD-10-CM

## 2024-06-29 LAB — CUP PACEART REMOTE DEVICE CHECK
Date Time Interrogation Session: 20250928235445
Implantable Pulse Generator Implant Date: 20250527

## 2024-06-30 ENCOUNTER — Encounter

## 2024-06-30 DIAGNOSIS — G4733 Obstructive sleep apnea (adult) (pediatric): Secondary | ICD-10-CM

## 2024-06-30 DIAGNOSIS — G4730 Sleep apnea, unspecified: Secondary | ICD-10-CM | POA: Diagnosis not present

## 2024-07-01 NOTE — Progress Notes (Signed)
 Remote Loop Recorder Transmission

## 2024-07-03 ENCOUNTER — Telehealth: Payer: Self-pay

## 2024-07-03 NOTE — Telephone Encounter (Signed)
 I have spoke with Morgan Herrera and her CT has been scheduled on 07/06/24 @ 9:30am patient is aware of the appt at [facility]

## 2024-07-03 NOTE — Telephone Encounter (Signed)
 Copied from CRM [id number]. Topic: Appointments - Scheduling Inquiry for Clinic >> Jul 03, 2024  9:19 AM Corean SAUNDERS wrote: Reason for CRM: Patient is requesting a call back to schedule her CT per the order in her chart from Dr. Isaiah.

## 2024-07-05 ENCOUNTER — Ambulatory Visit: Payer: Self-pay | Admitting: Cardiology

## 2024-07-06 ENCOUNTER — Ambulatory Visit
Admission: RE | Admit: 2024-07-06 | Discharge: 2024-07-06 | Disposition: A | Discharge status: Home / Self Care | Source: Ambulatory Visit | Attending: Internal Medicine | Admitting: Internal Medicine

## 2024-07-06 DIAGNOSIS — R9389 Abnormal findings on diagnostic imaging of other specified body structures: Secondary | ICD-10-CM | POA: Diagnosis not present

## 2024-07-06 DIAGNOSIS — I700 Atherosclerosis of aorta: Secondary | ICD-10-CM | POA: Diagnosis not present

## 2024-07-06 DIAGNOSIS — R0602 Shortness of breath: Secondary | ICD-10-CM | POA: Diagnosis not present

## 2024-07-06 DIAGNOSIS — R911 Solitary pulmonary nodule: Secondary | ICD-10-CM | POA: Diagnosis not present

## 2024-07-09 ENCOUNTER — Telehealth: Admitting: Physician Assistant

## 2024-07-09 ENCOUNTER — Ambulatory Visit: Payer: Self-pay | Admitting: Internal Medicine

## 2024-07-09 DIAGNOSIS — R0602 Shortness of breath: Secondary | ICD-10-CM

## 2024-07-09 DIAGNOSIS — J069 Acute upper respiratory infection, unspecified: Secondary | ICD-10-CM

## 2024-07-09 DIAGNOSIS — R079 Chest pain, unspecified: Secondary | ICD-10-CM

## 2024-07-09 NOTE — Progress Notes (Signed)
  Because of shortness of breath and noted chest pain with these symptoms, I feel your condition warrants further evaluation and I recommend that you be seen in a face-to-face visit.   NOTE: There will be NO CHARGE for this E-Visit   If you are having a true medical emergency, please call 911.     For an urgent face to face visit, [place] has multiple urgent care centers for your convenience.  Click the link below for the full list of locations and hours, walk-in wait times, appointment scheduling options and driving directions:  Urgent Care - [place], [place], [place], [place], [place], [place]  [place]     Your MyChart E-visit questionnaire answers were reviewed by a board certified advanced clinical practitioner to complete your personal care plan based on your specific symptoms.    Thank you for using e-Visits.

## 2024-07-10 ENCOUNTER — Ambulatory Visit (INDEPENDENT_AMBULATORY_CARE_PROVIDER_SITE_OTHER): Admitting: Family Medicine

## 2024-07-10 VITALS — BP 120/82 | HR 42 | Temp 97.7°F | Ht 65.0 in | Wt 133.4 lb

## 2024-07-10 DIAGNOSIS — R051 Acute cough: Secondary | ICD-10-CM

## 2024-07-10 MED ORDER — AZITHROMYCIN 250 MG PO TABS: ORAL_TABLET | ORAL | 0 refills | Status: AC

## 2024-07-10 MED ORDER — GUAIFENESIN-CODEINE 100-10 MG/5ML PO SOLN: 5.0000 mL | Freq: Every day | ORAL | 0 refills | Status: DC

## 2024-07-10 MED ORDER — PREDNISONE 10 MG PO TABS: ORAL_TABLET | ORAL | 0 refills | Status: DC

## 2024-07-10 NOTE — Assessment & Plan Note (Signed)
 Acute, symptoms ongoing 8 days but no clear sign of bacterial infection.  Most likely viral upper respiratory tract infection. She has reactive airway symptoms with constant hacking cough and coughing fits.  Will treat with prednisone  taper 40 2010 over 7 days.  Can use cough suppressant syrup at bedtime and Mucinex DM during the day. Recommend rest and fluids. Given it is the weekend I will send in a prescription for antibiotic (Z-Pak) for her to start if her symptoms worsen or if new fever suggestive of bacterial superinfection.  Return ER for symptoms provided.  If she has severe shortness of breath go to the emergency room.

## 2024-07-10 NOTE — Progress Notes (Signed)
 Patient ID: Morgan Herrera, female    DOB: 15-Aug-1951, 73 y.o.   MRN: [id number]  This visit was conducted in person.  BP 120/82   Pulse (!) 42   Temp 97.7 F (36.5 C) (Temporal)   Ht 5' 5 (1.651 m)   Wt 133 lb 6.4 oz (60.5 kg)   SpO2 97%   BMI 22.20 kg/m    CC:  Chief Complaint  Patient presents with   Nasal Congestion    Pt complains of nasal congestion, coughing, sore throat, slight body aches. Pt took covid test and was negative. Ongoing since Saturday. Throat started to hurt last Friday.     Subjective:   HPI: Morgan Herrera is a 73 y.o. female presenting on 07/10/2024 for Nasal Congestion (Pt complains of nasal congestion, coughing, sore throat, slight body aches. Pt took covid test and was negative. Ongoing since Saturday. Throat started to hurt last Friday. )  Date of onset: 8 days ago Initial symptoms included sore throat, nasal congestion Symptoms progressed to cough,  productive and bodyaches  Coughing fits, tickle in throat.   No fever  Bilateral ear pain, left most, has some sinus pressure, no pain.  Slightly worse SOB   Cough keeping her up at night    Sick contacts: none COVID testing:   negative     She has tried to treat with  OTC mucinex    No history of chronic lung disease such as asthma or COPD. Non-smoker.  She has history of MS, PMR..  Not currently on immunotherapy.     Relevant past medical, surgical, family and social history reviewed and updated as indicated. Interim medical history since our last visit reviewed. Allergies and medications reviewed and updated. Outpatient Medications Prior to Visit  Medication Sig Dispense Refill   calcium carbonate (OS-CAL - DOSED IN MG OF ELEMENTAL CALCIUM) 1250 (500 Ca) MG tablet Take 1 tablet by mouth daily.     cholecalciferol (VITAMIN D ) 1000 units tablet Take 2,000 Units by mouth daily.     cyanocobalamin  500 MCG tablet Take 500 mcg by mouth daily.     escitalopram  (LEXAPRO ) 10 MG  tablet TAKE 1 TABLET BY MOUTH DAILY FOR DEPRESSION 90 tablet 2   FLUZONE HIGH-DOSE 0.5 ML injection Inject 0.5 mLs into the muscle once.     furosemide  (LASIX ) 20 MG tablet TAKE 1 TABLET BY MOUTH EVERY DAY 90 tablet 2   gabapentin  (NEURONTIN ) 100 MG capsule TAKE 1 CAPSULE BY MOUTH THREE TIMES A DAY (Patient taking differently: Take 100 mg by mouth as needed. TAKE 1 CAPSULE BY MOUTH THREE TIMES A DAY) 270 capsule 3   Magnesium 250 MG TABS Take by mouth. (Patient taking differently: Take 1 tablet by mouth every morning.)     MELATONIN GUMMIES PO Takes 1 gummy by mouth at night for sleep     Omega-3 Fatty Acids (FISH OIL PEARLS PO) Take 1,400 mg by mouth daily.     PREVNAR 20 0.5 ML injection Inject 0.5 mLs into the muscle once.     traZODone  (DESYREL ) 50 MG tablet TAKE 1 TABLET BY MOUTH EVERY DAY AT BEDTIME FOR SLEEP (Patient taking differently: Take 25 mg by mouth at bedtime.) 90 tablet 2   metoprolol  tartrate (LOPRESSOR ) 25 MG tablet Take 25 mg by mouth 2 (two) times daily.     No facility-administered medications prior to visit.     Per HPI unless specifically indicated in ROS section below Review of Systems  Constitutional:  Negative for fatigue, fever and unexpected weight change.  HENT:  Negative for congestion, ear pain, sinus pressure, sneezing, sore throat and trouble swallowing.   Eyes:  Negative for pain and itching.  Respiratory:  Negative for cough, shortness of breath and wheezing.   Cardiovascular:  Negative for chest pain, palpitations and leg swelling.  Gastrointestinal:  Negative for abdominal pain, blood in stool, constipation, diarrhea and nausea.  Genitourinary:  Negative for difficulty urinating, dysuria, hematuria, menstrual problem and vaginal discharge.  Skin:  Negative for rash.  Neurological:  Negative for syncope, weakness, light-headedness, numbness and headaches.  Psychiatric/Behavioral:  Negative for confusion and dysphoric mood. The patient is not  nervous/anxious.    Objective:  BP 120/82   Pulse (!) 42   Temp 97.7 F (36.5 C) (Temporal)   Ht 5' 5 (1.651 m)   Wt 133 lb 6.4 oz (60.5 kg)   SpO2 97%   BMI 22.20 kg/m   Wt Readings from Last 3 Encounters:  07/10/24 133 lb 6.4 oz (60.5 kg)  06/24/24 133 lb 6.4 oz (60.5 kg)  06/23/24 133 lb (60.3 kg)      Physical Exam Constitutional:      General: She is not in acute distress.    Appearance: Normal appearance. She is well-developed. She is not ill-appearing or toxic-appearing.  HENT:     Head: Normocephalic.     Right Ear: Hearing, tympanic membrane, ear canal and external ear normal. Tympanic membrane is not erythematous, retracted or bulging.     Left Ear: Hearing, tympanic membrane, ear canal and external ear normal. Tympanic membrane is not erythematous, retracted or bulging.     Nose: Congestion present. No mucosal edema or rhinorrhea.     Right Sinus: No maxillary sinus tenderness or frontal sinus tenderness.     Left Sinus: No maxillary sinus tenderness or frontal sinus tenderness.     Mouth/Throat:     Pharynx: Uvula midline.  Eyes:     General: Lids are normal. Lids are everted, no foreign bodies appreciated.     Conjunctiva/sclera: Conjunctivae normal.     Pupils: Pupils are equal, round, and reactive to light.  Neck:     Thyroid : No thyroid  mass or thyromegaly.     Vascular: No carotid bruit.     Trachea: Trachea normal.  Cardiovascular:     Rate and Rhythm: Normal rate and regular rhythm.     Pulses: Normal pulses.     Heart sounds: Normal heart sounds, S1 normal and S2 normal. No murmur heard.    No friction rub. No gallop.  Pulmonary:     Effort: Pulmonary effort is normal. No tachypnea or respiratory distress.     Breath sounds: Normal breath sounds. No decreased breath sounds, wheezing, rhonchi or rales.  Abdominal:     General: Bowel sounds are normal.     Palpations: Abdomen is soft.     Tenderness: There is no abdominal tenderness.   Musculoskeletal:     Cervical back: Normal range of motion and neck supple.  Skin:    General: Skin is warm and dry.     Findings: No rash.  Neurological:     Mental Status: She is alert.  Psychiatric:        Mood and Affect: Mood is not anxious or depressed.        Speech: Speech normal.        Behavior: Behavior normal. Behavior is cooperative.        Thought  Content: Thought content normal.        Judgment: Judgment normal.       Results for orders placed or performed in visit on 06/29/24  CUP PACEART REMOTE DEVICE CHECK   Collection Time: 06/28/24 11:54 PM  Result Value Ref Range   Date Time Interrogation Session [id number]    Pulse Generator Manufacturer MERM    Pulse Gen Model LNQ22 LINQ II    Pulse Gen Serial Number [id number]    Clinic Name [facility]    Implantable Pulse Generator Type ICM/ILR    Implantable Pulse Generator Implant Date [id number]     Assessment and Plan  Acute cough Assessment & Plan: Acute, symptoms ongoing 8 days but no clear sign of bacterial infection.  Most likely viral upper respiratory tract infection. She has reactive airway symptoms with constant hacking cough and coughing fits.  Will treat with prednisone  taper 40 2010 over 7 days.  Can use cough suppressant syrup at bedtime and Mucinex DM during the day. Recommend rest and fluids. Given it is the weekend I will send in a prescription for antibiotic (Z-Pak) for her to start if her symptoms worsen or if new fever suggestive of bacterial superinfection.  Return ER for symptoms provided.  If she has severe shortness of breath go to the emergency room.   Other orders -     predniSONE ; 3 tabs by mouth daily x 3 days, then 2 tabs by mouth daily x 2 days then 1 tab by mouth daily x 2 days  Dispense: 15 tablet; Refill: 0 -     guaiFENesin-Codeine; Take 5-10 mLs by mouth at bedtime.  Dispense: 100 mL; Refill: 0 -     Azithromycin; Take 2 tablets on day 1, then 1 tablet daily on days 2  through 5  Dispense: 6 tablet; Refill: 0    No follow-ups on file.   Greig Ring, MD

## 2024-07-13 DIAGNOSIS — G4733 Obstructive sleep apnea (adult) (pediatric): Secondary | ICD-10-CM | POA: Diagnosis not present

## 2024-07-13 NOTE — Telephone Encounter (Addendum)
 Call patient  Sleep study result  Date of study: 06/30/2024  Impression: Mild obstructive sleep apnea with moderate oxygen desaturations. AHI of 8.7 with O2 nadir of 75%. Saturations below 88% for 14 minutes, 4% of recording.  Recommendation:  Options for treating mild obstructive sleep apnea may include CPAP therapy if there are significant daytime symptoms or notable comorbidities. Auto CPAP 5-15 with heated humidification and the patient's preferred mask may be considered; other treatment options may include an oral device, watchful waiting with significant weight loss efforts.  Recommend CPAP therapy in the context of daytime sleepiness, intermittent atrial fibrillation  Close clinical follow-up for optimization of treatment

## 2024-07-14 NOTE — Progress Notes (Signed)
 Below message was applied in error.

## 2024-07-29 NOTE — Addendum Note (Signed)
 Addended by: VICCI SELLER A on: 07/29/2024 08:38 AM   Modules accepted: Orders

## 2024-07-29 NOTE — Progress Notes (Signed)
 [facility] Summary Report / Loop Recorder

## 2024-07-30 ENCOUNTER — Other Ambulatory Visit: Payer: Self-pay | Admitting: Medical Genetics

## 2024-07-30 ENCOUNTER — Ambulatory Visit (INDEPENDENT_AMBULATORY_CARE_PROVIDER_SITE_OTHER)

## 2024-07-30 DIAGNOSIS — I4891 Unspecified atrial fibrillation: Secondary | ICD-10-CM | POA: Diagnosis not present

## 2024-07-30 DIAGNOSIS — Z006 Encounter for examination for normal comparison and control in clinical research program: Secondary | ICD-10-CM

## 2024-07-30 LAB — CUP PACEART REMOTE DEVICE CHECK
Date Time Interrogation Session: 20251029235243
Implantable Pulse Generator Implant Date: 20250527

## 2024-08-02 ENCOUNTER — Ambulatory Visit: Payer: Self-pay | Admitting: Cardiology

## 2024-08-04 ENCOUNTER — Ambulatory Visit (INDEPENDENT_AMBULATORY_CARE_PROVIDER_SITE_OTHER)

## 2024-08-04 DIAGNOSIS — R0602 Shortness of breath: Secondary | ICD-10-CM

## 2024-08-04 LAB — PULMONARY FUNCTION TEST
DL/VA % pred: 71 %
DL/VA: 2.94 ml/min/mmHg/L
DLCO unc % pred: 63 %
DLCO unc: 12.45 ml/min/mmHg
FEF 25-75 Post: 2.04 L/s
FEF 25-75 Pre: 1.94 L/s
FEF2575-%Change-Post: 5 %
FEF2575-%Pred-Post: 109 %
FEF2575-%Pred-Pre: 104 %
FEV1-%Change-Post: 0 %
FEV1-%Pred-Post: 93 %
FEV1-%Pred-Pre: 92 %
FEV1-Post: 2.14 L
FEV1-Pre: 2.13 L
FEV1FVC-%Change-Post: 0 %
FEV1FVC-%Pred-Pre: 105 %
FEV6-%Change-Post: 0 %
FEV6-%Pred-Post: 92 %
FEV6-%Pred-Pre: 92 %
FEV6-Post: 2.68 L
FEV6-Pre: 2.69 L
FEV6FVC-%Pred-Post: 104 %
FEV6FVC-%Pred-Pre: 104 %
FVC-%Change-Post: 0 %
FVC-%Pred-Post: 88 %
FVC-%Pred-Pre: 88 %
FVC-Post: 2.69 L
FVC-Pre: 2.69 L
Post FEV1/FVC ratio: 80 %
Post FEV6/FVC ratio: 100 %
Pre FEV1/FVC ratio: 79 %
Pre FEV6/FVC Ratio: 100 %
RV % pred: 99 %
RV: 2.28 L
TLC % pred: 95 %
TLC: 4.97 L

## 2024-08-04 NOTE — Patient Instructions (Signed)
 Full PFT completed today ? ?

## 2024-08-04 NOTE — Progress Notes (Signed)
 Full PFT completed today ? ?

## 2024-08-05 NOTE — Progress Notes (Signed)
 Remote Loop Recorder Transmission

## 2024-08-15 NOTE — Progress Notes (Unsigned)
 Cardiology Office Note    Date:  08/17/2024   ID:  Morgan Herrera, DOB 1950/12/31, MRN [id number]  PCP:  Gretta Comer POUR, NP  Cardiologist:  Deatrice Cage, MD  Electrophysiologist:  Fonda Kitty, MD   Chief Complaint: Follow up  History of Present Illness:   Morgan Herrera is a 73 y.o. female with history of chest pain with suspected endothelial dysfunction, PAF diagnosed in 06/2024 not on anticoagulation followed by A-fib clinic, PSVT, bradycardia, multiple sclerosis, optic neuritis, polymyalgia rheumatica, migraines, OSA, and remote tobacco use who presents for follow up of bradycardia.   She was admitted in 03/2018 with fatigue, shortness of breath, and chest pain.  D-dimer was elevated.  CTA of the chest showed no evidence of PE.  Echo demonstrated a preserved LV systolic function.  Stress test was aborted due to hypertensive response with exercise.  LHC showed normal coronary arteries with sluggish flow in the LAD suggestive of endothelial dysfunction.  EF was normal.  LVEDP was mildly elevated.  She did not tolerate long-acting nitroglycerin  due to headache.  PFTs in 07/2018 showed no evidence of obstructive or restrictive lung disease.  Echo in 03/2019 showed normal LV systolic function and diastolic function with mild to moderate mitral regurgitation.  Zio patch in 08/2020, due to intermittent palpitations, showed normal sinus rhythm with an average heart rate of 58 bpm with a low heart rate of 39 bpm in the morning, 166 episodes of SVT with the longest episode lasting 3 minutes and 15 seconds with an average rate of 115 bpm and correlated with symptoms.  Regarding her longstanding sinus bradycardia, she has demonstrated appropriate chronotropic competence, and there was no indication for PPM.     She was seen in the ED on 02/09/2024 after standing up to go to the restroom overnight, got lightheaded, and passed out, hitting her head.  EKG showed sinus bradycardia with a  first-degree AV block with a heart rate of 53 bpm.  High-sensitivity troponin negative x 2.  CT head and cervical spine without acute processes.  She followed up in the office on 02/10/2024 reporting a 60-month history of increased shortness of breath with associated dizziness and presyncope.  Echo in 01/2024 showed an EF of 55 to 60%, no regional wall motion abnormalities, mild LVH, normal LV diastolic function parameters, normal RV systolic function and ventricular cavity size, mild mitral regurgitation, mild aortic insufficiency, and an estimated right atrial pressure of 3 mmHg.  Coronary CTA in 03/2024 showed a calcium score of 0 with no evidence of CAD.   She was evaluated by EP and underwent loop recorder implantation in 03/2024.  ETT in 03/2024 showed decreased exercise capacity that was clinically negative and electrically nondiagnostic for ischemia with patient achieving 78% of max predicted heart rate.  She was able to increase heart rate to 98 bpm with one-to-one conduction with EP recommending no further intervention if she remained asymptomatic.  She followed up with general cardiology in 05/2024 noting continued exertional shortness of breath and was largely sedentary at baseline.  She was without further syncopal episodes.  She was referred to pulmonology for further evaluation of dyspnea with subsequent PFTs and CT chest noted to be unrevealing.  Sleep study did show moderate OSA.  In 06/2024, loop recorder identified new onset A-fib lasting 1 hour and 14 minutes.  She was subsequently evaluated by the A-fib clinic with recommendation for watchful waiting without initiation of OAC.  She comes in accompanied by her husband  today and notes a progression in exertional fatigue, near syncope, and shortness of breath.  She reports her smart watch frequently alerts her to heart rates less than 40 bpm.  She has been without frank syncopal episodes since her above event.  She is without symptoms of angina.  No  lower extremity swelling or progressive orthopnea.  She is quite concerned about her exertional fatigue, near-syncope, and shortness of breath as this is limiting her functional status.   Labs independently reviewed: 01/2024 - TSH normal, A1c 5.9, TC 242, TG 161, HDL 78, LDL 131, BNP 178, BUN 22, serum creatinine 0.78, potassium 4.7, Hgb 12.8, PLT 238, albumin 4.1, AST/ALT normal   Past Medical History:  Diagnosis Date   (HFpEF) heart failure with preserved ejection fraction (HCC)    a. 03/2018 Echo: EF 55-60%, no rwma. Mod MR. Nl PASP; b. 03/2019 Echo: EF 60-65%, ? DD, mod MR.   Acute CHF (congestive heart failure) (HCC) 04/23/2018   Arthritis slight   Cataract    Chest pain    a. 03/2018 Ex MV: rest only imaging showed ant wall perfusion defect. No stress imaging as pt developed hypotension w/ exercise; b. 05/2018 Cath: Nl cors w/ sluggish flow in the LAD suggestive of endothelial dysfxn. EF 55-65%.   Full body hives 09/12/2020   Loud snoring 02/02/2022   Migraine    Multiple sclerosis    Neuromuscular disorder (HCC) 1990 ?   PMR (polymyalgia rheumatica)    PND (post-nasal drip) 04/16/2022   Pulmonary nodule    a. 03/2018 CTA Chest: 9mm small spiculated nodular density @ the L lung apex. Rec f/u in 3 mos.   Puncture wound 03/04/2020   Relapsing remitting multiple sclerosis 12/13/2015   Shortness of breath 04/30/2018   Sleep apnea    Syncope and collapse 02/08/24    Past Surgical History:  Procedure Laterality Date   BREAST EXCISIONAL BIOPSY     BREAST SURGERY  1990   CARDIAC CATHETERIZATION  August 2018?   COLONOSCOPY WITH PROPOFOL  N/A 05/19/2015   Procedure: COLONOSCOPY WITH PROPOFOL ;  Surgeon: Deward CINDERELLA Piedmont, MD;  Location: [facility] ENDOSCOPY;  Service: Gastroenterology;  Laterality: N/A;   EYE SURGERY     1978 1st of 3 eye surgeries   FOOT SURGERY Left 10/30/2022   FRACTURE SURGERY  1967   LEFT HEART CATH AND CORONARY ANGIOGRAPHY N/A 04/25/2018   Procedure: LEFT HEART CATH AND  CORONARY ANGIOGRAPHY;  Surgeon: Darron Deatrice LABOR, MD;  Location: [facility] INVASIVE CV LAB;  Service: Cardiovascular;  Laterality: N/A;   TENDON REPAIR     TUBAL LIGATION  1981    Current Medications: Current Meds  Medication Sig   calcium carbonate (OS-CAL - DOSED IN MG OF ELEMENTAL CALCIUM) 1250 (500 Ca) MG tablet Take 1 tablet by mouth daily.   cholecalciferol (VITAMIN D ) 1000 units tablet Take 2,000 Units by mouth daily.   cyanocobalamin  500 MCG tablet Take 500 mcg by mouth daily.   escitalopram  (LEXAPRO ) 10 MG tablet TAKE 1 TABLET BY MOUTH DAILY FOR DEPRESSION   furosemide  (LASIX ) 20 MG tablet TAKE 1 TABLET BY MOUTH EVERY DAY   Magnesium 250 MG TABS Take by mouth.   MELATONIN GUMMIES PO Takes 1 gummy by mouth at night for sleep   Omega-3 Fatty Acids (FISH OIL PEARLS PO) Take 1,400 mg by mouth daily.   traZODone  (DESYREL ) 50 MG tablet TAKE 1 TABLET BY MOUTH EVERY DAY AT BEDTIME FOR SLEEP    Allergies:   Patient has no  known allergies.   Social History   Socioeconomic History   Marital status: Married    Spouse name: Not on file   Number of children: 1   Years of education: Not on file   Highest education level: Bachelor's degree (e.g., BA, AB, BS)  Occupational History   Not on file  Tobacco Use   Smoking status: Former    Current packs/day: 0.00    Average packs/day: 1 pack/day for 45.0 years (45.0 ttl pk-yrs)    Types: Cigarettes    Start date: 63    Quit date: 2000    Years since quitting: 25.8   Smokeless tobacco: Never  Vaping Use   Vaping status: Never Used  Substance and Sexual Activity   Alcohol use: Yes    Alcohol/week: 0.0 standard drinks of alcohol    Comment: 1 to 2 glass of wine at least 4 nights a week   Drug use: No   Sexual activity: Not on file  Other Topics Concern   Not on file  Social History Narrative   Married.   Moved here from [place]. [facility] after 35 years   Enjoys arts and crafts. Taking a painting class.   Walking with her spouse.     Reading, shopping.   Right handed   Social Drivers of Health   Financial Resource Strain: Low Risk  (07/09/2024)   Overall Financial Resource Strain (CARDIA)    Difficulty of Paying Living Expenses: Not hard at all  Food Insecurity: No Food Insecurity (07/09/2024)   Hunger Vital Sign    Worried About Running Out of Food in the Last Year: Never true    Ran Out of Food in the Last Year: Never true  Transportation Needs: No Transportation Needs (07/09/2024)   PRAPARE - Administrator, Civil Service (Medical): No    Lack of Transportation (Non-Medical): No  Physical Activity: Sufficiently Active (07/09/2024)   Exercise Vital Sign    Days of Exercise per Week: 4 days    Minutes of Exercise per Session: 60 min  Stress: No Stress Concern Present (07/09/2024)   [employer] of Occupational Health - Occupational Stress Questionnaire    Feeling of Stress: Not at all  Social Connections: Moderately Integrated (07/09/2024)   Social Connection and Isolation Panel    Frequency of Communication with Friends and Family: More than three times a week    Frequency of Social Gatherings with Friends and Family: More than three times a week    Attends Religious Services: Never    Database Administrator or Organizations: Yes    Attends Engineer, Structural: 1 to 4 times per year    Marital Status: Married     Family History:  The patient's family history includes Asthma in her father; Breast cancer in her maternal grandmother; Cancer in her maternal grandmother; Heart disease in her mother; Heart failure in her mother; Hypertension in her mother; Parkinson's disease in her mother; Parkinsonism in her mother.  ROS:   12-point review of systems is negative unless otherwise noted in the HPI.   EKGs/Labs/Other Studies Reviewed:    Studies reviewed were summarized above. The additional studies were reviewed today:  ETT 04/10/2024:   Exercise stress test:  Clinically negative,  electrically nondiagnostic for ischemia   Pt only achieved 73% maxi predicted HR( 109 bpm) and PRP of only 17,113.    No EKG changes for HR achieved.   Decreased exercise capacity __________   Coronary CTA 03/05/2024:  FINDINGS: Aorta:  Normal size.  No calcifications.  No dissection.   Aortic Valve:  Trileaflet.  No calcifications.   Coronary Arteries:  Normal coronary origin.  Right dominance.   RCA is a dominant artery. There is no plaque.   Left main gives rise to LAD and LCX arteries. LM has no disease.   LAD has no plaque.   LCX is a non-dominant artery.  There is no plaque.   Other findings:   Normal pulmonary vein drainage into the left atrium.   Normal left atrial appendage without a thrombus.   Normal size of the pulmonary artery.   IMPRESSION: 1. Coronary calcium score of 0.   2. Normal coronary origin with right dominance.   3. No evidence of CAD.   4. CAD-RADS 0. Consider non-atherosclerotic causes of chest pain. __________   2D echo 02/17/2024: 1. Left ventricular ejection fraction, by estimation, is 55 to 60%. The  left ventricle has normal function. The left ventricle has no regional  wall motion abnormalities. There is mild left ventricular hypertrophy.  Left ventricular diastolic parameters  were normal. The average left ventricular global longitudinal strain is  -21.3 %. The global longitudinal strain is normal.   2. Right ventricular systolic function is normal. The right ventricular  size is normal.   3. The mitral valve is normal in structure. Mild mitral valve  regurgitation.   4. The aortic valve is tricuspid. Aortic valve regurgitation is mild.   5. The inferior vena cava is normal in size with greater than 50%  respiratory variability, suggesting right atrial pressure of 3 mmHg.  __________   Zio patch 08/2020: Normal sinus rhythm with an average heart rate 58 bpm.  Lowest heart rate was 39 bpm at 7:18 in the morning. 166 episodes of SVT  noted.  The longest lasted 3 minutes and 15 seconds with an average rate of 115 bpm.  Most of these episodes were associated with symptoms of shortness of breath and palpitations. __________   2D echo 03/18/2019: 1. The left ventricle has normal systolic function with an ejection  fraction of 60-65%. The cavity size was normal. Left ventricular diastolic  Doppler parameters are consistent with pseudonormalization.   2. The right ventricle has normal systolic function. The cavity was  normal. There is no increase in right ventricular wall thickness.Unable to  estimate RVSP.   3. Mitral valve regurgitation is moderate. Unable to exclude mild valve  prolapse.  __________   [facility] 04/25/2018: The left ventricular systolic function is normal. LV end diastolic pressure is mildly elevated. The left ventricular ejection fraction is 55-65% by visual estimate.   1.  Normal coronary arteries.  Sluggish flow in the LAD suggestive of endothelial dysfunction. 2.  Normal LV systolic function.  Mildly elevated left ventricular end-diastolic pressure at 18 mmHg.  No significant mitral regurgitation   Recommendations: No significant obstructive coronary artery disease to explain the patient's symptoms.  She likely has mild degree of diastolic heart failure.  Recommend continuing small dose furosemide . Small dose long-acting nitroglycerin  can be considered for suspected endothelial dysfunction.  __________   [facility] 04/24/2018: Rest only images with anterior wall perfusion defect. The  patient was supposed to have a treadmill nuclear stress test but had hypotensive response with exercise and thus was not injected with stress dose.  The test was canceled and cardiac catheterization was recommended. __________   2D echo 04/24/2018: - Left ventricle: The cavity size was normal. Wall thickness was  normal. Systolic function was normal. The estimated ejection    fraction was in the range of 55% to 60%.  Wall motion was normal;    there were no regional wall motion abnormalities.  - Mitral valve: There was moderate regurgitation.  - Pulmonary arteries: Systolic pressure was within the normal    range.    EKG:  EKG is ordered today.  The EKG ordered today demonstrates marked sinus bradycardia, 40 bpm, first-degree AV block, no acute ST-T changes  Recent Labs: 02/09/2024: ALT 22; Hemoglobin 12.8; Platelets 238 02/10/2024: BUN 22; Creatinine, Ser 0.78; Potassium 4.7; Sodium 141 02/26/2024: Pro B Natriuretic peptide (BNP) 178.0; TSH 1.90  Recent Lipid Panel    Component Value Date/Time   CHOL 242 (H) 02/26/2024 0932   TRIG 161.0 (H) 02/26/2024 0932   HDL 78.90 02/26/2024 0932   CHOLHDL 3 02/26/2024 0932   VLDL 32.2 02/26/2024 0932   LDLCALC 131 (H) 02/26/2024 0932    PHYSICAL EXAM:    VS:  BP 114/72 (BP Location: Left Arm, Patient Position: Sitting, Cuff Size: Normal)   Pulse (!) 40   Ht 5' 6 (1.676 m)   Wt 134 lb (60.8 kg)   SpO2 98%   BMI 21.63 kg/m   BMI: Body mass index is 21.63 kg/m.  Physical Exam Vitals reviewed.  Constitutional:      Appearance: She is well-developed.  HENT:     Head: Normocephalic and atraumatic.  Eyes:     General:        Right eye: No discharge.        Left eye: No discharge.  Cardiovascular:     Rate and Rhythm: Regular rhythm. Bradycardia present.     Pulses:          Posterior tibial pulses are 2+ on the right side and 2+ on the left side.     Heart sounds: Normal heart sounds, S1 normal and S2 normal. Heart sounds not distant. No midsystolic click and no opening snap. No murmur heard.    No friction rub.  Pulmonary:     Effort: Pulmonary effort is normal. No respiratory distress.     Breath sounds: Normal breath sounds. No decreased breath sounds, wheezing, rhonchi or rales.  Musculoskeletal:     Cervical back: Normal range of motion.     Right lower leg: No edema.     Left lower leg: No edema.  Skin:    General: Skin is warm and  dry.     Nails: There is no clubbing.  Neurological:     Mental Status: She is alert and oriented to person, place, and time.  Psychiatric:        Speech: Speech normal.        Behavior: Behavior normal.        Thought Content: Thought content normal.        Judgment: Judgment normal.     Wt Readings from Last 3 Encounters:  08/17/24 134 lb (60.8 kg)  08/04/24 136 lb (61.7 kg)  07/10/24 133 lb 6.4 oz (60.5 kg)     ASSESSMENT & PLAN:   History of syncope: No further episodes.  Prior episode appears to be consistent with orthostasis following waking up from sleep and standing up to use the restroom. Cardiac workup has been reassuring including echo and coronary CTA.  Evaluated by EP with no indication for PPM.  Status post ILR without evidence of symptomatic bradycardia, prolonged pauses, or high-grade AV block.  Sinus bradycardia: Query  if for exertional fatigue, near syncope, dyspnea are actually related to his symptomatic bradycardia.  Has previously demonstrated appropriate chronotropic competence.  Not on rate limiting medications.  Evaluated by EP with no indication for PPM.  Status post ILR with device interrogation showing no evidence of high-grade AV block or prolonged pauses.  Will refer to outside EP group for second opinion.  Dyspnea: Cardiac and pulmonary workup unrevealing outside of possible component of symptomatic bradycardia contributing to dyspnea.  Cardiac evaluation including echo and coronary CTA have been unrevealing.  PFTs normal.  CT chest without acute findings.  Referred to EP as outlined above.  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    PAF: Maintaining sinus rhythm.  Diagnosed by loop recorder in 06/2024 with episode lasting 1 hour and 14 minutes.  CHA2DS2-VASc at least 4 (CHF, age x 1, vascular disease, sex category).  OAC deferred by A-fib clinic.  Follow-up with A-fib clinic as  directed.  HFpEF: Euvolemic and well compensated.  Echo in 01/2024 showed normal LV diastolic function parameters and normal CVP.  Unlikely contributing to ongoing dyspnea.  Remains on low-dose furosemide  20 mg.  PSVT: Quiescent on loop recorder.  Not on beta-blocker or nondihydropyridine calcium channel blocker given baseline bradycardia.  OSA: Management per sleep medicine.    Disposition: F/u with Dr. Darron or an APP in 5 months and EP as directed.   Medication Adjustments/Labs and Tests Ordered: Current medicines are reviewed at length with the patient today.  Concerns regarding medicines are outlined above. Medication changes, Labs and Tests ordered today are summarized above and listed in the Patient Instructions accessible in Encounters.   Signed, Bernardino Bring, PA-C 08/17/2024 12:45 PM     [facility] [place] [address] [place], [place] [address] [phone]

## 2024-08-17 ENCOUNTER — Ambulatory Visit: Attending: Physician Assistant | Admitting: Physician Assistant

## 2024-08-17 ENCOUNTER — Encounter: Payer: Self-pay | Admitting: Physician Assistant

## 2024-08-17 VITALS — BP 114/72 | HR 40 | Ht 66.0 in | Wt 134.0 lb

## 2024-08-17 DIAGNOSIS — I5032 Chronic diastolic (congestive) heart failure: Secondary | ICD-10-CM

## 2024-08-17 DIAGNOSIS — I471 Supraventricular tachycardia, unspecified: Secondary | ICD-10-CM | POA: Diagnosis not present

## 2024-08-17 DIAGNOSIS — R0600 Dyspnea, unspecified: Secondary | ICD-10-CM

## 2024-08-17 DIAGNOSIS — R001 Bradycardia, unspecified: Secondary | ICD-10-CM

## 2024-08-17 DIAGNOSIS — I2510 Atherosclerotic heart disease of native coronary artery without angina pectoris: Secondary | ICD-10-CM | POA: Diagnosis not present

## 2024-08-17 DIAGNOSIS — Z87898 Personal history of other specified conditions: Secondary | ICD-10-CM

## 2024-08-17 DIAGNOSIS — I480 Paroxysmal atrial fibrillation: Secondary | ICD-10-CM | POA: Diagnosis not present

## 2024-08-17 DIAGNOSIS — G4733 Obstructive sleep apnea (adult) (pediatric): Secondary | ICD-10-CM | POA: Diagnosis not present

## 2024-08-17 DIAGNOSIS — R5382 Chronic fatigue, unspecified: Secondary | ICD-10-CM

## 2024-08-17 NOTE — Patient Instructions (Addendum)
 Medication Instructions:  Your physician recommends that you continue on your current medications as directed. Please refer to the Current Medication list given to you today.   *If you need a refill on your cardiac medications before your next appointment, please call your pharmacy*  Lab Work: None ordered at this time   Follow-Up: At [facility], you and your health needs are our priority.  As part of our continuing mission to provide you with exceptional heart care, our providers are all part of one team.  This team includes your primary Cardiologist (physician) and Advanced Practice Providers or APPs (Physician Assistants and Nurse Practitioners) who all work together to provide you with the care you need, when you need it.  Your next appointment:   5 month(s)  Provider:   You may see Deatrice Cage, MD or Bernardino Bring, PA-C  Please send us  a message for the [facility] referral

## 2024-08-28 LAB — GENECONNECT MOLECULAR SCREEN: Genetic Analysis Overall Interpretation: NEGATIVE

## 2024-08-30 ENCOUNTER — Ambulatory Visit: Attending: Cardiology

## 2024-08-30 DIAGNOSIS — I480 Paroxysmal atrial fibrillation: Secondary | ICD-10-CM | POA: Diagnosis not present

## 2024-08-31 ENCOUNTER — Encounter

## 2024-08-31 LAB — CUP PACEART REMOTE DEVICE CHECK
Date Time Interrogation Session: 20251129233803
Implantable Pulse Generator Implant Date: 20250527

## 2024-09-03 ENCOUNTER — Encounter: Payer: Self-pay | Admitting: Internal Medicine

## 2024-09-03 ENCOUNTER — Ambulatory Visit: Admitting: Internal Medicine

## 2024-09-03 VITALS — BP 120/60 | HR 53 | Temp 98.0°F | Ht 65.0 in | Wt 133.0 lb

## 2024-09-03 DIAGNOSIS — I4891 Unspecified atrial fibrillation: Secondary | ICD-10-CM | POA: Diagnosis not present

## 2024-09-03 DIAGNOSIS — R918 Other nonspecific abnormal finding of lung field: Secondary | ICD-10-CM | POA: Diagnosis not present

## 2024-09-03 DIAGNOSIS — R001 Bradycardia, unspecified: Secondary | ICD-10-CM | POA: Diagnosis not present

## 2024-09-03 DIAGNOSIS — G4733 Obstructive sleep apnea (adult) (pediatric): Secondary | ICD-10-CM | POA: Diagnosis not present

## 2024-09-03 DIAGNOSIS — Z87891 Personal history of nicotine dependence: Secondary | ICD-10-CM | POA: Diagnosis not present

## 2024-09-03 NOTE — Progress Notes (Signed)
 Name: Morgan Herrera MRN: [id number] DOB: 11/28/1950       STUDIES:    7.24.19 CT chest Independently reviewed by Me B/l emphysema LUL lung nodule  Home sleep study on 03/13/2022 showed moderate obstructive sleep apnea, AHI 16.2/hour with SPO2 low 76% (average 91%).  We discussed sleep study results today along with possible treatment options.   -Therapy with oral appliance along with wedge pillow  CC Follow-up OSA Follow-up abnormal CT chest  HISTORY OF PRESENT ILLNESS: HST results reviewed in detail with patient patient with mild OSA with AHI of 9 however with significant hypoxia less than 14 minutes 88% I strongly recommend starting auto CPAP therapy at this time with nasal pillows  Patient was initially referred to us  from cardiology for A-fib Previous history of sleep apnea AHI of 16  She has new symptoms of shortness of breath with activity Follow-up cardiology assessment showed abnormal stress test further workup is pending-has been to [facility] cardiology for further evaluation and assessment   Patient is a former smoker 1 pack a day for 30 years quit in 2000 No history of asthma or COPD in the past Previous pulmonary function test noted to be within normal limits She has no allergic rhinitis no significant allergies no triggers Lives with a cat Pulmonary function testing October 2025 within normal limits No evidence of obstructive or restrictive lung disease  CT of the chest OCT 2025   No significant changes since 2019 No evidence of malignancy   BP 120/60   Pulse (!) 53   Temp 98 F (36.7 C)   Ht 5' 5 (1.651 m)   Wt 133 lb (60.3 kg)   SpO2 97%   BMI 22.13 kg/m   Alert awake no apparent distress No wheezing no rhonchi S1-S2 no murmurs Pulses intact No focal deficits   CBC    Component Value Date/Time   WBC 6.0 02/09/2024 0021   RBC 4.58 02/09/2024 0021   HGB 12.8 02/09/2024 0021   HGB 12.5 12/24/2019 0000   HCT 39.9 02/09/2024 0021   HCT  38.4 12/24/2019 0000   PLT 238 02/09/2024 0021   MCV 87.1 02/09/2024 0021   MCV 88 12/24/2019 0000   MCH 27.9 02/09/2024 0021   MCHC 32.1 02/09/2024 0021   RDW 13.4 02/09/2024 0021   RDW 13.3 12/24/2019 0000   LYMPHSABS 1,912 05/10/2023 1620   LYMPHSABS 1.1 12/24/2019 0000   MONOABS 0.6 08/07/2021 1010   EOSABS 213 05/10/2023 1620   EOSABS 0.2 12/24/2019 0000   BASOSABS 111 05/10/2023 1620   BASOSABS 0.1 12/24/2019 0000      Latest Ref Rng & Units 02/10/2024   12:13 PM 02/09/2024   12:21 AM 08/10/2022   11:30 AM  BMP  Glucose 70 - 99 mg/dL 87  871  897   BUN 8 - 27 mg/dL 22  25  23    Creatinine 0.57 - 1.00 mg/dL 9.21  9.20  9.20   BUN/Creat Ratio 12 - 28 28     Sodium 134 - 144 mmol/L 141  137  140   Potassium 3.5 - 5.2 mmol/L 4.7  3.5  4.1   Chloride 96 - 106 mmol/L 102  101  105   CO2 20 - 29 mmol/L 25  27  28    Calcium 8.7 - 10.3 mg/dL 9.8  9.2  89.8          ASSESSMENT / PLAN:  73 year old pleasant white female seen today for follow-up assessment for abnormal  CT chest with left upper lobe scarring which has been stable over the last 6 years and follow-up assessment for underlying sleep apnea previous history of AHI of 16 recent HST shows AHI of 9 with hypoxia in the setting of ongoing shortness of breath mostly with exertion   Abnormal CT chest No significant findings to suggest malignancy Stable pleural-based scarring in the left upper lobe over the last 6 years No further testing needed  Assessment of OSA Recommend starting auto CPAP 4-8 Nasal pillows AirFit P 30I  Cardiology assessment Intermittent A-fib with bradycardia Patient to be referred to [facility] cardiology     MEDICATION ADJUSTMENTS/LABS AND TESTS ORDERED: Start Auto CPAP 4-8 Nasal pillows Follow-up [facility] cardiology    CURRENT MEDICATIONS REVIEWED AT LENGTH WITH PATIENT TODAY   Patient  satisfied with Plan of action and management. All questions answered   Follow up 3 months   I spent a  total of 46 minutes dedicated to the care of this patient on the date of this encounter to include pre-visit review of records, face-to-face time with the patient discussing conditions above, post visit ordering of testing, clinical documentation with the electronic health record, making appropriate referrals as documented, and communicating necessary information to the patient's healthcare team.    The Patient requires high complexity decision making for assessment and support, frequent evaluation and titration of therapies, application of advanced monitoring technologies and extensive interpretation of multiple databases.  Patient satisfied with Plan of action and management. All questions answered    Nickolas Alm Cellar, M.D.  [facility]  Medical Director [facility] Medical Director [facility] Cardio-Pulmonary Department

## 2024-09-03 NOTE — Progress Notes (Signed)
 Remote Loop Recorder Transmission

## 2024-09-03 NOTE — Patient Instructions (Signed)
 Recommend starting therapy for sleep apnea start auto CPAP 4-8 Try AirFit P30 I nasal pillows mask  Follow-up with [facility] cardiology

## 2024-09-14 ENCOUNTER — Telehealth: Payer: Self-pay

## 2024-09-14 NOTE — Telephone Encounter (Signed)
 Alert remote transmission: AF     (ILR implanted for syncope).  4 AF/AFL events 12/14, longest duration 1hr , burden 0.1%, no OAC per EPIC - route to triage Presenting rhythm SB  Patient last seen by AF clinic in September 2025.  Determination not to put on OAC at that time due to short, sporadic event (hx longest duration 1 hour).  Patient continues with that trend; however, has recent AF event flair on 12/14.    Will forward to AF clinic for follow up.

## 2024-09-18 ENCOUNTER — Ambulatory Visit: Payer: Self-pay | Admitting: Cardiology

## 2024-09-23 ENCOUNTER — Ambulatory Visit

## 2024-09-23 ENCOUNTER — Encounter: Payer: Self-pay | Admitting: Podiatry

## 2024-09-23 ENCOUNTER — Ambulatory Visit: Admitting: Podiatry

## 2024-09-23 DIAGNOSIS — M7751 Other enthesopathy of right foot: Secondary | ICD-10-CM | POA: Diagnosis not present

## 2024-09-23 NOTE — Progress Notes (Signed)
 Subjective:   Patient ID: Morgan Herrera, female   DOB: 73 y.o.   MRN: [id number]   HPI Patient states over the last approximate 6 to 8 weeks she has developed a prominence on top of the right foot that has become irritated.  States doing great from the surgery she has done   ROS      Objective:  Physical Exam  Neuro vascular status intact excellent healing incision site with a prominence around the proximal portion of the incision with what appears to be reactivity around the proximal pin with irritation and pain     Assessment:  Inflammatory process of the right first metatarsal with history of pin fixation     Plan:  H&P reviewed due to the intensity of discomfort failure to respond to padding and shoe gear modifications I recommended pin removal.  Patient wants this done and at this time I allowed her to read and signed consent form for correction.  Went over recovery and she had will be scheduled in January to have this procedure performed  X-rays indicate it does appear to be around the proximal pin with no indication of pathology joint space looks good currently

## 2024-09-30 ENCOUNTER — Ambulatory Visit

## 2024-09-30 DIAGNOSIS — I480 Paroxysmal atrial fibrillation: Secondary | ICD-10-CM | POA: Diagnosis not present

## 2024-10-01 ENCOUNTER — Encounter

## 2024-10-01 LAB — CUP PACEART REMOTE DEVICE CHECK
Date Time Interrogation Session: 20251230233117
Implantable Pulse Generator Implant Date: 20250527

## 2024-10-04 ENCOUNTER — Ambulatory Visit: Payer: Self-pay | Admitting: Cardiology

## 2024-10-05 ENCOUNTER — Encounter: Payer: Self-pay | Admitting: Internal Medicine

## 2024-10-05 NOTE — Progress Notes (Signed)
 Remote Loop Recorder Transmission

## 2024-10-06 NOTE — Telephone Encounter (Signed)
 I have spoke with Morgan Herrera with [facility] there was 1 1/2 weeks where their fax machine was messed up. He stated he didn't realize they were missing some of the faxes that was sent to them. Morgan Herrera is having Morgan Herrera to be processed urgently due to order being sent 09/08/24

## 2024-10-08 ENCOUNTER — Telehealth: Payer: Self-pay | Admitting: Urology

## 2024-10-08 NOTE — Telephone Encounter (Signed)
 OFFICE SURGERY  DOS - 10/12/24  79319 - REMOVAL OF PIN  HTA EFFECTIVE DATE - 10/01/24  SPOKE WITH CHEMISE B. WITH [facility] AND SHE STATED FOR CPT CODE 79319 NO PRIOR AUTH IS REQUIRED.    CALL REF # CHEMISE B. 10/08/24 AT 8:26 AM EST

## 2024-10-12 ENCOUNTER — Ambulatory Visit: Admitting: Podiatry

## 2024-10-12 ENCOUNTER — Encounter: Payer: Self-pay | Admitting: Podiatry

## 2024-10-12 VITALS — BP 140/57 | HR 45 | Temp 98.6°F

## 2024-10-12 DIAGNOSIS — Z472 Encounter for removal of internal fixation device: Secondary | ICD-10-CM | POA: Diagnosis not present

## 2024-10-12 NOTE — Progress Notes (Signed)
 Subjective:   Patient ID: Morgan Herrera, female   DOB: 74 y.o.   MRN: [id number]   HPI Patient presents stating that she is here to get rid of painful fixation on the top of the right foot which occurred over the last few months   ROS      Objective:  Physical Exam  Neurovascular status intact with prominence of the right first metatarsal shaft consistent with proximal pattern which is probably developed inflammatory process allergic reaction pain     Assessment:  Abnormal pin position right first metatarsal     Plan:  Patient is anesthetized with 60 mg like Marcaine mixture patient is brought to the OR and sterile prep applied and then foot exsanguinated utilizing Ace wrap and tourniquet inflated to 250 mmHg.  Following procedures were performed removal of pin dorsal right attention was directed dorsal right where an approximate 2 cm incision was made over the offending prominence.  The incision was deepened to subcutaneous tissue there was found to be a small inflammatory cyst that formed on the area this was removed in toto tendon was deflected to the side and then the pin was identified and removed in toto.  Wound flushed copious Garamycin solution no further pathology noted and was sutured with 5-0 nylon.  Sterile dressing applied to the right foot patient's tourniquet released capillary fill noted be immediate and patient left the OR satisfactory condition and will be seen back 2 weeks earlier if needed

## 2024-10-13 NOTE — Progress Notes (Unsigned)
 "  Virtual Visit via Video Note:   Consent was obtained for video visit:  Yes.   Answered questions that patient had about telehealth interaction:  Yes.   I discussed the limitations, risks, security and privacy concerns of performing an evaluation and management service by telemedicine. I also discussed with the patient that there may be a patient responsible charge related to this service. The patient expressed understanding and agreed to proceed.  Pt location: Home Physician Location: office Name of referring provider:  Gretta Comer POUR, NP I connected with Morgan Herrera at patients initiation/request on 10/15/2024 at  8:50 AM EST by video enabled telemedicine application and verified that I am speaking with the correct person using two identifiers. Pt MRN:  [id number] Pt DOB:  [birth year] Video Participants:  Morgan Herrera Kendra;    Assessment/Plan:  Multiple sclerosis - stable Cervicogenic/tension-type headache, manageable   Continue D3 2000 IU daily Follow up 1 year   Subjective:  Morgan Herrera is a 74 year old right-handed woman with multiple sclerosis, polymyalgia rheumatica and migraine who follows up for multiple sclerosis.   UPDATE: DMT:  None Current medications:  B12 500mg , gabapentin  100mg  at bedtime (no longer takes it); Lexapro  10mg  daily; Ambien ; D3 2000 IU daily.     She has been experiencing dyspnea and lightheadedness.  Last May, she got up in middle of night to go to the bathroom and passed out.  Went to the ED and diagnosed with concussion.  However, found to be bradycardic (40s resting) and blood pressure has been low.  Diagnosed with chronotropic incompetence.  Incidentally, also diagnosed with paroxysmal atrial fibrillation.  She is going to have a pacemaker implanted at the end of the month.   Vision: Chronic right sided peripheral vision loss.   Motor:  No concerns Sensory:  No concerns Gait:  Sometimes may feel briefly off balance.   Bowel/Bladder:   No concerns Fatigue:  Sometimes has episodes of lethargy. Cognition:  No concerns Mood:  Some stress.   Cervicogenic/tension-type headaches: They are mild to moderate occipital/temporal (right sided or may be bilateral) pressure headaches occurring once a week.  Does have neck pain.  Does yoga with neck stretches 3 times a week.   HISTORY: She was diagnosed with MS at age 60, when she presented with bilateral optic neuritis, right worse than left.  She was treated with IV steroids at that time..  In hindsight, she recalls brief episodes from her past.  In her late-20s, she had a 3 week episode of dizziness and ataxia.  She had brief episodes of limb paresthesias lasting a couple of days.   Work up included MRIs.  MRIs of the brain have shown lesions suspicious for MS.  Prior cervical MRI reportedly normal.   She never had a relapse and therefore never required further IV steroids.  However, she was diagnosed with polymyalgia rheumatica several years ago, after experiencing severe joint pain and stiffness.     She occasionally has fatigue once in a while, sometimes lasting for 2 weeks.   She does not exhibit Lhermitte's sign.  She reports burning and aching on the bottom of her feet when she walks for prolonged period.   She works part-time as a catering manager.  For the past couple of years, she reports word-finding difficulty.  Sometimes it is difficult to concentrate.  It has almost affected her work at times.  She has a BA in English.  She underwent neuropsychological testing in July 2017, which revealed  anxiety and depression but no cognitive impairment.   Prior disease modifying drugs:  Avonex (tired of injections), Tecfidera  (nausea, flushing and unable to take aspirin ), Aubagio  (effective, tolerated)   09/06/2015:  MRI BRAIN W WO: chronic non-enhancing T2/FLAIR hyperintensities in the periventricular white matter, some demonstrating Dawsons fingers, with mild corpus callosum involvement,  which appears stable compared to 2012 and similar to imaging from 2005.   10/09/2016:  MRI BRAIN W WO:  stable. 12/28/2017:  MRI BRAIN W WO:  stable compared to prior imaging from 2016. 04/07/2020 MRI BRAIN W WO:  Stable mild white matter disease when compared with 2019. 04/07/2020 MRI CERVICAL SPINE W WO:  1.  Normal MRI of the cord.  2.  Cervical spine degeneration with foraminal narrowings as described.  Diffusely patent spinal canal. 09/05/2021 MRI BRAIN W WO:  unchanged distribution of chronic demyelinating lesions.  09/04/2022 MRI BRAIN W WO:  stable compared to prior MRI from 09/05/2021 09/30/2023 MRI BRAIN W WO:  Redemonstrated T2 hyperintense lesion in the periventricular white matter, compatible with the patient's history of multiple sclerosis. No new lesion is seen. No evidence of active demyelination.  Past Medical History: Past Medical History:  Diagnosis Date   (HFpEF) heart failure with preserved ejection fraction (HCC)    a. 03/2018 Echo: EF 55-60%, no rwma. Mod MR. Nl PASP; b. 03/2019 Echo: EF 60-65%, ? DD, mod MR.   Acute CHF (congestive heart failure) (HCC) 04/23/2018   Arthritis slight   Cataract    Chest pain    a. 03/2018 Ex MV: rest only imaging showed ant wall perfusion defect. No stress imaging as pt developed hypotension w/ exercise; b. 05/2018 Cath: Nl cors w/ sluggish flow in the LAD suggestive of endothelial dysfxn. EF 55-65%.   Full body hives 09/12/2020   Loud snoring 02/02/2022   Migraine    Multiple sclerosis    Neuromuscular disorder (HCC) 1990 ?   PMR (polymyalgia rheumatica)    PND (post-nasal drip) 04/16/2022   Pulmonary nodule    a. 03/2018 CTA Chest: 9mm small spiculated nodular density @ the L lung apex. Rec f/u in 3 mos.   Puncture wound 03/04/2020   Relapsing remitting multiple sclerosis 12/13/2015   Shortness of breath 04/30/2018   Sleep apnea    Syncope and collapse 02/08/24    Medications: Outpatient Encounter Medications as of 10/15/2024   Medication Sig   calcium carbonate (OS-CAL - DOSED IN MG OF ELEMENTAL CALCIUM) 1250 (500 Ca) MG tablet Take 1 tablet by mouth daily.   cholecalciferol (VITAMIN D ) 1000 units tablet Take 2,000 Units by mouth daily.   cyanocobalamin  500 MCG tablet Take 500 mcg by mouth daily.   escitalopram  (LEXAPRO ) 10 MG tablet TAKE 1 TABLET BY MOUTH DAILY FOR DEPRESSION   furosemide  (LASIX ) 20 MG tablet TAKE 1 TABLET BY MOUTH EVERY DAY   Magnesium 250 MG TABS Take by mouth.   MELATONIN GUMMIES PO Takes 1 gummy by mouth at night for sleep   Omega-3 Fatty Acids (FISH OIL PEARLS PO) Take 1,400 mg by mouth daily.   traZODone  (DESYREL ) 50 MG tablet TAKE 1 TABLET BY MOUTH EVERY DAY AT BEDTIME FOR SLEEP   No facility-administered encounter medications on file as of 10/15/2024.    Allergies: Allergies[1]  Family History: Family History  Problem Relation Age of Onset   Heart disease Mother    Hypertension Mother    Parkinson's disease Mother    Parkinsonism Mother    Heart failure Mother  Cancer Maternal Grandmother        breast   Breast cancer Maternal Grandmother    Asthma Father     Observations/Objective:   No acute distress.  Alert and oriented.  Speech fluent and not dysarthric.  Language intact.  Eyes orthophoric on primary gaze.  Face symmetric.   Follow up Instructions:      -I discussed the assessment and treatment plan with the patient. The patient was provided an opportunity to ask questions and all were answered. The patient agreed with the plan and demonstrated an understanding of the instructions.   The patient was advised to call back or seek an in-person evaluation if the symptoms worsen or if the condition fails to improve as anticipated.   Juliene Lamar Dunnings, DO   CC: Comer Gaskins, NP          [1] No Known Allergies  "

## 2024-10-15 ENCOUNTER — Encounter: Payer: Self-pay | Admitting: Neurology

## 2024-10-15 ENCOUNTER — Telehealth: Payer: PPO | Admitting: Neurology

## 2024-10-15 DIAGNOSIS — G35D Multiple sclerosis, unspecified: Secondary | ICD-10-CM | POA: Diagnosis not present

## 2024-10-26 ENCOUNTER — Ambulatory Visit: Admitting: Podiatry

## 2024-10-28 ENCOUNTER — Ambulatory Visit

## 2024-10-28 ENCOUNTER — Encounter: Payer: Self-pay | Admitting: Podiatry

## 2024-10-28 ENCOUNTER — Ambulatory Visit: Admitting: Podiatry

## 2024-10-28 DIAGNOSIS — Z9889 Other specified postprocedural states: Secondary | ICD-10-CM

## 2024-10-28 NOTE — Progress Notes (Signed)
 Subjective:   Patient ID: Morgan Herrera, female   DOB: 74 y.o.   MRN: [id number]   HPI Patient doing well with surgery   ROS      Objective:  Physical Exam  Neurovascular status intact incision sites healing well wound edges well coapted     Assessment:  Doing well removal of pin from right first metatarsal     Plan:  X-ray dated today indicated satisfactory move Lieutenant Stitches removed bandage applied patient is discharged reappoint as symptoms indicate good range of motion noted

## 2024-10-30 ENCOUNTER — Encounter: Payer: Self-pay | Admitting: Cardiology

## 2024-10-31 ENCOUNTER — Ambulatory Visit: Attending: Cardiology

## 2024-10-31 DIAGNOSIS — R55 Syncope and collapse: Secondary | ICD-10-CM

## 2024-11-02 ENCOUNTER — Encounter

## 2024-11-02 LAB — CUP PACEART REMOTE DEVICE CHECK
Date Time Interrogation Session: 20260130232811
Implantable Pulse Generator Implant Date: 20250527

## 2024-11-06 NOTE — Progress Notes (Signed)
 Remote Loop Recorder Transmission

## 2024-11-30 ENCOUNTER — Ambulatory Visit: Payer: PPO

## 2024-12-01 ENCOUNTER — Ambulatory Visit

## 2024-12-15 ENCOUNTER — Ambulatory Visit: Admitting: Internal Medicine

## 2025-01-01 ENCOUNTER — Ambulatory Visit

## 2025-01-19 ENCOUNTER — Ambulatory Visit: Admitting: Physician Assistant

## 2025-02-01 ENCOUNTER — Ambulatory Visit

## 2025-03-04 ENCOUNTER — Ambulatory Visit

## 2025-04-04 ENCOUNTER — Ambulatory Visit

## 2025-05-05 ENCOUNTER — Ambulatory Visit

## 2025-06-05 ENCOUNTER — Ambulatory Visit

## 2025-07-06 ENCOUNTER — Ambulatory Visit

## 2025-08-06 ENCOUNTER — Ambulatory Visit

## 2025-09-06 ENCOUNTER — Ambulatory Visit

## 2025-10-07 ENCOUNTER — Ambulatory Visit

## 2025-10-15 ENCOUNTER — Ambulatory Visit: Payer: Self-pay | Admitting: Neurology
